# Patient Record
Sex: Female | Born: 1990 | Race: White | Hispanic: No | Marital: Married | Smoking: Never smoker
Health system: Southern US, Community
[De-identification: ages and names within clinical notes are randomized; demographics above are authoritative.]

## PROBLEM LIST (undated history)

## (undated) ENCOUNTER — Inpatient Hospital Stay: Payer: Self-pay

## (undated) DIAGNOSIS — F419 Anxiety disorder, unspecified: Secondary | ICD-10-CM

## (undated) DIAGNOSIS — F32A Depression, unspecified: Secondary | ICD-10-CM

## (undated) DIAGNOSIS — F329 Major depressive disorder, single episode, unspecified: Secondary | ICD-10-CM

## (undated) HISTORY — PX: TUBAL LIGATION: SHX77

## (undated) HISTORY — PX: CHOLECYSTECTOMY: SHX55

## (undated) HISTORY — PX: TONSILLECTOMY: SUR1361

## (undated) HISTORY — PX: WISDOM TOOTH EXTRACTION: SHX21

---
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
## 2010-04-27 ENCOUNTER — Emergency Department: Payer: Self-pay | Admitting: Unknown Physician Specialty

## 2010-09-06 ENCOUNTER — Emergency Department: Payer: Self-pay | Admitting: Emergency Medicine

## 2010-09-08 ENCOUNTER — Emergency Department: Payer: Self-pay | Admitting: Emergency Medicine

## 2010-09-19 ENCOUNTER — Emergency Department: Payer: Self-pay | Admitting: Emergency Medicine

## 2010-12-07 ENCOUNTER — Emergency Department: Payer: Self-pay | Admitting: Emergency Medicine

## 2011-01-08 ENCOUNTER — Emergency Department: Payer: Self-pay | Admitting: Unknown Physician Specialty

## 2011-01-29 NOTE — Op Note (Signed)
April Tran, April Tran             ACCOUNT NO.:  [id number]   MEDICAL RECORD NO.:  [id number]          PATIENT TYPE:  AMB   LOCATION:  SDS                          FACILITY:  [facility]   PHYSICIAN:  Jon Gills, M.D.  DATE OF BIRTH:  07/19/1991   DATE OF PROCEDURE:  09/20/2007  DATE OF DISCHARGE:  08/25/2007                               OPERATIVE REPORT   PREOPERATIVE DIAGNOSIS:  Abdominal pain.   POSTOPERATIVE DIAGNOSIS:  Abdominal pain.   OPERATION:  Upper GI endoscopy with biopsy.   SURGEON:  Jon Gills, M.D.   ASSISTANT:  None.   DESCRIPTION OF FINDINGS:  Following informed written consent, the  patient was taken to the operating room and placed under general  anesthesia with continuous cardiopulmonary monitoring.  She remained in  the supine position and the Pentax endoscope was inserted by mouth and  advanced without difficulty.  A competent lower esophageal sphincter was  identified 40 cm from the incisors.  There was no visual evidence for  esophagitis, gastritis, duodenitis or peptic ulcer disease.  A solitary  gastric biopsy was negative for Helicobacter by CLO testing.  Multiple  esophageal, gastric and duodenal biopsies were histologically normal.  The endoscope was gradually withdrawn and the patient was awakened and  taken to the recovery room in satisfactory condition.  She will be  released later today to the care of her family.   DESCRIPTION OF TECHNICAL PROCEDURES USED:  Pentax upper GI endoscope  with cold biopsy forceps.   DESCRIPTION OF SPECIMENS REMOVED:  Esophagus x3 in formalin, gastric x1  for CLO testing, gastric x3 in formalin and duodenum x3 in formalin.           ______________________________  Jon Gills, M.D.     [facility]  D:  09/20/2007  T:  09/20/2007  Job:  [id number]   cc:   Gillis Ends, MD

## 2011-03-01 ENCOUNTER — Emergency Department: Payer: Self-pay | Admitting: Emergency Medicine

## 2011-03-07 ENCOUNTER — Emergency Department: Payer: Self-pay | Admitting: Emergency Medicine

## 2011-05-09 ENCOUNTER — Emergency Department: Payer: Self-pay | Admitting: Emergency Medicine

## 2011-06-08 ENCOUNTER — Emergency Department: Payer: Self-pay | Admitting: Emergency Medicine

## 2011-06-21 LAB — CBC
HCT: 39.8
Hemoglobin: 13.5
MCHC: 33.9
MCV: 82.9
Platelets: 177
RBC: 4.8
RDW: 13.8
WBC: 6.4

## 2011-06-21 LAB — CLOTEST (H. PYLORI), BIOPSY: Helicobacter screen: NEGATIVE

## 2011-07-07 ENCOUNTER — Emergency Department: Payer: Self-pay | Admitting: Internal Medicine

## 2011-09-25 ENCOUNTER — Emergency Department: Payer: Self-pay | Admitting: Emergency Medicine

## 2011-10-02 ENCOUNTER — Emergency Department: Payer: Self-pay | Admitting: Emergency Medicine

## 2011-11-18 ENCOUNTER — Emergency Department: Payer: Self-pay | Admitting: Emergency Medicine

## 2011-11-18 LAB — CBC
HGB: 13.7 g/dL (ref 12.0–16.0)
MCV: 90 fL (ref 80–100)
Platelet: 128 10*3/uL — ABNORMAL LOW (ref 150–440)
RBC: 4.53 10*6/uL (ref 3.80–5.20)
WBC: 5.2 10*3/uL (ref 3.6–11.0)

## 2011-11-18 LAB — COMPREHENSIVE METABOLIC PANEL
Albumin: 3.7 g/dL (ref 3.4–5.0)
Alkaline Phosphatase: 48 U/L — ABNORMAL LOW (ref 50–136)
Anion Gap: 10 (ref 7–16)
BUN: 15 mg/dL (ref 7–18)
Glucose: 86 mg/dL (ref 65–99)
SGOT(AST): 29 U/L (ref 15–37)
SGPT (ALT): 38 U/L
Total Protein: 7.3 g/dL (ref 6.4–8.2)

## 2011-11-18 LAB — URINALYSIS, COMPLETE
Ketone: NEGATIVE
Nitrite: NEGATIVE
Ph: 5 (ref 4.5–8.0)
Protein: NEGATIVE
Specific Gravity: 1.027 (ref 1.003–1.030)

## 2012-02-25 ENCOUNTER — Encounter (HOSPITAL_COMMUNITY): Payer: Self-pay | Admitting: *Deleted

## 2012-02-25 ENCOUNTER — Emergency Department (HOSPITAL_COMMUNITY)
Admission: EM | Admit: 2012-02-25 | Discharge: 2012-02-26 | Disposition: A | Discharge status: Home / Self Care | Payer: Medicaid Other | Attending: Emergency Medicine | Admitting: Emergency Medicine

## 2012-02-25 DIAGNOSIS — Z3490 Encounter for supervision of normal pregnancy, unspecified, unspecified trimester: Secondary | ICD-10-CM

## 2012-02-25 DIAGNOSIS — Z3201 Encounter for pregnancy test, result positive: Secondary | ICD-10-CM | POA: Insufficient documentation

## 2012-02-25 DIAGNOSIS — R109 Unspecified abdominal pain: Secondary | ICD-10-CM | POA: Insufficient documentation

## 2012-02-25 DIAGNOSIS — R110 Nausea: Secondary | ICD-10-CM | POA: Insufficient documentation

## 2012-02-25 LAB — POCT PREGNANCY, URINE: Preg Test, Ur: POSITIVE — AB

## 2012-02-25 NOTE — ED Notes (Signed)
Pt worried about pregnancy, pt states she has been nauseated, and having abdominal pain for 3 weeks. Pt states last known period was 2 months ago. Pt states also tired.

## 2012-02-26 ENCOUNTER — Emergency Department (HOSPITAL_COMMUNITY): Payer: Medicaid Other

## 2012-02-26 LAB — CBC
MCH: 29.8 pg (ref 26.0–34.0)
MCHC: 34.9 g/dL (ref 30.0–36.0)
MCV: 85.5 fL (ref 78.0–100.0)
Platelets: 147 10*3/uL — ABNORMAL LOW (ref 150–400)
RDW: 12.3 % (ref 11.5–15.5)

## 2012-02-26 LAB — POCT I-STAT, CHEM 8
Calcium, Ion: 1.23 mmol/L (ref 1.12–1.32)
HCT: 41 % (ref 36.0–46.0)
TCO2: 24 mmol/L (ref 0–100)

## 2012-02-26 LAB — HCG, QUANTITATIVE, PREGNANCY: hCG, Beta Chain, Quant, S: 29783 m[IU]/mL — ABNORMAL HIGH (ref <5)

## 2012-02-26 MED ORDER — SODIUM CHLORIDE 0.9 % IV BOLUS (SEPSIS)
1000.0000 mL | Freq: Once | INTRAVENOUS | Status: AC
  Administered 2012-02-26: 1000 mL via INTRAVENOUS

## 2012-02-26 NOTE — Discharge Instructions (Signed)
Start prenatal vitamins as discussed. Call in the morning to establish followup with OB/GYN.  ABCs of Pregnancy  A  Antepartum care is very important. Be sure you see your doctor and get prenatal care as soon as you think you are pregnant. At this time, you will be tested for infection, genetic abnormalities and potential problems with you and the pregnancy. This is the time to discuss diet, exercise, work, medications, labor, pain medication during labor and the possibility of a cesarean delivery. Ask any questions that may concern you. It is important to see your doctor regularly throughout your pregnancy. Avoid exposure to toxic substances and chemicals - such as cleaning solvents, lead and mercury, some insecticides, and paint. Pregnant women should avoid exposure to paint fumes, and fumes that cause you to feel ill, dizzy or faint. When possible, it is a good idea to have a pre-pregnancy consultation with your caregiver to begin some important recommendations your caregiver suggests such as, taking folic acid, exercising, quitting smoking, avoiding alcoholic beverages, etc.  B  Breastfeeding is the healthiest choice for both you and your baby. It has many nutritional benefits for the baby and health benefits for the mother. It also creates a very tight and loving bond between the baby and mother. Talk to your doctor, your family and friends, and your employer about how you choose to feed your baby and how they can support you in your decision. Not all birth defects can be prevented, but a woman can take actions that may increase her chance of having a healthy baby. Many birth defects happen very early in pregnancy, sometimes before a woman even knows she is pregnant. Birth defects or abnormalities of any child in your or the father's family should be discussed with your caregiver. Get a good support bra as your breast size changes. Wear it especially when you exercise and when nursing.  C  Celebrate the  news of your pregnancy with the your spouse/father and family. Childbirth classes are helpful to take for you and the spouse/father because it helps to understand what happens during the pregnancy, labor and delivery. Cesarean delivery should be discussed with your doctor so you are prepared for that possibility. The pros and cons of circumcision if it is a boy, should be discussed with your pediatrician. Cigarette smoking during pregnancy can result in low birth weight babies. It has been associated with infertility, miscarriages, tubal pregnancies, infant death (mortality) and poor health (morbidity) in childhood. Additionally, cigarette smoking may cause long-term learning disabilities. If you smoke, you should try to quit before getting pregnant and not smoke during the pregnancy. Secondary smoke may also harm a mother and her developing baby. It is a good idea to ask people to stop smoking around you during your pregnancy and after the baby is born. Extra calcium is necessary when you are pregnant and is found in your prenatal vitamin, in dairy products, green leafy vegetables and in calcium supplements.  D  A healthy diet according to your current weight and height, along with vitamins and mineral supplements should be discussed with your caregiver. Domestic abuse or violence should be made known to your doctor right away to get the situation corrected. Drink more water when you exercise to keep hydrated. Discomfort of your back and legs usually develops and progresses from the middle of the second trimester through to delivery of the baby. This is because of the enlarging baby and uterus, which may also affect your balance. Do not  take illegal drugs. Illegal drugs can seriously harm the baby and you. Drink extra fluids (water is best) throughout pregnancy to help your body keep up with the increases in your blood volume. Drink at least 6 to 8 glasses of water, fruit juice, or milk each day. A good way to  know you are drinking enough fluid is when your urine looks almost like clear water or is very light yellow.  E  Eat healthy to get the nutrients you and your unborn baby need. Your meals should include the five basic food groups. Exercise (30 minutes of light to moderate exercise a day) is important and encouraged during pregnancy, if there are no medical problems or problems with the pregnancy. Exercise that causes discomfort or dizziness should be stopped and reported to your caregiver. Emotions during pregnancy can change from being ecstatic to depression and should be understood by you, your partner and your family.  F  Fetal screening with ultrasound, amniocentesis and monitoring during pregnancy and labor is common and sometimes necessary. Take 400 micrograms of folic acid daily both before, when possible, and during the first few months of pregnancy to reduce the risk of birth defects of the brain and spine. All women who could possibly become pregnant should take a vitamin with folic acid, every day. It is also important to eat a healthy diet with fortified foods (enriched grain products, including cereals, rice, breads, and pastas) and foods with natural sources of folate (orange juice, green leafy vegetables, beans, peanuts, broccoli, asparagus, peas, and lentils). The father should be involved with all aspects of the pregnancy including, the prenatal care, childbirth classes, labor, delivery, and postpartum time. Fathers may also have emotional concerns about being a father, financial needs, and raising a family.  G  Genetic testing should be done appropriately. It is important to know your family and the father's history. If there have been problems with pregnancies or birth defects in your family, report these to your doctor. Also, genetic counselors can talk with you about the information you might need in making decisions about having a family. You can call a major medical center in your area  for help in finding a board-certified genetic counselor. Genetic testing and counseling should be done before pregnancy when possible, especially if there is a history of problems in the mother's or father's family. Certain ethnic backgrounds are more at risk for genetic defects.  H  Get familiar with the hospital where you will be having your baby. Get to know how long it takes to get there, the labor and delivery area, and the hospital procedures. Be sure your medical insurance is accepted there. Get your home ready for the baby including, clothes, the baby's room (when possible), furniture and car seat. Hand washing is important throughout the day, especially after handling raw meat and poultry, changing the baby's diaper or using the bathroom. This can help prevent the spread of many bacteria and viruses that cause infection. Your hair may become dry and thinner, but will return to normal a few weeks after the baby is born. Heartburn is a common problem that can be treated by taking antacids recommended by your caregiver, eating smaller meals 5 or 6 times a day, not drinking liquids when eating, drinking between meals and raising the head of your bed 2 to 3 inches.  I  Insurance to cover you, the baby, doctor and hospital should be reviewed so that you will be prepared to pay any costs  not covered by your insurance plan. If you do not have medical insurance, there are usually clinics and services available for you in your community. Take 30 milligrams of iron during your pregnancy as prescribed by your doctor to reduce the risk of low red blood cells (anemia) later in pregnancy. All women of childbearing age should eat a diet rich in iron.  J  There should be a joint effort for the mother, father and any other children to adapt to the pregnancy financially, emotionally, and psychologically during the pregnancy. Join a support group for moms-to-be. Or, join a class on parenting or childbirth. Have the  family participate when possible.  K  Know your limits. Let your caregiver know if you experience any of the following:  Pain of any kind.  Strong cramps.  You develop a lot of weight in a short period of time (5 pounds in 3 to 5 days).  Vaginal bleeding, leaking of amniotic fluid.  Headache, vision problems.  Dizziness, fainting, shortness of breath.  Chest pain.  Fever of 102 F (38.9 C) or higher.  Gush of clear fluid from your vagina.  Painful urination.  Domestic violence.  Irregular heartbeat (palpitations).  Rapid beating of the heart (tachycardia).  Constant feeling sick to your stomach (nauseous) and vomiting.  Trouble walking, fluid retention (edema).  Muscle weakness.  If your baby has decreased activity.  Persistent diarrhea.  Abnormal vaginal discharge.  Uterine contractions at 20-minute intervals.  Back pain that travels down your leg.  L  Learn and practice that what you eat and drink should be in moderation and healthy for you and your baby. Legal drugs such as alcohol and caffeine are important issues for pregnant women. There is no safe amount of alcohol a woman can drink while pregnant. Fetal alcohol syndrome, a disorder characterized by growth retardation, facial abnormalities, and central nervous system dysfunction, is caused by a woman's use of alcohol during pregnancy. Caffeine, found in tea, coffee, soft drinks and chocolate, should also be limited. Be sure to read labels when trying to cut down on caffeine during pregnancy. More than 200 foods, beverages, and over-the-counter medications contain caffeine and have a high salt content! There are coffees and teas that do not contain caffeine.  M  Medical conditions such as diabetes, epilepsy, and high blood pressure should be treated and kept under control before pregnancy when possible, but especially during pregnancy. Ask your caregiver about any medications that may need to be changed or adjusted during pregnancy.  If you are currently taking any medications, ask your caregiver if it is safe to take them while you are pregnant or before getting pregnant when possible. Also, be sure to discuss any herbs or vitamins you are taking. They are medicines, too! Discuss with your doctor all medications, prescribed and over-the-counter, that you are taking. During your prenatal visit, discuss the medications your doctor may give you during labor and delivery.  N  Never be afraid to ask your doctor or caregiver questions about your health, the progress of the pregnancy, family problems, stressful situations, and recommendation for a pediatrician, if you do not have one. It is better to take all precautions and discuss any questions or concerns you may have during your office visits. It is a good idea to write down your questions before you visit the doctor.  O  Over-the-counter cough and cold remedies may contain alcohol or other ingredients that should be avoided during pregnancy. Ask your caregiver about prescription,  herbs or over-the-counter medications that you are taking or may consider taking while pregnant.  P  Physical activity during pregnancy can benefit both you and your baby by lessening discomfort and fatigue, providing a sense of well-being, and increasing the likelihood of early recovery after delivery. Light to moderate exercise during pregnancy strengthens the belly (abdominal) and back muscles. This helps improve posture. Practicing yoga, walking, swimming, and cycling on a stationary bicycle are usually safe exercises for pregnant women. Avoid scuba diving, exercise at high altitudes (over 3000 feet), skiing, horseback riding, contact sports, etc. Always check with your doctor before beginning any kind of exercise, especially during pregnancy and especially if you did not exercise before getting pregnant.  Q  Queasiness, stomach upset and morning sickness are common during pregnancy. Eating a couple of  crackers or dry toast before getting out of bed. Foods that you normally love may make you feel sick to your stomach. You may need to substitute other nutritious foods. Eating 5 or 6 small meals a day instead of 3 large ones may make you feel better. Do not drink with your meals, drink between meals. Questions that you have should be written down and asked during your prenatal visits.  R  Read about and make plans to baby-proof your home. There are important tips for making your home a safer environment for your baby. Review the tips and make your home safer for you and your baby. Read food labels regarding calories, salt and fat content in the food.  S  Saunas, hot tubs, and steam rooms should be avoided while you are pregnant. Excessive high heat may be harmful during your pregnancy. Your caregiver will screen and examine you for sexually transmitted diseases and genetic disorders during your prenatal visits. Learn the signs of labor. Sexual relations while pregnant is safe unless there is a medical or pregnancy problem and your caregiver advises against it.  T  Traveling long distances should be avoided especially in the third trimester of your pregnancy. If you do have to travel out of state, be sure to take a copy of your medical records and medical insurance plan with you. You should not travel long distances without seeing your doctor first. Most airlines will not allow you to travel after 36 weeks of pregnancy. Toxoplasmosis is an infection caused by a parasite that can seriously harm an unborn baby. Avoid eating undercooked meat and handling cat litter. Be sure to wear gloves when gardening. Tingling of the hands and fingers is not unusual and is due to fluid retention. This will go away after the baby is born.  U  Womb (uterus) size increases during the first trimester. Your kidneys will begin to function more efficiently. This may cause you to feel the need to urinate more often. You may also leak  urine when sneezing, coughing or laughing. This is due to the growing uterus pressing against your bladder, which lies directly in front of and slightly under the uterus during the first few months of pregnancy. If you experience burning along with frequency of urination or bloody urine, be sure to tell your doctor. The size of your uterus in the third trimester may cause a problem with your balance. It is advisable to maintain good posture and avoid wearing high heels during this time. An ultrasound of your baby may be necessary during your pregnancy and is safe for you and your baby.  V  Vaccinations are an important concern for pregnant women. Get  needed vaccines before pregnancy. Center for Disease Control ([employer]) has clear guidelines for the use of vaccines during pregnancy. Review the list, be sure to discuss it with your doctor. Prenatal vitamins are helpful and healthy for you and the baby. Do not take extra vitamins except what is recommended. Taking too much of certain vitamins can cause overdose problems. Continuous vomiting should be reported to your caregiver. Varicose veins may appear especially if there is a family history of varicose veins. They should subside after the delivery of the baby. Support hose helps if there is leg discomfort.  W  Being overweight or underweight during pregnancy may cause problems. Try to get within 15 pounds of your ideal weight before pregnancy. Remember, pregnancy is not a time to be dieting! Do not stop eating or start skipping meals as your weight increases. Both you and your baby need the calories and nutrition you receive from a healthy diet. Be sure to consult with your doctor about your diet. There is a formula and diet plan available depending on whether you are overweight or underweight. Your caregiver or nutritionist can help and advise you if necessary.  X  Avoid X-rays. If you must have dental work or diagnostic tests, tell your dentist or  physician that you are pregnant so that extra care can be taken. X-rays should only be taken when the risks of not taking them outweigh the risk of taking them. If needed, only the minimum amount of radiation should be used. When X-rays are necessary, protective lead shields should be used to cover areas of the body that are not being X-rayed.  Y  Your baby loves you. Breastfeeding your baby creates a loving and very close bond between the two of you. Give your baby a healthy environment to live in while you are pregnant. Infants and children require constant care and guidance. Their health and safety should be carefully watched at all times. After the baby is born, rest or take a nap when the baby is sleeping.  Z  Get your ZZZs. Be sure to get plenty of rest. Resting on your side as often as possible, especially on your left side is advised. It provides the best circulation to your baby and helps reduce swelling. Try taking a nap for 30 to 45 minutes in the afternoon when possible. After the baby is born rest or take a nap when the baby is sleeping. Try elevating your feet for that amount of time when possible. It helps the circulation in your legs and helps reduce swelling.

## 2012-02-26 NOTE — ED Provider Notes (Signed)
History     CSN: [id number]  Arrival date & time 02/25/12  2147   First MD Initiated Contact with Patient 02/26/12 0057      Chief Complaint  Patient presents with  . Abdominal Pain  . Possible Pregnancy    (Consider location/radiation/quality/duration/timing/severity/associated sxs/prior treatment) HPI History provided by patient. Has been having lower abdominal discomfort for about the last 3 weeks. Described as dull in quality located suprapubic region and does not lateralize to right or left side.  He should is sexually active and believes that she may be pregnant. LMP about a month ago but is uncertain what date. No vaginal bleeding. No vaginal discharge. No fevers or chills. No back pain. No vomiting or diarrhea but has had some nausea. Symptoms mild to moderate in severity. No history of same. G0P0. History reviewed. No pertinent past medical history.  History reviewed. No pertinent past surgical history.  History reviewed. No pertinent family history.  History  Substance Use Topics  . Smoking status: Never Smoker   . Smokeless tobacco: Not on file  . Alcohol Use: No    OB History    Grav Para Term Preterm Abortions TAB SAB Ect Mult Living                  Review of Systems  Constitutional: Negative for fever and chills.  HENT: Negative for neck pain and neck stiffness.   Eyes: Negative for pain.  Respiratory: Negative for shortness of breath.   Cardiovascular: Negative for chest pain.  Gastrointestinal: Positive for nausea. Negative for vomiting, constipation, blood in stool, abdominal distention and anal bleeding.  Genitourinary: Negative for dysuria.  Musculoskeletal: Negative for back pain.  Skin: Negative for rash.  Neurological: Negative for headaches.  All other systems reviewed and are negative.    Allergies  Review of patient's allergies indicates no known allergies.  Home Medications   Current Outpatient Rx  Name Route Sig Dispense Refill  .  PRESCRIPTION MEDICATION Oral Take 1 tablet by mouth daily. Birth control (norno?)      BP 129/81  Pulse 84  Temp 98.2 F (36.8 C) (Oral)  Resp 18  SpO2 100%  Physical Exam  Constitutional: She is oriented to person, place, and time. She appears well-developed and well-nourished.  HENT:  Head: Normocephalic and atraumatic.  Eyes: Conjunctivae and EOM are normal. Pupils are equal, round, and reactive to light.  Neck: Trachea normal. Neck supple. No thyromegaly present.  Cardiovascular: Normal rate, regular rhythm, S1 normal, S2 normal and normal pulses.     No systolic murmur is present   No diastolic murmur is present  Pulses:      Radial pulses are 2+ on the right side, and 2+ on the left side.  Pulmonary/Chest: Effort normal and breath sounds normal. She has no wheezes. She has no rhonchi. She has no rales. She exhibits no tenderness.  Abdominal: Soft. Normal appearance and bowel sounds are normal. There is no tenderness. There is no CVA tenderness and negative Murphy's sign.  Musculoskeletal:       BLE:s Calves nontender, no cords or erythema, negative Homans sign  Neurological: She is alert and oriented to person, place, and time. She has normal strength. No cranial nerve deficit or sensory deficit. GCS eye subscore is 4. GCS verbal subscore is 5. GCS motor subscore is 6.  Skin: Skin is warm and dry. No rash noted. She is not diaphoretic.  Psychiatric: Her speech is normal.  Cooperative and appropriate    ED Course  Procedures (including critical care time)  Labs Reviewed  POCT PREGNANCY, URINE - Abnormal; Notable for the following:    Preg Test, Ur POSITIVE (*)     All other components within normal limits  HCG, QUANTITATIVE, PREGNANCY - Abnormal; Notable for the following:    hCG, Beta Chain, [place] [address] (*)     All other components within normal limits  CBC - Abnormal; Notable for the following:    Platelets 147 (*)     All other components within normal  limits  POCT I-STAT, CHEM 8 - Abnormal; Notable for the following:    Potassium 3.4 (*)     All other components within normal limits  ABO/RH   US Ob Limited  02/26/2012  *RADIOLOGY REPORT*  Clinical Data: Abdominal pain.  Positive urine pregnancy test.  OBSTETRIC <14 WK [place] AND TRANSVAGINAL OB US  Technique:  Both transabdominal and transvaginal ultrasound examinations were performed for complete evaluation of the gestation as well as the maternal uterus, adnexal regions, and pelvic cul-de-sac.  Transvaginal technique was performed to assess early pregnancy.  Comparison:  None.  Intrauterine gestational sac:  Visualized/normal in shape. Yolk sac: Yes Embryo: Yes Cardiac Activity: Yes Heart Rate: 113 bpm CRL: 8.3   mm  6   w  6   d         [place] EDC: 10/15/2012  Maternal uterus/adnexae: The left ovary is normal.  The right ovary is not visible.  No definitive subchorionic hemorrhage.  IMPRESSION: Intrauterine pregnancy of approximally 6 weeks 6 days gestation.  Original Report Authenticated By: Gwynn Burly, M.D.   US Ob Transvaginal  02/26/2012  *RADIOLOGY REPORT*  Clinical Data: Abdominal pain.  Positive urine pregnancy test.  OBSTETRIC <14 WK [place] AND TRANSVAGINAL OB US  Technique:  Both transabdominal and transvaginal ultrasound examinations were performed for complete evaluation of the gestation as well as the maternal uterus, adnexal regions, and pelvic cul-de-sac.  Transvaginal technique was performed to assess early pregnancy.  Comparison:  None.  Intrauterine gestational sac:  Visualized/normal in shape. Yolk sac: Yes Embryo: Yes Cardiac Activity: Yes Heart Rate: 113 bpm CRL: 8.3   mm  6   w  6   d         [place] EDC: 10/15/2012  Maternal uterus/adnexae: The left ovary is normal.  The right ovary is not visible.  No definitive subchorionic hemorrhage.  IMPRESSION: Intrauterine pregnancy of approximally 6 weeks 6 days gestation.  Original Report Authenticated By: Gwynn Burly, M.D.    IV  established. Vital signs within normal range. Labs and U preg obtained and reviewed as above.  Ultrasound ordered and reviewed as above. MDM   Lower abdominal discomfort in a patient with positive pregnancy test. Pelvic ultrasound obtained and has IUP without ectopic. Patient lives in [place] and has primary care physician there. She agrees to followup with local OB/GYN and start prenatal vitamins. She is stable for discharge home. She has no right lower quadrant tenderness or indication for further workup at this time.        Sunnie Nielsen, MD 02/26/12 [phone]

## 2012-02-26 NOTE — ED Notes (Signed)
Pt discharged via teach back. 

## 2012-02-26 NOTE — ED Notes (Signed)
Pt c/o sharp epigastric pain x 3 weeks. States she has not sought treatment until today and she's worried she may be pregnant. Denies vaginal bleeding or lower abdominal cramping. Denies n/v. Pt resting with family at bedside

## 2012-03-31 ENCOUNTER — Emergency Department: Payer: Self-pay | Admitting: *Deleted

## 2012-03-31 LAB — URINALYSIS, COMPLETE
Bacteria: NONE SEEN
Blood: NEGATIVE
Nitrite: NEGATIVE
Protein: NEGATIVE
Specific Gravity: 1.014 (ref 1.003–1.030)

## 2012-03-31 LAB — CBC
MCH: 30 pg (ref 26.0–34.0)
MCHC: 34.2 g/dL (ref 32.0–36.0)
MCV: 88 fL (ref 80–100)
RDW: 13.6 % (ref 11.5–14.5)

## 2012-03-31 LAB — COMPREHENSIVE METABOLIC PANEL
Albumin: 3.8 g/dL (ref 3.4–5.0)
Anion Gap: 10 (ref 7–16)
BUN: 8 mg/dL (ref 7–18)
Calcium, Total: 9.6 mg/dL (ref 8.5–10.1)
EGFR (African American): >60
Glucose: 80 mg/dL (ref 65–99)
Potassium: 3.5 mmol/L (ref 3.5–5.1)
SGOT(AST): 14 U/L — ABNORMAL LOW (ref 15–37)
SGPT (ALT): 20 U/L
Total Protein: 7.9 g/dL (ref 6.4–8.2)

## 2012-03-31 LAB — PREGNANCY, URINE: Pregnancy Test, Urine: POSITIVE m[IU]/mL

## 2012-05-01 ENCOUNTER — Emergency Department: Payer: Self-pay | Admitting: Emergency Medicine

## 2012-05-01 LAB — URINALYSIS, COMPLETE
Blood: NEGATIVE
Nitrite: NEGATIVE
Ph: 7 (ref 4.5–8.0)
Protein: 30

## 2012-05-01 LAB — CBC
HCT: 38.9 % (ref 35.0–47.0)
HGB: 13.8 g/dL (ref 12.0–16.0)
MCV: 87 fL (ref 80–100)
Platelet: 129 10*3/uL — ABNORMAL LOW (ref 150–440)
RBC: 4.45 10*6/uL (ref 3.80–5.20)
RDW: 13.8 % (ref 11.5–14.5)
WBC: 11.8 10*3/uL — ABNORMAL HIGH (ref 3.6–11.0)

## 2012-05-01 LAB — BASIC METABOLIC PANEL
Anion Gap: 8 (ref 7–16)
BUN: 8 mg/dL (ref 7–18)
Creatinine: 0.79 mg/dL (ref 0.60–1.30)
EGFR (African American): >60
EGFR (Non-African Amer.): >60
Glucose: 92 mg/dL (ref 65–99)
Sodium: 138 mmol/L (ref 136–145)

## 2012-07-13 ENCOUNTER — Observation Stay: Payer: Self-pay | Admitting: Obstetrics and Gynecology

## 2012-07-13 LAB — URINALYSIS, COMPLETE
Bacteria: NONE SEEN
Bilirubin,UR: NEGATIVE
Blood: NEGATIVE
Glucose,UR: NEGATIVE mg/dL (ref 0–75)
Nitrite: NEGATIVE
Protein: NEGATIVE
RBC,UR: NONE SEEN /HPF (ref 0–5)
WBC UR: 1 /HPF (ref 0–5)

## 2012-08-19 ENCOUNTER — Observation Stay: Payer: Self-pay

## 2012-08-19 LAB — URINALYSIS, COMPLETE
Glucose,UR: >=500 mg/dL (ref 0–75)
Leukocyte Esterase: NEGATIVE
Nitrite: NEGATIVE
WBC UR: 4 /HPF (ref 0–5)

## 2012-08-30 ENCOUNTER — Observation Stay: Payer: Self-pay

## 2012-08-30 LAB — URINALYSIS, COMPLETE
Bacteria: NONE SEEN
Glucose,UR: 150 mg/dL (ref 0–75)
Ketone: NEGATIVE
Protein: NEGATIVE
RBC,UR: <1 /HPF (ref 0–5)
Specific Gravity: 1.01 (ref 1.003–1.030)
WBC UR: 3 /HPF (ref 0–5)

## 2012-10-14 ENCOUNTER — Inpatient Hospital Stay: Payer: Self-pay

## 2012-10-14 LAB — CBC WITH DIFFERENTIAL/PLATELET
Basophil #: 0 10*3/uL (ref 0.0–0.1)
Eosinophil #: 0.1 10*3/uL (ref 0.0–0.7)
HGB: 12.9 g/dL (ref 12.0–16.0)
Lymphocyte #: 1.8 10*3/uL (ref 1.0–3.6)
MCHC: 34.4 g/dL (ref 32.0–36.0)
Monocyte #: 0.8 x10 3/mm (ref 0.2–0.9)
Neutrophil #: 11.9 10*3/uL — ABNORMAL HIGH (ref 1.4–6.5)
Neutrophil %: 81.4 %
Platelet: 145 10*3/uL — ABNORMAL LOW (ref 150–440)
RBC: 4.19 10*6/uL (ref 3.80–5.20)
RDW: 12.6 % (ref 11.5–14.5)
WBC: 14.6 10*3/uL — ABNORMAL HIGH (ref 3.6–11.0)

## 2012-10-15 LAB — HEMATOCRIT: HCT: 28.2 % — ABNORMAL LOW (ref 35.0–47.0)

## 2012-12-29 ENCOUNTER — Emergency Department: Payer: Self-pay | Admitting: Emergency Medicine

## 2013-03-19 ENCOUNTER — Emergency Department: Payer: Self-pay | Admitting: Emergency Medicine

## 2013-05-01 ENCOUNTER — Emergency Department: Payer: Self-pay | Admitting: Emergency Medicine

## 2013-05-01 LAB — CBC
HCT: 39.2 % (ref 35.0–47.0)
MCV: 81 fL (ref 80–100)
RBC: 4.87 10*6/uL (ref 3.80–5.20)
RDW: 14.2 % (ref 11.5–14.5)
WBC: 7.8 10*3/uL (ref 3.6–11.0)

## 2013-05-01 LAB — BASIC METABOLIC PANEL
Anion Gap: 5 — ABNORMAL LOW (ref 7–16)
BUN: 14 mg/dL (ref 7–18)
Co2: 27 mmol/L (ref 21–32)
Creatinine: 0.9 mg/dL (ref 0.60–1.30)
Glucose: 82 mg/dL (ref 65–99)
Sodium: 139 mmol/L (ref 136–145)

## 2013-05-01 LAB — TROPONIN I: Troponin-I: <0.02 ng/mL

## 2013-08-01 ENCOUNTER — Emergency Department: Payer: Self-pay | Admitting: Internal Medicine

## 2013-08-01 LAB — COMPREHENSIVE METABOLIC PANEL
Alkaline Phosphatase: 101 U/L (ref 50–136)
Anion Gap: 5 — ABNORMAL LOW (ref 7–16)
BUN: 12 mg/dL (ref 7–18)
Creatinine: 0.95 mg/dL (ref 0.60–1.30)
EGFR (African American): >60
EGFR (Non-African Amer.): >60
Potassium: 3.8 mmol/L (ref 3.5–5.1)
SGOT(AST): 21 U/L (ref 15–37)
Sodium: 139 mmol/L (ref 136–145)

## 2013-08-01 LAB — CBC
HCT: 43.2 % (ref 35.0–47.0)
HGB: 14.7 g/dL (ref 12.0–16.0)
MCH: 28.4 pg (ref 26.0–34.0)
MCHC: 34.1 g/dL (ref 32.0–36.0)
RDW: 13.6 % (ref 11.5–14.5)
WBC: 7.5 10*3/uL (ref 3.6–11.0)

## 2013-08-01 LAB — PREGNANCY, URINE: Pregnancy Test, Urine: NEGATIVE m[IU]/mL

## 2013-08-01 LAB — URINALYSIS, COMPLETE
Bilirubin,UR: NEGATIVE
Ketone: NEGATIVE
Ph: 7 (ref 4.5–8.0)
Protein: 30

## 2013-08-01 LAB — WET PREP, GENITAL

## 2013-10-27 ENCOUNTER — Emergency Department: Payer: Self-pay | Admitting: Emergency Medicine

## 2013-12-19 ENCOUNTER — Emergency Department: Payer: Self-pay | Admitting: Emergency Medicine

## 2014-04-20 ENCOUNTER — Emergency Department: Payer: Self-pay | Admitting: Emergency Medicine

## 2014-04-20 LAB — GC/CHLAMYDIA PROBE AMP

## 2014-04-20 LAB — CBC WITH DIFFERENTIAL/PLATELET
BASOS ABS: 0 10*3/uL (ref 0.0–0.1)
Basophil %: 0.5 %
EOS ABS: 0.1 10*3/uL (ref 0.0–0.7)
EOS PCT: 1.3 %
HCT: 43.1 % (ref 35.0–47.0)
HGB: 14.4 g/dL (ref 12.0–16.0)
LYMPHS PCT: 33.3 %
Lymphocyte #: 2.5 10*3/uL (ref 1.0–3.6)
MCH: 28.9 pg (ref 26.0–34.0)
MCHC: 33.5 g/dL (ref 32.0–36.0)
MCV: 86 fL (ref 80–100)
MONO ABS: 0.5 x10 3/mm (ref 0.2–0.9)
MONOS PCT: 6.3 %
Neutrophil #: 4.4 10*3/uL (ref 1.4–6.5)
Neutrophil %: 58.6 %
Platelet: 154 10*3/uL (ref 150–440)
RBC: 5.01 10*6/uL (ref 3.80–5.20)
RDW: 12.8 % (ref 11.5–14.5)
WBC: 7.5 10*3/uL (ref 3.6–11.0)

## 2014-04-20 LAB — URINALYSIS, COMPLETE
BLOOD: NEGATIVE
Bacteria: NONE SEEN
Bilirubin,UR: NEGATIVE
Glucose,UR: NEGATIVE mg/dL (ref 0–75)
KETONE: NEGATIVE
LEUKOCYTE ESTERASE: NEGATIVE
NITRITE: NEGATIVE
Ph: 5 (ref 4.5–8.0)
Protein: NEGATIVE
Specific Gravity: 1.024 (ref 1.003–1.030)
WBC UR: 2 /HPF (ref 0–5)

## 2014-04-20 LAB — COMPREHENSIVE METABOLIC PANEL
ALBUMIN: 3.4 g/dL (ref 3.4–5.0)
ANION GAP: 6 — AB (ref 7–16)
AST: 13 U/L — AB (ref 15–37)
Alkaline Phosphatase: 85 U/L
BILIRUBIN TOTAL: 0.3 mg/dL (ref 0.2–1.0)
BUN: 10 mg/dL (ref 7–18)
CALCIUM: 9.3 mg/dL (ref 8.5–10.1)
CHLORIDE: 105 mmol/L (ref 98–107)
CO2: 26 mmol/L (ref 21–32)
CREATININE: 0.97 mg/dL (ref 0.60–1.30)
EGFR (African American): >60
Glucose: 91 mg/dL (ref 65–99)
Osmolality: 272 (ref 275–301)
Potassium: 3.7 mmol/L (ref 3.5–5.1)
SGPT (ALT): 16 U/L
SODIUM: 137 mmol/L (ref 136–145)
TOTAL PROTEIN: 7.7 g/dL (ref 6.4–8.2)

## 2014-04-20 LAB — WET PREP, GENITAL

## 2014-05-31 ENCOUNTER — Emergency Department: Payer: Self-pay | Admitting: Emergency Medicine

## 2014-05-31 LAB — COMPREHENSIVE METABOLIC PANEL
ALT: 15 U/L
ANION GAP: 9 (ref 7–16)
AST: 21 U/L (ref 15–37)
Albumin: 3.1 g/dL — ABNORMAL LOW (ref 3.4–5.0)
Alkaline Phosphatase: 75 U/L
BILIRUBIN TOTAL: 0.3 mg/dL (ref 0.2–1.0)
BUN: 10 mg/dL (ref 7–18)
CALCIUM: 8.8 mg/dL (ref 8.5–10.1)
CHLORIDE: 106 mmol/L (ref 98–107)
Co2: 24 mmol/L (ref 21–32)
Creatinine: 0.96 mg/dL (ref 0.60–1.30)
EGFR (African American): >60
EGFR (Non-African Amer.): >60
Glucose: 114 mg/dL — ABNORMAL HIGH (ref 65–99)
OSMOLALITY: 277 (ref 275–301)
Potassium: 3.7 mmol/L (ref 3.5–5.1)
SODIUM: 139 mmol/L (ref 136–145)
Total Protein: 7.3 g/dL (ref 6.4–8.2)

## 2014-05-31 LAB — CBC
HCT: 41.8 % (ref 35.0–47.0)
HGB: 13.8 g/dL (ref 12.0–16.0)
MCH: 28.5 pg (ref 26.0–34.0)
MCHC: 33 g/dL (ref 32.0–36.0)
MCV: 87 fL (ref 80–100)
PLATELETS: 162 10*3/uL (ref 150–440)
RBC: 4.83 10*6/uL (ref 3.80–5.20)
RDW: 13 % (ref 11.5–14.5)
WBC: 7.6 10*3/uL (ref 3.6–11.0)

## 2014-05-31 LAB — URINALYSIS, COMPLETE
BILIRUBIN, UR: NEGATIVE
Blood: NEGATIVE
Glucose,UR: NEGATIVE mg/dL (ref 0–75)
Ketone: NEGATIVE
LEUKOCYTE ESTERASE: NEGATIVE
NITRITE: NEGATIVE
PH: 5 (ref 4.5–8.0)
Protein: NEGATIVE
RBC,UR: 1 /HPF (ref 0–5)
Specific Gravity: 1.024 (ref 1.003–1.030)

## 2014-05-31 LAB — WET PREP, GENITAL

## 2014-05-31 LAB — GC/CHLAMYDIA PROBE AMP

## 2015-01-10 ENCOUNTER — Emergency Department: Admit: 2015-01-10 | Disposition: A | Discharge status: Home / Self Care | Payer: Self-pay | Admitting: Student

## 2015-01-10 LAB — URINALYSIS, COMPLETE
Bacteria: NONE SEEN
Bilirubin,UR: NEGATIVE
Blood: NEGATIVE
Glucose,UR: NEGATIVE mg/dL (ref 0–75)
KETONE: NEGATIVE
Leukocyte Esterase: NEGATIVE
Nitrite: NEGATIVE
PH: 6 (ref 4.5–8.0)
PROTEIN: NEGATIVE
Specific Gravity: 1.025 (ref 1.003–1.030)

## 2015-01-10 LAB — CBC WITH DIFFERENTIAL/PLATELET
Basophil #: 0 10*3/uL (ref 0.0–0.1)
Basophil %: 0.6 %
Eosinophil #: 0.2 10*3/uL (ref 0.0–0.7)
Eosinophil %: 3.3 %
HCT: 42.7 % (ref 35.0–47.0)
HGB: 14.3 g/dL (ref 12.0–16.0)
Lymphocyte #: 2.6 10*3/uL (ref 1.0–3.6)
Lymphocyte %: 35.1 %
MCH: 29.1 pg (ref 26.0–34.0)
MCHC: 33.5 g/dL (ref 32.0–36.0)
MCV: 87 fL (ref 80–100)
Monocyte #: 0.5 x10 3/mm (ref 0.2–0.9)
Monocyte %: 7.2 %
Neutrophil #: 3.9 10*3/uL (ref 1.4–6.5)
Neutrophil %: 53.8 %
Platelet: 172 10*3/uL (ref 150–440)
RBC: 4.92 10*6/uL (ref 3.80–5.20)
RDW: 13 % (ref 11.5–14.5)
WBC: 7.3 10*3/uL (ref 3.6–11.0)

## 2015-01-10 LAB — COMPREHENSIVE METABOLIC PANEL
ALBUMIN: 4.2 g/dL
ANION GAP: 6 — AB (ref 7–16)
AST: 21 U/L
Alkaline Phosphatase: 61 U/L
BUN: 11 mg/dL
Bilirubin,Total: 0.5 mg/dL
CALCIUM: 8.7 mg/dL — AB
CHLORIDE: 103 mmol/L
CO2: 25 mmol/L
Creatinine: 0.97 mg/dL
EGFR (African American): >60
EGFR (Non-African Amer.): >60
GLUCOSE: 100 mg/dL — AB
POTASSIUM: 3.9 mmol/L
SGPT (ALT): 16 U/L
Sodium: 134 mmol/L — ABNORMAL LOW
Total Protein: 8.1 g/dL

## 2015-01-10 LAB — LIPASE, BLOOD: LIPASE: 55 U/L — AB

## 2015-01-21 NOTE — H&P (Signed)
L&D Evaluation:  History:   HPI 24 yo G1 with EDD of 10/14/12 by a 10wk4 day ultrasound presents at 31 5/7 weeks with c/o constant lower abdominal cramping x 1 week. Pain aggravated by FM and her movement, and relieved with rest. She has a history of recurrent UTIs and is on Macrobid prophyllaxis. Has had +/- dysuria. Denies VB, LOF, vulvar itching. Has had nausea throu out pregnancy and heartburn/reflux. Had 2 loose stools yesterday-none today. Baby active. [facility], strep throat/scarlet fever in first trimester, an Enterococcus UTI,  a normal anatomy scan, and mild thrombocytopenia at 28 weeks (147K).    Presents with lower abdominal cramping    Patient's Medical History No Chronic Illness  migraines    Patient's Surgical History tonsillectomy    Medications Pre Natal Vitamins  Macrobid daily    Allergies NKDA    Social History none    Family History Non-Contributory   ROS:   ROS see HPI   Exam:   Vital Signs stable  135/70    Urine Protein large amt of glucose (ate Fruit Loops) but  FSBS=113. UA not  indicative of UTI.    General no apparent distress    Mental Status clear    Abdomen BS active, tenderness when palpating head in lower uterine segment, uterus soft.  Upper abdomen ND, generalized tenderness with deep palpation    Fetal Position cephalic    Pelvic no external lesions, cx: closed/25%/-3 and ballotable    Mebranes Intact    FHT normal rate with no decels, 135 with accels to 150s to 160s    FHT Description mod variability    Ucx absent    Skin dry   Impression:   Impression IUP at 31 5/7 weeks with discomforts of pregnancy.   Plan:   Plan Discharge home. Recommended maternity support garment.  RX for Zantac for GERD/heartburn 150 mgm BID.   Electronic Signatures: Trinna BalloonGutierrez, Annison Birchard L (CNM)  (Signed 07-Dec-13 14:49)  Authored: L&D Evaluation   Last Updated: 07-Dec-13 14:49 by Trinna BalloonGutierrez, Magdalene Tardiff L (CNM)

## 2015-01-21 NOTE — H&P (Signed)
L&D Evaluation:  History Expanded:   HPI 24 yo G1 with EDD of 10/14/12 by a 10wk4 day ultrasound presents at 33 4/7 weeks with multiple non-specific complaints including cramping, generalized pain, nausea, etc.+FM, no VB, no LOF. She has a history of recurrent UTIs and is on Macrobid prophyllaxis.  [facility], strep throat/scarlet fever in first trimester, an Enterococcus UTI,  a normal anatomy scan, and mild thrombocytopenia at 28 weeks (147K).    Presents with see above    Patient's Medical History No Chronic Illness  migraines    Patient's Surgical History tonsillectomy    Medications Pre Natal Vitamins  Macrobid daily    Allergies NKDA    Social History none    Family History Non-Contributory   ROS:   ROS see HPI   Exam:   Vital Signs stable  135/70    General no apparent distress    Mental Status clear    Pelvic no external lesions, cx: closed/25%/-3    Mebranes Intact    FHT normal rate with no decels, BL 130, mod variability, + accels    Ucx absent    Skin dry   Impression:   Impression IUP at 33 45/7 weeks with discomforts of pregnancy.   Plan:   Plan UA   Electronic Signatures: Nkosi Cortright, Marta Lamasamara K (CNM)  (Signed 18-Dec-13 22:40)  Authored: L&D Evaluation   Last Updated: 18-Dec-13 22:40 by Vella KohlerBrothers, Petina Muraski K (CNM)

## 2015-01-21 NOTE — H&P (Signed)
L&D Evaluation:  History Expanded:   HPI 24 yo G1 with EDD of 10/14/12, presents at 26 weeks with c/o low abdominal pain that shoots down her leg. +FM, no LOF or VB.    Patient's Medical History No Chronic Illness    Patient's Surgical History tonsillectomy    Medications Pre Natal Vitamins    Allergies NKDA    Social History none   ROS:   ROS see HPI   Exam:   Vital Signs stable    General no apparent distress    Mental Status clear    Abdomen gravid, non-tender    Pelvic no external lesions, cervix closed and thick    Mebranes Intact    FHT appropriate for gestational age    Ucx absent    Other UA negative for nitrites, leuk, ketones, RBC, WBC   Impression:   Impression discomforts of pregnancy, nerve pain   Plan:   Plan discharge    Comments Discussed comfort measures (heat, Tylenol, rest)    Follow Up Appointment already scheduled. 07/25/12   Electronic Signatures: Marta AntuBrothers, Meredith Mells K (CNM)  (Signed 31-Oct-13 08:34)  Authored: L&D Evaluation   Last Updated: 31-Oct-13 08:34 by Vella KohlerBrothers, Branden Shallenberger K (CNM)

## 2015-05-25 ENCOUNTER — Emergency Department: Payer: Medicaid Other

## 2015-05-25 ENCOUNTER — Other Ambulatory Visit: Payer: Self-pay

## 2015-05-25 ENCOUNTER — Emergency Department
Admission: EM | Admit: 2015-05-25 | Discharge: 2015-05-25 | Disposition: A | Discharge status: Home / Self Care | Payer: Self-pay | Attending: Emergency Medicine | Admitting: Emergency Medicine

## 2015-05-25 DIAGNOSIS — R1013 Epigastric pain: Secondary | ICD-10-CM | POA: Insufficient documentation

## 2015-05-25 DIAGNOSIS — Z79899 Other long term (current) drug therapy: Secondary | ICD-10-CM | POA: Insufficient documentation

## 2015-05-25 DIAGNOSIS — R110 Nausea: Secondary | ICD-10-CM | POA: Insufficient documentation

## 2015-05-25 DIAGNOSIS — Z87891 Personal history of nicotine dependence: Secondary | ICD-10-CM | POA: Insufficient documentation

## 2015-05-25 LAB — URINALYSIS COMPLETE WITH MICROSCOPIC (ARMC ONLY)
BACTERIA UA: NONE SEEN
Bilirubin Urine: NEGATIVE
GLUCOSE, UA: NEGATIVE mg/dL
Hgb urine dipstick: NEGATIVE
Ketones, ur: NEGATIVE mg/dL
Leukocytes, UA: NEGATIVE
NITRITE: NEGATIVE
PH: 5 (ref 5.0–8.0)
Protein, ur: NEGATIVE mg/dL
SPECIFIC GRAVITY, URINE: 1.019 (ref 1.005–1.030)

## 2015-05-25 LAB — CBC WITH DIFFERENTIAL/PLATELET
BASOS ABS: 0.1 10*3/uL (ref 0–0.1)
Basophils Relative: 1 %
EOS ABS: 0.2 10*3/uL (ref 0–0.7)
Eosinophils Relative: 2 %
HCT: 44.1 % (ref 35.0–47.0)
Hemoglobin: 14.8 g/dL (ref 12.0–16.0)
LYMPHS PCT: 42 %
Lymphs Abs: 4.1 10*3/uL — ABNORMAL HIGH (ref 1.0–3.6)
MCH: 29.1 pg (ref 26.0–34.0)
MCHC: 33.6 g/dL (ref 32.0–36.0)
MCV: 86.6 fL (ref 80.0–100.0)
Monocytes Absolute: 0.7 10*3/uL (ref 0.2–0.9)
Monocytes Relative: 7 %
Neutro Abs: 4.5 10*3/uL (ref 1.4–6.5)
Neutrophils Relative %: 48 %
Platelets: 178 10*3/uL (ref 150–440)
RBC: 5.09 MIL/uL (ref 3.80–5.20)
RDW: 12.5 % (ref 11.5–14.5)
WBC: 9.6 10*3/uL (ref 3.6–11.0)

## 2015-05-25 LAB — COMPREHENSIVE METABOLIC PANEL
ALBUMIN: 4.1 g/dL (ref 3.5–5.0)
ALT: 14 U/L (ref 14–54)
AST: 25 U/L (ref 15–41)
Alkaline Phosphatase: 64 U/L (ref 38–126)
Anion gap: 11 (ref 5–15)
BUN: 10 mg/dL (ref 6–20)
CHLORIDE: 106 mmol/L (ref 101–111)
CO2: 22 mmol/L (ref 22–32)
CREATININE: 0.95 mg/dL (ref 0.44–1.00)
Calcium: 9.3 mg/dL (ref 8.9–10.3)
GFR calc Af Amer: >60 mL/min (ref >60)
GLUCOSE: 110 mg/dL — AB (ref 65–99)
Potassium: 3.3 mmol/L — ABNORMAL LOW (ref 3.5–5.1)
SODIUM: 139 mmol/L (ref 135–145)
Total Bilirubin: 0.7 mg/dL (ref 0.3–1.2)
Total Protein: 7.8 g/dL (ref 6.5–8.1)

## 2015-05-25 LAB — FIBRIN DERIVATIVES D-DIMER (ARMC ONLY): Fibrin derivatives D-dimer (ARMC): 381.88 (ref 0–499)

## 2015-05-25 LAB — TROPONIN I: Troponin I: <0.03 ng/mL (ref <0.031)

## 2015-05-25 LAB — LIPASE, BLOOD: LIPASE: 24 U/L (ref 22–51)

## 2015-05-25 MED ORDER — GI COCKTAIL ~~LOC~~
30.0000 mL | Freq: Once | ORAL | Status: DC
  Filled 2015-05-25: qty 30

## 2015-05-25 MED ORDER — MORPHINE SULFATE (PF) 4 MG/ML IV SOLN
4.0000 mg | Freq: Once | INTRAVENOUS | Status: AC
  Administered 2015-05-25: 4 mg via INTRAVENOUS

## 2015-05-25 MED ORDER — ONDANSETRON HCL 4 MG/2ML IJ SOLN
4.0000 mg | Freq: Once | INTRAMUSCULAR | Status: AC
  Filled 2015-05-25: qty 2

## 2015-05-25 MED ORDER — FAMOTIDINE 20 MG PO TABS: 20.0000 mg | ORAL_TABLET | Freq: Two times a day (BID) | ORAL | Status: DC

## 2015-05-25 MED ORDER — ONDANSETRON HCL 4 MG/2ML IJ SOLN
4.0000 mg | Freq: Once | INTRAMUSCULAR | Status: AC
  Administered 2015-05-25: 4 mg via INTRAVENOUS

## 2015-05-25 MED ORDER — SODIUM CHLORIDE 0.9 % IV BOLUS (SEPSIS)
500.0000 mL | Freq: Once | INTRAVENOUS | Status: AC
  Administered 2015-05-25: 500 mL via INTRAVENOUS

## 2015-05-25 MED ORDER — MORPHINE SULFATE (PF) 4 MG/ML IV SOLN
4.0000 mg | Freq: Once | INTRAVENOUS | Status: AC
  Filled 2015-05-25: qty 1

## 2015-05-25 MED ORDER — GI COCKTAIL ~~LOC~~
30.0000 mL | Freq: Once | ORAL | Status: AC
  Administered 2015-05-25: 30 mL via ORAL
  Filled 2015-05-25: qty 30

## 2015-05-25 NOTE — ED Notes (Signed)
Patient allowed to leave as has now been 4 hrs since narcotic.  NAD. Alert and oriented.  All discharge done by night shift RN

## 2015-05-25 NOTE — ED Notes (Signed)
Patient alert and oriented.  Waiting until 8 am to leave r/t narcotics given.  No needs at this time

## 2015-05-25 NOTE — ED Notes (Signed)
Patient reports short of breath and epigastric pain.  Reports having the pain off/on for approximately one week.  Patient states feels like she has food stuck.

## 2015-05-25 NOTE — ED Provider Notes (Signed)
[facility] Emergency Department Provider Note  ____________________________________________  Time seen: Approximately 3:46 AM  I have reviewed the triage vital signs and the nursing notes.   HISTORY  Chief Complaint Shortness of Breath and Chest Pain    HPI April Tran is a 24 y.o. female resents to the ED from home with a chief complaint of epigastric pain and shortness of breath. Patient reports epigastric pain on/off for approximately 1 week, worse with food. States she feels like every time she eats the food gets stuck in throat throat. Denies fever, chills, chest pain, vomiting, diarrhea. Symptoms associated with nausea. Went to a family reunion last night and ate an assortment of food including potato chips, chicken thigh, grapes, bread, brownies. Awoke prior to arrival with sharp epigastric pain which makes her short of breath. Denies recent travel, trauma or surgery.   Past medical history None   There are no active problems to display for this patient.   Past surgical history None  Current Outpatient Rx  Name  Route  Sig  Dispense  Refill  . PRESCRIPTION MEDICATION   Oral   Take 1 tablet by mouth daily. Birth control (norno?)           Allergies Review of patient's allergies indicates no known allergies.  Family history Esophageal stenosis  Social History Social History  Substance Use Topics  . Smoking status: Never Smoker   . Smokeless tobacco: Not on file  . Alcohol Use: No    Review of Systems Constitutional: No fever/chills Eyes: No visual changes. ENT: No sore throat. Cardiovascular: Denies chest pain. Respiratory: Positive for shortness of breath. Gastrointestinal: Positive for abdominal pain.  Positive for nausea, no vomiting.  No diarrhea.  No constipation. Genitourinary: Negative for dysuria. Musculoskeletal: Negative for back pain. Skin: Negative for rash. Neurological: Negative for headaches, focal  weakness or numbness.  10-point ROS otherwise negative.  ____________________________________________   PHYSICAL EXAM:  VITAL SIGNS: ED Triage Vitals  Enc Vitals Group     BP 05/25/15 0248 146/84 mmHg     Pulse Rate 05/25/15 0248 87     Resp 05/25/15 0248 22     Temp 05/25/15 0248 97.7 F (36.5 C)     Temp Source 05/25/15 0248 Oral     SpO2 05/25/15 0248 100 %     Weight 05/25/15 0248 190 lb (86.183 kg)     Height 05/25/15 0248  (1.676 m)     Head Cir --      Peak Flow --      Pain Score 05/25/15 0300 8     Pain Loc --      Pain Edu? --      Excl. in GC? --     Constitutional: Alert and oriented. Well appearing and in moderate acute distress, tearful. Eyes: Conjunctivae are normal. PERRL. EOMI. Head: Atraumatic. Nose: No congestion/rhinnorhea. Mouth/Throat: Mucous membranes are moist.  Oropharynx non-erythematous. Neck: No stridor.   Cardiovascular: Normal rate, regular rhythm. Grossly normal heart sounds.  Good peripheral circulation. Respiratory: Normal respiratory effort.  No retractions. Lungs CTAB. Gastrointestinal: Soft and moderately tender to palpation epigastrium and right upper quadrant without rebound or guarding. No distention. No abdominal bruits. No CVA tenderness. Musculoskeletal: No lower extremity tenderness nor edema.  No joint effusions. Neurologic:  Normal speech and language. No gross focal neurologic deficits are appreciated. No gait instability. Skin:  Skin is warm, dry and intact. No rash noted. Psychiatric: Mood and affect are normal. Speech  and behavior are normal.  ____________________________________________   LABS (all labs ordered are listed, but only abnormal results are displayed)  Labs Reviewed  CBC WITH DIFFERENTIAL/PLATELET - Abnormal; Notable for the following:    Lymphs Abs 4.1 (*)    All other components within normal limits  COMPREHENSIVE METABOLIC PANEL - Abnormal; Notable for the following:    Potassium 3.3 (*)     Glucose, Bld 110 (*)    All other components within normal limits  URINALYSIS COMPLETEWITH MICROSCOPIC ([facility] ONLY) - Abnormal; Notable for the following:    Color, Urine YELLOW (*)    APPearance CLEAR (*)    Squamous Epithelial / LPF 6-30 (*)    All other components within normal limits  LIPASE, BLOOD  TROPONIN I  FIBRIN DERIVATIVES D-DIMER ([facility] ONLY)  POC URINE PREG, ED   ____________________________________________  EKG  ED ECG REPORT I, Mikey Maffett J, the attending physician, personally viewed and interpreted this ECG.   Date: 05/25/2015  EKG Time: 0258  Rate: 74  Rhythm: normal EKG, normal sinus rhythm  Axis: normal  Intervals:none  ST&T Change: nonspecific  ____________________________________________  RADIOLOGY  Chest 2 view (viewed by me, interpreted per Dr. Phill Myron): No active cardiopulmonary disease.  Ultrasound interpreted per Dr. Phill Myron Normal right upper quadrant ultrasound without sonographic evidence for cholelithiasis, acute cholecystitis, or biliary dilatation.  ____________________________________________   PROCEDURES  Procedure(s) performed: None  Critical Care performed: No  ____________________________________________   INITIAL IMPRESSION / ASSESSMENT AND PLAN / ED COURSE  Pertinent labs & imaging results that were available during my care of the patient were reviewed by me and considered in my medical decision making (see chart for details).  24 year old female who presents with a one-week history of epigastric pain which is worse this evening. Will obtain screening lab work including hepatic panel and lipase, administer IV analgesia and antiemetic, obtain ultrasound to evaluate for cholecystitis.  ----------------------------------------- 5:16 AM on 05/25/2015 -----------------------------------------  Patient's pain initially worsened upon administration of morphine, then improved. She is back from imaging studies and resting  comfortably in no acute distress. Pending results of ultrasound.  ----------------------------------------- 5:48 AM on 05/25/2015 -----------------------------------------  Updated patient of imaging results. Complains of burning sensation in throat and epigastrium. Patient is on OCPs; will add d-dimer given her complaints of shortness of breath. Low suspicion for pulmonary embolism given patient is not tachycardic, tachypneic, nor hypoxic.  ----------------------------------------- 6:23 AM on 05/25/2015 -----------------------------------------  Patient improved. Updated her of urinalysis and d-dimer results. Will start her on Pepcid twice a day and follow-up with GI. Strict return precautions given. Patient verbalizes understanding and agrees with plan of care.  ____________________________________________   FINAL CLINICAL IMPRESSION(S) / ED DIAGNOSES  Final diagnoses:  Epigastric pain      Irean Hong, MD 05/29/15 [phone]

## 2015-05-25 NOTE — ED Notes (Signed)
EKG done

## 2015-05-25 NOTE — Discharge Instructions (Signed)
1. Start Pepcid 20 mg twice daily (#60). 2. Avoid foods that are fatty, spicy, greasy and alcohol. 3. Return to the ER for worsening symptoms, persistent vomiting, difficulty breathing or other concerns.  Abdominal Pain Many things can cause abdominal pain. Usually, abdominal pain is not caused by a disease and will improve without treatment. It can often be observed and treated at home. Your health care provider will do a physical exam and possibly order blood tests and X-rays to help determine the seriousness of your pain. However, in many cases, more time must pass before a clear cause of the pain can be found. Before that point, your health care provider may not know if you need more testing or further treatment. HOME CARE INSTRUCTIONS  Monitor your abdominal pain for any changes. The following actions may help to alleviate any discomfort you are experiencing:  Only take over-the-counter or prescription medicines as directed by your health care provider.  Do not take laxatives unless directed to do so by your health care provider.  Try a clear liquid diet (broth, tea, or water) as directed by your health care provider. Slowly move to a bland diet as tolerated. SEEK MEDICAL CARE IF:  You have unexplained abdominal pain.  You have abdominal pain associated with nausea or diarrhea.  You have pain when you urinate or have a bowel movement.  You experience abdominal pain that wakes you in the night.  You have abdominal pain that is worsened or improved by eating food.  You have abdominal pain that is worsened with eating fatty foods.  You have a fever. SEEK IMMEDIATE MEDICAL CARE IF:   Your pain does not go away within 2 hours.  You keep throwing up (vomiting).  Your pain is felt only in portions of the abdomen, such as the right side or the left lower portion of the abdomen.  You pass bloody or black tarry stools. MAKE SURE YOU:  Understand these instructions.   Will watch  your condition.   Will get help right away if you are not doing well or get worse.  Document Released: 06/09/2005 Document Revised: 09/04/2013 Document Reviewed: 05/09/2013 [facility] Patient Information 2015 [place], [place]. This information is not intended to replace advice given to you by your health care provider. Make sure you discuss any questions you have with your health care provider.

## 2015-05-25 NOTE — ED Notes (Signed)
Pt on med hold. 

## 2015-05-27 LAB — POCT PREGNANCY, URINE: PREG TEST UR: NEGATIVE

## 2015-08-03 ENCOUNTER — Encounter: Payer: Self-pay | Admitting: Emergency Medicine

## 2015-08-03 ENCOUNTER — Emergency Department
Admission: EM | Admit: 2015-08-03 | Discharge: 2015-08-03 | Disposition: A | Discharge status: Home / Self Care | Payer: Medicaid Other | Attending: Emergency Medicine | Admitting: Emergency Medicine

## 2015-08-03 ENCOUNTER — Emergency Department: Payer: Medicaid Other

## 2015-08-03 DIAGNOSIS — R1010 Upper abdominal pain, unspecified: Secondary | ICD-10-CM | POA: Diagnosis present

## 2015-08-03 DIAGNOSIS — Z3202 Encounter for pregnancy test, result negative: Secondary | ICD-10-CM | POA: Insufficient documentation

## 2015-08-03 DIAGNOSIS — Z79899 Other long term (current) drug therapy: Secondary | ICD-10-CM | POA: Insufficient documentation

## 2015-08-03 DIAGNOSIS — K2970 Gastritis, unspecified, without bleeding: Secondary | ICD-10-CM | POA: Diagnosis not present

## 2015-08-03 DIAGNOSIS — N938 Other specified abnormal uterine and vaginal bleeding: Secondary | ICD-10-CM | POA: Diagnosis not present

## 2015-08-03 DIAGNOSIS — R112 Nausea with vomiting, unspecified: Secondary | ICD-10-CM

## 2015-08-03 LAB — URINALYSIS COMPLETE WITH MICROSCOPIC (ARMC ONLY)
BILIRUBIN URINE: NEGATIVE
Bacteria, UA: NONE SEEN
Glucose, UA: NEGATIVE mg/dL
Hgb urine dipstick: NEGATIVE
KETONES UR: NEGATIVE mg/dL
Leukocytes, UA: NEGATIVE
Nitrite: NEGATIVE
PROTEIN: NEGATIVE mg/dL
RBC / HPF: NONE SEEN RBC/hpf (ref 0–5)
Specific Gravity, Urine: 1.017 (ref 1.005–1.030)
pH: 6 (ref 5.0–8.0)

## 2015-08-03 LAB — CHLAMYDIA/NGC RT PCR (ARMC ONLY)
Chlamydia Tr: NOT DETECTED
N gonorrhoeae: NOT DETECTED

## 2015-08-03 LAB — COMPREHENSIVE METABOLIC PANEL
ALT: 19 U/L (ref 14–54)
AST: 25 U/L (ref 15–41)
Albumin: 4.3 g/dL (ref 3.5–5.0)
Alkaline Phosphatase: 71 U/L (ref 38–126)
Anion gap: 8 (ref 5–15)
BILIRUBIN TOTAL: 0.7 mg/dL (ref 0.3–1.2)
BUN: 10 mg/dL (ref 6–20)
CHLORIDE: 103 mmol/L (ref 101–111)
CO2: 30 mmol/L (ref 22–32)
CREATININE: 0.89 mg/dL (ref 0.44–1.00)
Calcium: 9.7 mg/dL (ref 8.9–10.3)
Glucose, Bld: 90 mg/dL (ref 65–99)
POTASSIUM: 3.6 mmol/L (ref 3.5–5.1)
Sodium: 141 mmol/L (ref 135–145)
TOTAL PROTEIN: 8.1 g/dL (ref 6.5–8.1)

## 2015-08-03 LAB — CBC WITH DIFFERENTIAL/PLATELET
Basophils Absolute: 0 10*3/uL (ref 0–0.1)
Basophils Relative: 0 %
EOS PCT: 3 %
Eosinophils Absolute: 0.2 10*3/uL (ref 0–0.7)
HCT: 46.3 % (ref 35.0–47.0)
Hemoglobin: 15.3 g/dL (ref 12.0–16.0)
LYMPHS ABS: 2.5 10*3/uL (ref 1.0–3.6)
LYMPHS PCT: 39 %
MCH: 28.8 pg (ref 26.0–34.0)
MCHC: 33 g/dL (ref 32.0–36.0)
MCV: 87.4 fL (ref 80.0–100.0)
MONO ABS: 0.5 10*3/uL (ref 0.2–0.9)
MONOS PCT: 8 %
Neutro Abs: 3.2 10*3/uL (ref 1.4–6.5)
Neutrophils Relative %: 50 %
PLATELETS: 182 10*3/uL (ref 150–440)
RBC: 5.3 MIL/uL — ABNORMAL HIGH (ref 3.80–5.20)
RDW: 12.6 % (ref 11.5–14.5)
WBC: 6.4 10*3/uL (ref 3.6–11.0)

## 2015-08-03 LAB — WET PREP, GENITAL
CLUE CELLS WET PREP: NONE SEEN
SPERM: NONE SEEN
TRICH WET PREP: NONE SEEN
YEAST WET PREP: NONE SEEN

## 2015-08-03 LAB — POCT PREGNANCY, URINE: Preg Test, Ur: NEGATIVE

## 2015-08-03 LAB — TROPONIN I

## 2015-08-03 LAB — FIBRIN DERIVATIVES D-DIMER (ARMC ONLY): Fibrin derivatives D-dimer (ARMC): 213 (ref 0–499)

## 2015-08-03 LAB — LIPASE, BLOOD: LIPASE: 36 U/L (ref 11–51)

## 2015-08-03 MED ORDER — GI COCKTAIL ~~LOC~~
30.0000 mL | Freq: Once | ORAL | Status: AC
  Administered 2015-08-03: 30 mL via ORAL
  Filled 2015-08-03: qty 30

## 2015-08-03 MED ORDER — ONDANSETRON HCL 4 MG/2ML IJ SOLN
4.0000 mg | Freq: Once | INTRAMUSCULAR | Status: AC
  Administered 2015-08-03: 4 mg via INTRAVENOUS
  Filled 2015-08-03: qty 2

## 2015-08-03 MED ORDER — RANITIDINE HCL 75 MG PO TABS: 75.0000 mg | ORAL_TABLET | Freq: Two times a day (BID) | ORAL | Status: DC

## 2015-08-03 MED ORDER — SODIUM CHLORIDE 0.9 % IV BOLUS (SEPSIS)
1000.0000 mL | Freq: Once | INTRAVENOUS | Status: AC
  Administered 2015-08-03: 1000 mL via INTRAVENOUS

## 2015-08-03 NOTE — ED Notes (Signed)
Pt complains of abdominal pain and nausea for 2 weeks. Pt states it is very possible that she is pregnant.

## 2015-08-03 NOTE — ED Provider Notes (Signed)
[facility] Emergency Department Provider Note  ____________________________________________  Time seen: Approximately 445 PM  I have reviewed the triage vital signs and the nursing notes.   HISTORY  Chief Complaint Abdominal Pain    HPI April Tran is a 24 y.o. female without any chronic medical issues who has been having nausea and cramping abdominal pain over the past 2 weeks. She thinks she may be pregnant because she is having a regular period which is abnormal for her. She says that she is now having a dark brown to clear discharge as well. She has no history of sexually transmitted diseases and is not concerned for STDs at this time. She says that she is also having aversions to different smells.Also urinating more often than normal. She says that the pain is to her upper abdomen and cramping. It is nonradiating. Not worsened with food. Says that she is able to keep down sweets but nothing else. Denies any chest pain or pain with deep breathing but does say she is having shortness of breath.   History reviewed. No pertinent past medical history.  There are no active problems to display for this patient.   History reviewed. No pertinent past surgical history.  Current Outpatient Rx  Name  Route  Sig  Dispense  Refill  . famotidine (PEPCID) 20 MG tablet   Oral   Take 1 tablet (20 mg total) by mouth 2 (two) times daily.   60 tablet   0   . PRESCRIPTION MEDICATION   Oral   Take 1 tablet by mouth daily. Birth control (norno?)           Allergies Review of patient's allergies indicates no known allergies.  History reviewed. No pertinent family history.  Social History Social History  Substance Use Topics  . Smoking status: Never Smoker   . Smokeless tobacco: None  . Alcohol Use: No    Review of Systems Constitutional: No fever/chills Eyes: No visual changes. ENT: No sore throat. Cardiovascular: Denies chest pain. Respiratory:  As above Gastrointestinal:  No diarrhea.  No constipation. Genitourinary: Frequency Musculoskeletal: Negative for back pain. Skin: Negative for rash. Neurological: Negative for headaches, focal weakness or numbness.  10-point ROS otherwise negative.  ____________________________________________   PHYSICAL EXAM:  VITAL SIGNS: ED Triage Vitals  Enc Vitals Group     BP 08/03/15 1612 135/82 mmHg     Pulse Rate 08/03/15 1612 116     Resp --      Temp 08/03/15 1612 97.7 F (36.5 C)     Temp Source 08/03/15 1612 Oral     SpO2 08/03/15 1612 94 %     Weight 08/03/15 1612 203 lb (92.08 kg)     Height 08/03/15 1612  (1.676 m)     Head Cir --      Peak Flow --      Pain Score 08/03/15 1619 5     Pain Loc --      Pain Edu? --      Excl. in GC? --     Constitutional: Alert and oriented. Well appearing and in no acute distress. Eyes: Conjunctivae are normal. PERRL. EOMI. Head: Atraumatic. Nose: No congestion/rhinnorhea. Mouth/Throat: Mucous membranes are moist.  Oropharynx non-erythematous. Neck: No stridor.   Cardiovascular: Normal rate, regular rhythm. Grossly normal heart sounds.  Good peripheral circulation. Heart rate was 78 while I was in the room with the patient. Respiratory: Normal respiratory effort.  No retractions. Lungs CTAB. Gastrointestinal: Soft  with epigastric as well as right upper quadrant and suprapubic tenderness palpation. There was no rebound or guarding. There was no tenderness to palpation over McBurney's point. The patient a negative Murphy sign.. No distention. No abdominal bruits. No CVA tenderness. Genitourinary:  Normal external exam. Speculum exam with mucus-like white discharge. No bleeding on speculum exam. Bimanual exam without CMT. No uterine or adnexal tenderness palpation or masses. Musculoskeletal: No lower extremity tenderness nor edema.  No joint effusions. Neurologic:  Normal speech and language. No gross focal neurologic deficits are  appreciated. No gait instability. Skin:  Skin is warm, dry and intact. No rash noted. Psychiatric: Mood and affect are normal. Speech and behavior are normal.  ____________________________________________   LABS (all labs ordered are listed, but only abnormal results are displayed)  Labs Reviewed  WET PREP, GENITAL - Abnormal; Notable for the following:    WBC, Wet Prep HPF POC RARE (*)    All other components within normal limits  URINALYSIS COMPLETEWITH MICROSCOPIC ([facility] ONLY) - Abnormal; Notable for the following:    Color, Urine YELLOW (*)    APPearance CLEAR (*)    Squamous Epithelial / LPF 0-5 (*)    All other components within normal limits  CBC WITH DIFFERENTIAL/PLATELET - Abnormal; Notable for the following:    RBC 5.30 (*)    All other components within normal limits  CHLAMYDIA/NGC RT PCR ([facility] ONLY)  COMPREHENSIVE METABOLIC PANEL  LIPASE, BLOOD  TROPONIN I  FIBRIN DERIVATIVES D-DIMER ([facility] ONLY)  POC URINE PREG, ED  POCT PREGNANCY, URINE   ____________________________________________  EKG  ED ECG REPORT I, Mellany Dinsmore,  Teena Iraniavid M, the attending physician, personally viewed and interpreted this ECG.   Date: 08/03/2015  EKG Time: 1659  Rate: 76  Rhythm: normal EKG, normal sinus rhythm  Axis: Normal axis  Intervals:none  ST&T Change: No ST segment elevation or depression. No abnormal T-wave inversion.  ____________________________________________  RADIOLOGY  Normal right upper quadrant ultrasound. No active cardiopulmonary disease on the chest x-ray. ____________________________________________   PROCEDURES   ____________________________________________   INITIAL IMPRESSION / ASSESSMENT AND PLAN / ED COURSE  Pertinent labs & imaging results that were available during my care of the patient were reviewed by me and considered in my medical decision making (see chart for details).  ----------------------------------------- 8:31 PM on  08/03/2015 -----------------------------------------  Patient with very reassuring workup. Did have some relief with GI cocktail but still was tender to the upper abdomen. This is why I continued with further workup including d-dimer, ultrasound as well as chest x-ray. All were reassuring. The patient is resting comfortably and has not any vomiting here in the emergency department. She was able to tolerate the GI cocktail. I will discharge the patient home with an antacid. I'm going to give her a diagnosis of gastritis. However, it is unclear why she is having the dysfunctional uterine bleeding. [facility] where she has had follow-up in the past for OB/GYN issues. Did not pursue ultrasonography of the pelvis secondary to a very reassuring pelvic exam without any tenderness or masses. Patient understands the plan and is willing to comply. ____________________________________________   FINAL CLINICAL IMPRESSION(S) / ED DIAGNOSES  Gastritis. Abdominal pain. Nausea and vomiting. Dysfunctional uterine bleeding.    Myrna Blazeravid Matthew Priyansh Pry, MD 08/03/15 2032

## 2015-10-28 ENCOUNTER — Emergency Department
Admission: EM | Admit: 2015-10-28 | Discharge: 2015-10-28 | Disposition: A | Discharge status: Home / Self Care | Payer: Medicaid Other | Attending: Emergency Medicine | Admitting: Emergency Medicine

## 2015-10-28 ENCOUNTER — Encounter: Payer: Self-pay | Admitting: Emergency Medicine

## 2015-10-28 DIAGNOSIS — Z79899 Other long term (current) drug therapy: Secondary | ICD-10-CM | POA: Insufficient documentation

## 2015-10-28 DIAGNOSIS — J101 Influenza due to other identified influenza virus with other respiratory manifestations: Secondary | ICD-10-CM | POA: Diagnosis not present

## 2015-10-28 DIAGNOSIS — Z3202 Encounter for pregnancy test, result negative: Secondary | ICD-10-CM | POA: Insufficient documentation

## 2015-10-28 DIAGNOSIS — R509 Fever, unspecified: Secondary | ICD-10-CM | POA: Diagnosis present

## 2015-10-28 LAB — CBC
HCT: 39.6 % (ref 35.0–47.0)
Hemoglobin: 13.5 g/dL (ref 12.0–16.0)
MCH: 28.9 pg (ref 26.0–34.0)
MCHC: 34.1 g/dL (ref 32.0–36.0)
MCV: 84.8 fL (ref 80.0–100.0)
PLATELETS: 127 10*3/uL — AB (ref 150–440)
RBC: 4.67 MIL/uL (ref 3.80–5.20)
RDW: 13 % (ref 11.5–14.5)
WBC: 6.7 10*3/uL (ref 3.6–11.0)

## 2015-10-28 LAB — URINALYSIS COMPLETE WITH MICROSCOPIC (ARMC ONLY)
BILIRUBIN URINE: NEGATIVE
Bacteria, UA: NONE SEEN
GLUCOSE, UA: NEGATIVE mg/dL
Leukocytes, UA: NEGATIVE
NITRITE: NEGATIVE
PROTEIN: 30 mg/dL — AB
Specific Gravity, Urine: 1.033 — ABNORMAL HIGH (ref 1.005–1.030)
pH: 5 (ref 5.0–8.0)

## 2015-10-28 LAB — COMPREHENSIVE METABOLIC PANEL
ALBUMIN: 3.8 g/dL (ref 3.5–5.0)
ALK PHOS: 56 U/L (ref 38–126)
ALT: 19 U/L (ref 14–54)
AST: 22 U/L (ref 15–41)
Anion gap: 9 (ref 5–15)
BILIRUBIN TOTAL: 0.5 mg/dL (ref 0.3–1.2)
BUN: 10 mg/dL (ref 6–20)
CALCIUM: 8.7 mg/dL — AB (ref 8.9–10.3)
CO2: 27 mmol/L (ref 22–32)
CREATININE: 0.96 mg/dL (ref 0.44–1.00)
Chloride: 104 mmol/L (ref 101–111)
GFR calc Af Amer: >60 mL/min (ref >60)
GFR calc non Af Amer: >60 mL/min (ref >60)
GLUCOSE: 93 mg/dL (ref 65–99)
Potassium: 3.2 mmol/L — ABNORMAL LOW (ref 3.5–5.1)
SODIUM: 140 mmol/L (ref 135–145)
TOTAL PROTEIN: 7.9 g/dL (ref 6.5–8.1)

## 2015-10-28 LAB — LIPASE, BLOOD: Lipase: 21 U/L (ref 11–51)

## 2015-10-28 LAB — RAPID INFLUENZA A&B ANTIGENS
Influenza A (ARMC): POSITIVE
Influenza B (ARMC): NEGATIVE

## 2015-10-28 LAB — POCT PREGNANCY, URINE: PREG TEST UR: NEGATIVE

## 2015-10-28 MED ORDER — ONDANSETRON 4 MG PO TBDP
4.0000 mg | ORAL_TABLET | Freq: Three times a day (TID) | ORAL | Status: DC | PRN
Start: 2015-10-28 — End: 2016-04-17

## 2015-10-28 MED ORDER — PROMETHAZINE HCL 12.5 MG PO TABS: 12.5000 mg | ORAL_TABLET | Freq: Four times a day (QID) | ORAL | Status: DC | PRN

## 2015-10-28 MED ORDER — PROMETHAZINE HCL 25 MG PO TABS: 25.0000 mg | ORAL_TABLET | Freq: Four times a day (QID) | ORAL | Status: DC | PRN

## 2015-10-28 MED ORDER — ONDANSETRON HCL 4 MG/2ML IJ SOLN
4.0000 mg | Freq: Once | INTRAMUSCULAR | Status: AC
  Administered 2015-10-28: 4 mg via INTRAVENOUS

## 2015-10-28 MED ORDER — BUTALBITAL-APAP-CAFFEINE 50-325-40 MG PO TABS: 1.0000 | ORAL_TABLET | Freq: Four times a day (QID) | ORAL | Status: DC | PRN

## 2015-10-28 MED ORDER — SODIUM CHLORIDE 0.9 % IV BOLUS (SEPSIS)
1000.0000 mL | Freq: Once | INTRAVENOUS | Status: AC
  Administered 2015-10-28: 1000 mL via INTRAVENOUS

## 2015-10-28 MED ORDER — BUTALBITAL-APAP-CAFFEINE 50-325-40 MG PO TABS
2.0000 | ORAL_TABLET | Freq: Once | ORAL | Status: AC
  Administered 2015-10-28: 2 via ORAL

## 2015-10-28 MED ORDER — BUTALBITAL-APAP-CAFFEINE 50-325-40 MG PO TABS
ORAL_TABLET | ORAL | Status: AC
  Administered 2015-10-28: 2 via ORAL
  Filled 2015-10-28: qty 2

## 2015-10-28 NOTE — ED Notes (Addendum)
Pt c/o generalized pain, decreased appetite with intermit fever since Friday. Pt also states she has been n/v. Pt states she has taken tylenol cold and flu tamaflu. Pt A&O

## 2015-10-28 NOTE — ED Provider Notes (Signed)
[facility] Emergency Department Provider Note  Time seen: 3:15 PM  I have reviewed the triage vital signs and the nursing notes.   HISTORY  Chief Complaint Fever; Cough; and Emesis    HPI April Tran is a 25 y.o. female with no past medical history who presents the emergency department with 4-5 days of body aches, generalized weakness, fever, cough, nausea and vomiting. Patient has been taking over-the-counter cold medications without relief. States her nausea and vomiting have progressed and she is becoming nauseated whenever she drinks or eats so she came to the emergency department for evaluation. States subjective fevers at home but has not measured temperature. Describes her body aches as moderate. Nausea is moderate.     History reviewed. No pertinent past medical history.  There are no active problems to display for this patient.   Past Surgical History  Procedure Laterality Date  . Tonsillectomy      Current Outpatient Rx  Name  Route  Sig  Dispense  Refill  . famotidine (PEPCID) 20 MG tablet   Oral   Take 1 tablet (20 mg total) by mouth 2 (two) times daily.   60 tablet   0   . PRESCRIPTION MEDICATION   Oral   Take 1 tablet by mouth daily. Birth control (norno?)         . ranitidine (ZANTAC) 75 MG tablet   Oral   Take 1 tablet (75 mg total) by mouth 2 (two) times daily.   30 tablet   0     Allergies Review of patient's allergies indicates no known allergies.  No family history on file.  Social History Social History  Substance Use Topics  . Smoking status: Never Smoker   . Smokeless tobacco: None  . Alcohol Use: No    Review of Systems Constitutional: As it for fever Cardiovascular: Negative for chest pain. Respiratory: Negative for shortness of breath. Positive for cough Gastrointestinal: Negative for abdominal pain Genitourinary: Negative for dysuria. Neurological: No headache. Denies focal weakness or  numbness. 10-point ROS otherwise negative.  ____________________________________________   PHYSICAL EXAM:  VITAL SIGNS: ED Triage Vitals  Enc Vitals Group     BP 10/28/15 1252 141/84 mmHg     Pulse Rate 10/28/15 1252 84     Resp 10/28/15 1252 20     Temp 10/28/15 1252 98.2 F (36.8 C)     Temp Source 10/28/15 1252 Oral     SpO2 10/28/15 1252 100 %     Weight 10/28/15 1252 210 lb (95.255 kg)     Height 10/28/15 1252  (1.676 m)     Head Cir --      Peak Flow --      Pain Score 10/28/15 1253 8     Pain Loc --      Pain Edu? --      Excl. in GC? --     Constitutional: Alert and oriented. No distress. Eyes: Normal exam ENT   Head: Normocephalic and atraumatic.   Mouth/Throat: Mucous membranes are moist. Cardiovascular: Normal rate, regular rhythm. No murmur Respiratory: Normal respiratory effort without tachypnea nor retractions. Breath sounds are clear. Gastrointestinal: Soft and nontender. No distention.   Musculoskeletal: Nontender with normal range of motion in all extremities Neurologic:  Normal speech and language. No gross focal neurologic deficits Skin:  Skin is warm, dry and intact.  Psychiatric: Mood and affect are normal. [facility]___________    INITIAL IMPRESSION /  ASSESSMENT AND PLAN / ED COURSE  Pertinent labs & imaging results that were available during my care of the patient were reviewed by me and considered in my medical decision making (see chart for details).  Patient presents the emergency department with 4-5 days of nausea, body aches, subjective fevers, cough and headache. Patient's labs are largely within normal limits besides positive for influenza A. Given the patient's prolonged illness, we will IV hydrate, treat with Zofran and Fioricet. I do not believe Tamiflu it be of benefit as patient is on day 4 or 5 of the illness. We will discharge on Zofran ODT and Fioricet as needed.  Patient agreeable to plan.  ____________________________________________   FINAL CLINICAL IMPRESSION(S) / ED DIAGNOSES  Influenza   Minna Antis, MD 10/28/15 2229

## 2015-10-28 NOTE — ED Notes (Signed)
Patient has mask on.

## 2015-10-28 NOTE — ED Notes (Signed)
Body aches, cough, vomiting, can't eat.  Onset of symptoms Friday night.  Also c/o intermittent fevers.  Has been taking tylenol for fevers, last taken 0300.

## 2015-10-28 NOTE — Discharge Instructions (Signed)
Please take your medications as prescribed. Please follow-up with a primary care physician in 2-3 days for recheck/reevaluation unless you have improved. Return to the emergency department for any worsening symptoms. Please drink plenty fluids and obtain plenty of rest.   Influenza, Adult Influenza ("the flu") is a viral infection of the respiratory tract. It occurs more often in winter months because people spend more time in close contact with one another. Influenza can make you feel very sick. Influenza easily spreads from person to person (contagious). CAUSES  Influenza is caused by a virus that infects the respiratory tract. You can catch the virus by breathing in droplets from an infected person's cough or sneeze. You can also catch the virus by touching something that was recently contaminated with the virus and then touching your mouth, nose, or eyes. RISKS AND COMPLICATIONS You may be at risk for a more severe case of influenza if you smoke cigarettes, have diabetes, have chronic heart disease (such as heart failure) or lung disease (such as asthma), or if you have a weakened immune system. Elderly people and pregnant women are also at risk for more serious infections. The most common problem of influenza is a lung infection (pneumonia). Sometimes, this problem can require emergency medical care and may be life threatening. SIGNS AND SYMPTOMS  Symptoms typically last 4 to 10 days and may include:  Fever.  Chills.  Headache, body aches, and muscle aches.  Sore throat.  Chest discomfort and cough.  Poor appetite.  Weakness or feeling tired.  Dizziness.  Nausea or vomiting. DIAGNOSIS  Diagnosis of influenza is often made based on your history and a physical exam. A nose or throat swab test can be done to confirm the diagnosis. TREATMENT  In mild cases, influenza goes away on its own. Treatment is directed at relieving symptoms. For more severe cases, your health care provider  may prescribe antiviral medicines to shorten the sickness. Antibiotic medicines are not effective because the infection is caused by a virus, not by bacteria. HOME CARE INSTRUCTIONS  Take medicines only as directed by your health care provider.  Use a cool mist humidifier to make breathing easier.  Get plenty of rest until your temperature returns to normal. This usually takes 3 to 4 days.  Drink enough fluid to keep your urine clear or pale yellow.  Cover yourmouth and nosewhen coughing or sneezing,and wash your handswellto prevent thevirusfrom spreading.  Stay homefromwork orschool untilthe fever is gonefor at least 53full day. PREVENTION  An annual influenza vaccination (flu shot) is the best way to avoid getting influenza. An annual flu shot is now routinely recommended for all adults in the [place] SEEK MEDICAL CARE IF:  You experiencechest pain, yourcough worsens,or you producemore mucus.  Youhave nausea,vomiting, ordiarrhea.  Your fever returns or gets worse. SEEK IMMEDIATE MEDICAL CARE IF:  You havetrouble breathing, you become short of breath,or your skin ornails becomebluish.  You have severe painor stiffnessin the neck.  You develop a sudden headache, or pain in the face or ear.  You have nausea or vomiting that you cannot control. MAKE SURE YOU:   Understand these instructions.  Will watch your condition.  Will get help right away if you are not doing well or get worse.   This information is not intended to replace advice given to you by your health care provider. Make sure you discuss any questions you have with your health care provider.   Document Released: 08/27/2000 Document Revised: 09/20/2014 Document Reviewed: 11/29/2011  Elsevier Interactive Patient Education ©2016 [employer] ° °

## 2015-12-06 ENCOUNTER — Emergency Department: Payer: Medicaid Other

## 2015-12-06 ENCOUNTER — Emergency Department
Admission: EM | Admit: 2015-12-06 | Discharge: 2015-12-06 | Disposition: A | Discharge status: Home / Self Care | Payer: Medicaid Other | Attending: Emergency Medicine | Admitting: Emergency Medicine

## 2015-12-06 ENCOUNTER — Encounter: Payer: Self-pay | Admitting: Emergency Medicine

## 2015-12-06 DIAGNOSIS — S299XXA Unspecified injury of thorax, initial encounter: Secondary | ICD-10-CM | POA: Diagnosis present

## 2015-12-06 DIAGNOSIS — S20212A Contusion of left front wall of thorax, initial encounter: Secondary | ICD-10-CM | POA: Diagnosis not present

## 2015-12-06 DIAGNOSIS — W1809XA Striking against other object with subsequent fall, initial encounter: Secondary | ICD-10-CM | POA: Insufficient documentation

## 2015-12-06 DIAGNOSIS — Y92812 Truck as the place of occurrence of the external cause: Secondary | ICD-10-CM | POA: Diagnosis not present

## 2015-12-06 DIAGNOSIS — Y939 Activity, unspecified: Secondary | ICD-10-CM | POA: Insufficient documentation

## 2015-12-06 DIAGNOSIS — Y999 Unspecified external cause status: Secondary | ICD-10-CM | POA: Diagnosis not present

## 2015-12-06 MED ORDER — KETOROLAC TROMETHAMINE 30 MG/ML IJ SOLN
30.0000 mg | Freq: Once | INTRAMUSCULAR | Status: AC
  Administered 2015-12-06: 30 mg via INTRAMUSCULAR
  Filled 2015-12-06: qty 1

## 2015-12-06 NOTE — ED Provider Notes (Signed)
[facility] Emergency Department Provider Note  ____________________________________________  Time seen: Approximately 10:00 AM  I have reviewed the triage vital signs and the nursing notes.   HISTORY  Chief Complaint Chest Injury    HPI April Tran is a 25 y.o. female , NAD, presents to the emergency department with left lower rib pain after falling in the back of a truck yesterday. Patient initiated difficulty sleeping last night due to the pain. Has not taken anything over-the-counter such as Tylenol or ibuprofen for her pain at this time. Has been applying ice. Denies any shortness of breath, chest pain, back pain. Has not had any numbness, weakness, tingling. Denies any visual changes or other medical conditions that caused the fall. Notes she has had a cough and bronchitis over the last month which is improving.   History reviewed. No pertinent past medical history.  There are no active problems to display for this patient.   Past Surgical History  Procedure Laterality Date  . Tonsillectomy      Current Outpatient Rx  Name  Route  Sig  Dispense  Refill  . butalbital-acetaminophen-caffeine (FIORICET) 50-325-40 MG tablet   Oral   Take 1-2 tablets by mouth every 6 (six) hours as needed for headache.   20 tablet   0   . famotidine (PEPCID) 20 MG tablet   Oral   Take 1 tablet (20 mg total) by mouth 2 (two) times daily.   60 tablet   0   . ondansetron (ZOFRAN ODT) 4 MG disintegrating tablet   Oral   Take 1 tablet (4 mg total) by mouth every 8 (eight) hours as needed for nausea or vomiting.   20 tablet   0   . PRESCRIPTION MEDICATION   Oral   Take 1 tablet by mouth daily. Birth control (norno?)         . promethazine (PHENERGAN) 12.5 MG tablet   Oral   Take 1 tablet (12.5 mg total) by mouth every 6 (six) hours as needed for nausea or vomiting.   20 tablet   0   . promethazine (PHENERGAN) 25 MG tablet   Oral   Take 1 tablet  (25 mg total) by mouth every 6 (six) hours as needed for nausea or vomiting.   20 tablet   0   . ranitidine (ZANTAC) 75 MG tablet   Oral   Take 1 tablet (75 mg total) by mouth 2 (two) times daily.   30 tablet   0     Allergies Review of patient's allergies indicates no known allergies.  No family history on file.  Social History Social History  Substance Use Topics  . Smoking status: Never Smoker   . Smokeless tobacco: None  . Alcohol Use: No     Review of Systems  Constitutional: No fever/chills, fatigue Eyes: No visual changes.  Cardiovascular: No chest pain, palpitations. Respiratory: Positive cough. No shortness of breath. No wheezing.  Gastrointestinal: No abdominal pain.  No nausea, vomiting.  Musculoskeletal: Positive left sided rib pain. Negative for back pain, neck pain.  Skin: Negative for rash, bruising, lacerations, swelling. Neurological: Negative for headaches, focal weakness or numbness. No dizziness, LOC.  10-point ROS otherwise negative.  ____________________________________________   PHYSICAL EXAM:  VITAL SIGNS: ED Triage Vitals  Enc Vitals Group     BP 12/06/15 0929 137/87 mmHg     Pulse Rate 12/06/15 0929 77     Resp 12/06/15 0929 20     Temp 12/06/15  0929 97.7 F (36.5 C)     Temp Source 12/06/15 0929 Oral     SpO2 12/06/15 0929 100 %     Weight 12/06/15 0929 207 lb (93.895 kg)     Height 12/06/15 0929 5\' 6"  (1.676 m)     Head Cir --      Peak Flow --      Pain Score 12/06/15 0932 8     Pain Loc --      Pain Edu? --      Excl. in GC? --     Constitutional: Alert and oriented. Well appearing and in no acute distress. Eyes: Conjunctivae are normal. PERRL. EOMI without pain.  Head: Atraumatic. Neck: Supple with full range of motion. Hematological/Lymphatic/Immunilogical: No cervical lymphadenopathy. Cardiovascular: Normal rate, regular rhythm. Normal S1 and S2.  Good peripheral circulation. Respiratory: Normal respiratory effort  without tachypnea or retractions. Lungs CTAB with breath sounds noted throughout. Gastrointestinal: Soft and nontender. No distention, guarding Musculoskeletal: Tenderness to palpation about the left lateral rib line. No deformities or crepitus to palpation.  Neurologic:  Normal speech and language. No gross focal neurologic deficits are appreciated.  Skin:  Skin is warm, dry and intact. No rash, redness, ecchymosis, lacerations noted. Psychiatric: Mood and affect are normal. Speech and behavior are normal. Patient exhibits appropriate insight and judgement.   ____________________________________________   LABS  None  ____________________________________________  EKG  None ____________________________________________  RADIOLOGY I have personally viewed and evaluated these images (plain radiographs) as part of my medical decision making, as well as reviewing the written report by the radiologist.  Dg Ribs Unilateral W/chest Left  12/06/2015  CLINICAL DATA:  25 year old female with a history of bronchitis EXAM: LEFT RIBS AND CHEST - 3+ VIEW COMPARISON:  08/03/2015 FINDINGS: No fracture or other bone lesions are seen involving the ribs. There is no evidence of pneumothorax or pleural effusion. Both lungs are clear. Heart size and mediastinal contours are within normal limits. IMPRESSION: Negative. Signed, Yvone NeuJaime S. Loreta AveWagner, DO [facility] Electronically Signed   By: Gilmer MorJaime  Wagner D.O.   On: 12/06/2015 10:11    ____________________________________________    PROCEDURES  Procedure(s) performed: None    Medications  ketorolac (TORADOL) 30 MG/ML injection 30 mg (30 mg Intramuscular Given 12/06/15 1023)     ____________________________________________   INITIAL IMPRESSION / ASSESSMENT AND PLAN / ED COURSE  Pertinent imaging results that were available during my care of the patient were reviewed by me and considered in my  medical decision making (see chart for details).  Patient's diagnosis is consistent with left-sided rib contusion. Patient will be discharged home with instructions for the patient to alternate Tylenol and ibuprofen as needed for pain. May continue to apply ice 20 minutes 3-4 times daily as needed. Patient is to follow up with her primary care provider or [place] [facility] if symptoms persist past this treatment course. Patient is given ED precautions to return to the ED for any worsening or new symptoms.    ____________________________________________  FINAL CLINICAL IMPRESSION(S) / ED DIAGNOSES  Final diagnoses:  Rib contusion, left, initial encounter      NEW MEDICATIONS STARTED DURING THIS VISIT:  New Prescriptions   No medications on file         Hope PigeonJami L Alessandro Griep, PA-C 12/06/15 1039  Jeanmarie PlantJames A McShane, MD 12/06/15 1228

## 2015-12-06 NOTE — ED Notes (Signed)
Fell yesterday and hit L chest on truck. Pain since. Mechanical fall.

## 2015-12-06 NOTE — Discharge Instructions (Signed)
Rib Contusion A rib contusion is a deep bruise on your rib area. Contusions are the result of a blunt trauma that causes bleeding and injury to the tissues under the skin. A rib contusion may involve bruising of the ribs and of the skin and muscles in the area. The skin overlying the contusion may turn blue, purple, or yellow. Minor injuries will give you a painless contusion, but more severe contusions may stay painful and swollen for a few weeks. CAUSES  A contusion is usually caused by a blow, trauma, or direct force to an area of the body. This often occurs while playing contact sports. SYMPTOMS  Swelling and redness of the injured area.  Discoloration of the injured area.  Tenderness and soreness of the injured area.  Pain with or without movement. DIAGNOSIS  The diagnosis can be made by taking a medical history and performing a physical exam. An X-ray, CT scan, or MRI may be needed to determine if there were any associated injuries, such as broken bones (fractures) or internal injuries. TREATMENT  Often, the best treatment for a rib contusion is rest. Icing or applying cold compresses to the injured area may help reduce swelling and inflammation. Deep breathing exercises may be recommended to reduce the risk of partial lung collapse and pneumonia. Over-the-counter or prescription medicines may also be recommended for pain control. HOME CARE INSTRUCTIONS   Apply ice to the injured area:  Put ice in a plastic bag.  Place a towel between your skin and the bag.  Leave the ice on for 20 minutes, 2-3 times per day.  Take medicines only as directed by your health care provider.  Rest the injured area. Avoid strenuous activity and any activities or movements that cause pain. Be careful during activities and avoid bumping the injured area.  Perform deep-breathing exercises as directed by your health care provider.  Do not lift anything that is heavier than 5 lb (2.3 kg) until your  health care provider approves.  Do not use any tobacco products, including cigarettes, chewing tobacco, or electronic cigarettes. If you need help quitting, ask your health care provider. SEEK MEDICAL CARE IF:   You have increased bruising or swelling.  You have pain that is not controlled with treatment.  You have a fever. SEEK IMMEDIATE MEDICAL CARE IF:   You have difficulty breathing or shortness of breath.  You develop a continual cough, or you cough up thick or bloody sputum.  You feel sick to your stomach (nauseous), you throw up (vomit), or you have abdominal pain.   This information is not intended to replace advice given to you by your health care provider. Make sure you discuss any questions you have with your health care provider.   Document Released: 05/25/2001 Document Revised: 09/20/2014 Document Reviewed: 06/11/2014 Elsevier Interactive Patient Education 2016 [employer]  Cryotherapy Cryotherapy is when you put ice on your injury. Ice helps lessen pain and puffiness (swelling) after an injury. Ice works the best when you start using it in the first 24 to 48 hours after an injury. HOME CARE  Put a dry or damp towel between the ice pack and your skin.  You may press gently on the ice pack.  Leave the ice on for no more than 10 to 20 minutes at a time.  Check your skin after 5 minutes to make sure your skin is okay.  Rest at least 20 minutes between ice pack uses.  Stop using ice when your skin  loses feeling (numbness).  Do not use ice on someone who cannot tell you when it hurts. This includes small children and people with memory problems (dementia). GET HELP RIGHT AWAY IF:  You have white spots on your skin.  Your skin turns blue or pale.  Your skin feels waxy or hard.  Your puffiness gets worse. MAKE SURE YOU:   Understand these instructions.  Will watch your condition.  Will get help right away if you are not doing well or get worse.   This  information is not intended to replace advice given to you by your health care provider. Make sure you discuss any questions you have with your health care provider.   Document Released: 02/16/2008 Document Revised: 11/22/2011 Document Reviewed: 04/22/2011 Elsevier Interactive Patient Education [employer] Inc.

## 2015-12-14 ENCOUNTER — Emergency Department: Payer: Medicaid Other

## 2015-12-14 ENCOUNTER — Encounter: Payer: Self-pay | Admitting: Emergency Medicine

## 2015-12-14 ENCOUNTER — Emergency Department
Admission: EM | Admit: 2015-12-14 | Discharge: 2015-12-14 | Disposition: A | Discharge status: Home / Self Care | Payer: Medicaid Other | Attending: Student | Admitting: Student

## 2015-12-14 DIAGNOSIS — R109 Unspecified abdominal pain: Secondary | ICD-10-CM

## 2015-12-14 DIAGNOSIS — B349 Viral infection, unspecified: Secondary | ICD-10-CM

## 2015-12-14 DIAGNOSIS — S301XXA Contusion of abdominal wall, initial encounter: Secondary | ICD-10-CM | POA: Insufficient documentation

## 2015-12-14 DIAGNOSIS — S20212D Contusion of left front wall of thorax, subsequent encounter: Secondary | ICD-10-CM | POA: Insufficient documentation

## 2015-12-14 DIAGNOSIS — Y999 Unspecified external cause status: Secondary | ICD-10-CM | POA: Diagnosis not present

## 2015-12-14 DIAGNOSIS — W1789XD Other fall from one level to another, subsequent encounter: Secondary | ICD-10-CM | POA: Insufficient documentation

## 2015-12-14 DIAGNOSIS — Y929 Unspecified place or not applicable: Secondary | ICD-10-CM | POA: Insufficient documentation

## 2015-12-14 DIAGNOSIS — Y939 Activity, unspecified: Secondary | ICD-10-CM | POA: Diagnosis not present

## 2015-12-14 DIAGNOSIS — S29001A Unspecified injury of muscle and tendon of front wall of thorax, initial encounter: Secondary | ICD-10-CM | POA: Diagnosis present

## 2015-12-14 DIAGNOSIS — N390 Urinary tract infection, site not specified: Secondary | ICD-10-CM | POA: Insufficient documentation

## 2015-12-14 LAB — BASIC METABOLIC PANEL
ANION GAP: 4 — AB (ref 5–15)
BUN: 14 mg/dL (ref 6–20)
CHLORIDE: 105 mmol/L (ref 101–111)
CO2: 25 mmol/L (ref 22–32)
CREATININE: 0.89 mg/dL (ref 0.44–1.00)
Calcium: 9.1 mg/dL (ref 8.9–10.3)
GFR calc non Af Amer: >60 mL/min (ref >60)
Glucose, Bld: 86 mg/dL (ref 65–99)
POTASSIUM: 3.6 mmol/L (ref 3.5–5.1)
SODIUM: 134 mmol/L — AB (ref 135–145)

## 2015-12-14 LAB — URINALYSIS COMPLETE WITH MICROSCOPIC (ARMC ONLY)
Bilirubin Urine: NEGATIVE
Glucose, UA: NEGATIVE mg/dL
Hgb urine dipstick: NEGATIVE
KETONES UR: NEGATIVE mg/dL
NITRITE: NEGATIVE
PROTEIN: 30 mg/dL — AB
SPECIFIC GRAVITY, URINE: 1.028 (ref 1.005–1.030)
pH: 6 (ref 5.0–8.0)

## 2015-12-14 LAB — CBC
HCT: 39.9 % (ref 35.0–47.0)
HEMOGLOBIN: 13.7 g/dL (ref 12.0–16.0)
MCH: 29.3 pg (ref 26.0–34.0)
MCHC: 34.4 g/dL (ref 32.0–36.0)
MCV: 85.3 fL (ref 80.0–100.0)
Platelets: 168 10*3/uL (ref 150–440)
RBC: 4.67 MIL/uL (ref 3.80–5.20)
RDW: 13.5 % (ref 11.5–14.5)
WBC: 5 10*3/uL (ref 3.6–11.0)

## 2015-12-14 LAB — POCT PREGNANCY, URINE: Preg Test, Ur: NEGATIVE

## 2015-12-14 MED ORDER — CIPROFLOXACIN HCL 500 MG PO TABS: 500.0000 mg | ORAL_TABLET | Freq: Two times a day (BID) | ORAL | Status: AC

## 2015-12-14 MED ORDER — HYDROCODONE-ACETAMINOPHEN 5-325 MG PO TABS: 1.0000 | ORAL_TABLET | Freq: Four times a day (QID) | ORAL | Status: DC | PRN

## 2015-12-14 MED ORDER — IBUPROFEN 800 MG PO TABS: 800.0000 mg | ORAL_TABLET | Freq: Three times a day (TID) | ORAL | Status: DC | PRN

## 2015-12-14 MED ORDER — IOPAMIDOL (ISOVUE-370) INJECTION 76%
100.0000 mL | Freq: Once | INTRAVENOUS | Status: AC | PRN
  Administered 2015-12-14: 100 mL via INTRAVENOUS
  Filled 2015-12-14: qty 100

## 2015-12-14 NOTE — Discharge Instructions (Signed)
Blunt Abdominal Trauma Blunt abdominal trauma is a type of injury that involves damage to the abdominal wall or to abdominal organs, such as the liver or spleen. The damage can involve bruising, tearing, or a rupture. This type of injury does not involve a puncture of the skin. Blunt abdominal trauma can range from mild to severe. In some cases it can lead to a severe abdominal inflammation (peritonitis), severe bleeding, and a dangerous drop in blood pressure. CAUSES This injury is caused by a hard, direct hit to the abdomen. It can happen after:  A motor vehicle accident.  Being kicked or punched in the abdomen.  Falling from a significant height. RISK FACTORS This injury is more likely to happen in people who:  Play contact sports.  Work in a job in which falls or injuries are more likely, such as in Architect. SYMPTOMS The main symptom of this condition is pain in the abdomen. Other symptoms depend on the type and location of the injury. They can include:  Abdominal pain that spreads to the the back or shoulder.  Bruising.  Swelling.  Pain when pressing on the abdomen.  Blood in the urine.  Weakness.  Confusion.  Loss of consciousness.  Pale, dusky, cool, or sweaty skin.  Vomiting blood.  Bloody stool or bleeding from the rectum.  Trouble breathing. Symptoms of this injury can develop suddenly or slowly.  DIAGNOSIS This injury is diagnosed based on your symptoms and a physical exam. You may also have tests, including:  Blood tests.  Urine tests.  Imaging tests, such as:  A CT scan and ultrasound of your abdomen.  X-rays of your chest and abdomen.  A test in which a tube is used to flush your abdomen with fluid and check for blood (diagnostic peritoneal lavage). TREATMENT Treatment for this injury depends on its type and severity. Treatment options include:  Observation. If the injury is mild, this may be the only treatment needed.  Support of your  blood pressure and breathing.  Getting blood, fluids, or medicine through an IV tube.  Antibiotic medicine.  Insertion of tubes into the stomach or bladder.  A blood transfusion.  A procedure to stop bleeding. This involves putting a long, thin tube (catheter) into one of your blood vessels (angiographic embolization).  Surgery to open up your abdomen and control bleeding or repair damage (laparotomy). This may be done if tests suggest that you have peritonitis or bleeding that cannot be controlled with angiographic embolization. HOME CARE INSTRUCTIONS  Take medicines only as directed by your health care provider.  If you were prescribed an antibiotic medicine, finish all of it even if you start to feel better.  Follow your health care provider's instructions about diet and activity restrictions.  Keep all follow-up visits as directed by your health care provider. This is important. SEEK MEDICAL CARE IF:  You continue to have abdominal pain.  Your symptoms return.  You develop new symptoms.  You have blood in your urine or your bowel movements. SEEK IMMEDIATE MEDICAL CARE IF:  You vomit blood.  You have heavy bleeding from your rectum.  You have very bad abdominal pain.  You have trouble breathing.  You have chest pain.  You have a fever.  You have dizziness.  You pass out.   This information is not intended to replace advice given to you by your health care provider. Make sure you discuss any questions you have with your health care provider.   Document Released:  10/07/2004 Document Revised: 01/14/2015 Document Reviewed: 08/21/2014 Elsevier Interactive Patient Education 2016 [employer]  Contusion A contusion is a deep bruise. Contusions are the result of a blunt injury to tissues and muscle fibers under the skin. The injury causes bleeding under the skin. The skin overlying the contusion may turn blue, purple, or yellow. Minor injuries will give you a  painless contusion, but more severe contusions may stay painful and swollen for a few weeks.  CAUSES  This condition is usually caused by a blow, trauma, or direct force to an area of the body. SYMPTOMS  Symptoms of this condition include:  Swelling of the injured area.  Pain and tenderness in the injured area.  Discoloration. The area may have redness and then turn blue, purple, or yellow. DIAGNOSIS  This condition is diagnosed based on a physical exam and medical history. An X-ray, CT scan, or MRI may be needed to determine if there are any associated injuries, such as broken bones (fractures). TREATMENT  Specific treatment for this condition depends on what area of the body was injured. In general, the best treatment for a contusion is resting, icing, applying pressure to (compression), and elevating the injured area. This is often called the RICE strategy. Over-the-counter anti-inflammatory medicines may also be recommended for pain control.  HOME CARE INSTRUCTIONS   Rest the injured area.  If directed, apply ice to the injured area:  Put ice in a plastic bag.  Place a towel between your skin and the bag.  Leave the ice on for 20 minutes, 2-3 times per day.  If directed, apply light compression to the injured area using an elastic bandage. Make sure the bandage is not wrapped too tightly. Remove and reapply the bandage as directed by your health care provider.  If possible, raise (elevate) the injured area above the level of your heart while you are sitting or lying down.  Take over-the-counter and prescription medicines only as told by your health care provider. SEEK MEDICAL CARE IF:  Your symptoms do not improve after several days of treatment.  Your symptoms get worse.  You have difficulty moving the injured area. SEEK IMMEDIATE MEDICAL CARE IF:   You have severe pain.  You have numbness in a hand or foot.  Your hand or foot turns pale or cold.   This information  is not intended to replace advice given to you by your health care provider. Make sure you discuss any questions you have with your health care provider.   Document Released: 06/09/2005 Document Revised: 05/21/2015 Document Reviewed: 01/15/2015 Elsevier Interactive Patient Education 2016 Else[employer] Inc.  Cryotherapy Cryotherapy means treatment with cold. Ice or gel packs can be used to reduce both pain and swelling. Ice is the most helpful within the first 24 to 48 hours after an injury or flare-up from overusing a muscle or joint. Sprains, strains, spasms, burning pain, shooting pain, and aches can all be eased with ice. Ice can also be used when recovering from surgery. Ice is effective, has very few side effects, and is safe for most people to use. PRECAUTIONS  Ice is not a safe treatment option for people with:  Raynaud phenomenon. This is a condition affecting small blood vessels in the extremities. Exposure to cold may cause your problems to return.  Cold hypersensitivity. There are many forms of cold hypersensitivity, including:  Cold urticaria. Red, itchy hives appear on the skin when the tissues begin to warm after being iced.  Cold erythema. This  is a red, itchy rash caused by exposure to cold.  Cold hemoglobinuria. Red blood cells break down when the tissues begin to warm after being iced. The hemoglobin that carry oxygen are passed into the urine because they cannot combine with blood proteins fast enough.  Numbness or altered sensitivity in the area being iced. If you have any of the following conditions, do not use ice until you have discussed cryotherapy with your caregiver:  Heart conditions, such as arrhythmia, angina, or chronic heart disease.  High blood pressure.  Healing wounds or open skin in the area being iced.  Current infections.  Rheumatoid arthritis.  Poor circulation.  Diabetes. Ice slows the blood flow in the region it is applied. This is beneficial  when trying to stop inflamed tissues from spreading irritating chemicals to surrounding tissues. However, if you expose your skin to cold temperatures for too long or without the proper protection, you can damage your skin or nerves. Watch for signs of skin damage due to cold. HOME CARE INSTRUCTIONS Follow these tips to use ice and cold packs safely.  Place a dry or damp towel between the ice and skin. A damp towel will cool the skin more quickly, so you may need to shorten the time that the ice is used.  For a more rapid response, add gentle compression to the ice.  Ice for no more than 10 to 20 minutes at a time. The bonier the area you are icing, the less time it will take to get the benefits of ice.  Check your skin after 5 minutes to make sure there are no signs of a poor response to cold or skin damage.  Rest 20 minutes or more between uses.  Once your skin is numb, you can end your treatment. You can test numbness by very lightly touching your skin. The touch should be so light that you do not see the skin dimple from the pressure of your fingertip. When using ice, most people will feel these normal sensations in this order: cold, burning, aching, and numbness.  Do not use ice on someone who cannot communicate their responses to pain, such as small children or people with dementia. HOW TO MAKE AN ICE PACK Ice packs are the most common way to use ice therapy. Other methods include ice massage, ice baths, and cryosprays. Muscle creams that cause a cold, tingly feeling do not offer the same benefits that ice offers and should not be used as a substitute unless recommended by your caregiver. To make an ice pack, do one of the following:  Place crushed ice or a bag of frozen vegetables in a sealable plastic bag. Squeeze out the excess air. Place this bag inside another plastic bag. Slide the bag into a pillowcase or place a damp towel between your skin and the bag.  Mix 3 parts water with 1  part rubbing alcohol. Freeze the mixture in a sealable plastic bag. When you remove the mixture from the freezer, it will be slushy. Squeeze out the excess air. Place this bag inside another plastic bag. Slide the bag into a pillowcase or place a damp towel between your skin and the bag. SEEK MEDICAL CARE IF:  You develop white spots on your skin. This may give the skin a blotchy (mottled) appearance.  Your skin turns blue or pale.  Your skin becomes waxy or hard.  Your swelling gets worse. MAKE SURE YOU:   Understand these instructions.  Will watch your condition.  Will get help right away if you are not doing well or get worse.   This information is not intended to replace advice given to you by your health care provider. Make sure you discuss any questions you have with your health care provider.   Document Released: 04/26/2011 Document Revised: 09/20/2014 Document Reviewed: 04/26/2011 Elsevier Interactive Patient Education 2016 [employer] Inc.  Viral Infections A viral infection can be caused by different types of viruses.Most viral infections are not serious and resolve on their own. However, some infections may cause severe symptoms and may lead to further complications. SYMPTOMS Viruses can frequently cause:  Minor sore throat.  Aches and pains.  Headaches.  Runny nose.  Different types of rashes.  Watery eyes.  Tiredness.  Cough.  Loss of appetite.  Gastrointestinal infections, resulting in nausea, vomiting, and diarrhea. These symptoms do not respond to antibiotics because the infection is not caused by bacteria. However, you might catch a bacterial infection following the viral infection. This is sometimes called a "superinfection." Symptoms of such a bacterial infection may include:  Worsening sore throat with pus and difficulty swallowing.  Swollen neck glands.  Chills and a high or persistent fever.  Severe headache.  Tenderness over the  sinuses.  Persistent overall ill feeling (malaise), muscle aches, and tiredness (fatigue).  Persistent cough.  Yellow, green, or brown mucus production with coughing. HOME CARE INSTRUCTIONS   Only take over-the-counter or prescription medicines for pain, discomfort, diarrhea, or fever as directed by your caregiver.  Drink enough water and fluids to keep your urine clear or pale yellow. Sports drinks can provide valuable electrolytes, sugars, and hydration.  Get plenty of rest and maintain proper nutrition. Soups and broths with crackers or rice are fine. SEEK IMMEDIATE MEDICAL CARE IF:   You have severe headaches, shortness of breath, chest pain, neck pain, or an unusual rash.  You have uncontrolled vomiting, diarrhea, or you are unable to keep down fluids.  You or your child has an oral temperature above 102 F (38.9 C), not controlled by medicine.  Your baby is older than 3 months with a rectal temperature of 102 F (38.9 C) or higher.  Your baby is 883 months old or younger with a rectal temperature of 100.4 F (38 C) or higher. MAKE SURE YOU:   Understand these instructions.  Will watch your condition.  Will get help right away if you are not doing well or get worse.   This information is not intended to replace advice given to you by your health care provider. Make sure you discuss any questions you have with your health care provider.   Document Released: 06/09/2005 Document Revised: 11/22/2011 Document Reviewed: 02/05/2015 Elsevier Interactive Patient Education 2016 [employer] Inc[employer]  Urinary Tract Infection Urinary tract infections (UTIs) can develop anywhere along your urinary tract. Your urinary tract is your body's drainage system for removing wastes and extra water. Your urinary tract includes two kidneys, two ureters, a bladder, and a urethra. Your kidneys are a pair of bean-shaped organs. Each kidney is about the size of your fist. They are located below your ribs,  one on each side of your spine. CAUSES Infections are caused by microbes, which are microscopic organisms, including fungi, viruses, and bacteria. These organisms are so small that they can only be seen through a microscope. Bacteria are the microbes that most commonly cause UTIs. SYMPTOMS  Symptoms of UTIs may vary by age and gender of the patient and by the location of the  infection. Symptoms in young women typically include a frequent and intense urge to urinate and a painful, burning feeling in the bladder or urethra during urination. Older women and men are more likely to be tired, shaky, and weak and have muscle aches and abdominal pain. A fever may mean the infection is in your kidneys. Other symptoms of a kidney infection include pain in your back or sides below the ribs, nausea, and vomiting. DIAGNOSIS To diagnose a UTI, your caregiver will ask you about your symptoms. Your caregiver will also ask you to provide a urine sample. The urine sample will be tested for bacteria and white blood cells. White blood cells are made by your body to help fight infection. TREATMENT  Typically, UTIs can be treated with medication. Because most UTIs are caused by a bacterial infection, they usually can be treated with the use of antibiotics. The choice of antibiotic and length of treatment depend on your symptoms and the type of bacteria causing your infection. HOME CARE INSTRUCTIONS  If you were prescribed antibiotics, take them exactly as your caregiver instructs you. Finish the medication even if you feel better after you have only taken some of the medication.  Drink enough water and fluids to keep your urine clear or pale yellow.  Avoid caffeine, tea, and carbonated beverages. They tend to irritate your bladder.  Empty your bladder often. Avoid holding urine for long periods of time.  Empty your bladder before and after sexual intercourse.  After a bowel movement, women should cleanse from front  to back. Use each tissue only once. SEEK MEDICAL CARE IF:   You have back pain.  You develop a fever.  Your symptoms do not begin to resolve within 3 days. SEEK IMMEDIATE MEDICAL CARE IF:   You have severe back pain or lower abdominal pain.  You develop chills.  You have nausea or vomiting.  You have continued burning or discomfort with urination. MAKE SURE YOU:   Understand these instructions.  Will watch your condition.  Will get help right away if you are not doing well or get worse.   This information is not intended to replace advice given to you by your health care provider. Make sure you discuss any questions you have with your health care provider.   Document Released: 06/09/2005 Document Revised: 05/21/2015 Document Reviewed: 10/08/2011 Elsevier Interactive Patient Education 2016 [employer].   Please return to the emergency department immediately for any increase in pain, shortness of breath, difficulty breathing, coughing, increased abdominal pain or back pain.

## 2015-12-14 NOTE — ED Notes (Signed)
Patient has been seen at this facility for "Bruised Ribs" Arrives today with left rib pain s/p coughing.

## 2015-12-14 NOTE — ED Provider Notes (Signed)
CSN: [id number]     Arrival date & time 12/14/15  1017 History   First MD Initiated Contact with Patient 12/14/15 1028     Chief Complaint  Patient presents with  . Rib Injury     (Consider location/radiation/quality/duration/timing/severity/associated sxs/prior Treatment) HPI  25 year old female presents to emergency department for evaluation of left upper quadrant abdominal pain and left lower rib pain. She fell 8 days ago out of the back of a truck, injured her left ribs, initial rib x-ray showed no fracture on 12/06/2015. Patient has had continued pain and discomfort along the left ribs and left upper quadrant of the abdomen. She is not taking any medications for pain. Pain is 10 out of 10, she states it was increased yesterday after developing cough cold and congestion symptoms. Patient states she is having significant drainage down her throat, which is causing her to cough which increases her left lower rib pain. Patient describes occasional shortness of breath with coughing spells. She denies any fevers. Positive sore throat. She points to her pain along the left anterior axillary line along the last rib. Pain is increased with coughing and movement. Pain is improved with sitting still. She describes the pain as a stabbing pain. She denies any urinary symptoms,  no back pain, nausea, vomiting. Pain she is having today is the same pain that she had day of the injury.   History reviewed. No pertinent past medical history. Past Surgical History  Procedure Laterality Date  . Tonsillectomy     History reviewed. No pertinent family history. Social History  Substance Use Topics  . Smoking status: Never Smoker   . Smokeless tobacco: None  . Alcohol Use: No   OB History    Gravida Para Term Preterm AB TAB SAB Ectopic Multiple Living   1         1     Review of Systems  Constitutional: Negative for fever, chills, activity change and fatigue.  HENT: Positive for congestion and sore  throat. Negative for sinus pressure.   Eyes: Negative for visual disturbance.  Respiratory: Positive for cough. Negative for chest tightness, shortness of breath and wheezing.   Cardiovascular: Negative for chest pain and leg swelling.  Gastrointestinal: Positive for abdominal pain (left upper quadrant). Negative for nausea, vomiting and diarrhea.  Genitourinary: Negative for dysuria and difficulty urinating.  Musculoskeletal: Negative for arthralgias and gait problem.  Skin: Negative for rash.  Neurological: Negative for weakness, numbness and headaches.  Hematological: Negative for adenopathy.  Psychiatric/Behavioral: Negative for behavioral problems, confusion and agitation.      Allergies  Review of patient's allergies indicates no known allergies.  Home Medications   Prior to Admission medications   Medication Sig Start Date End Date Taking? Authorizing Provider  butalbital-acetaminophen-caffeine (FIORICET) 50-325-40 MG tablet Take 1-2 tablets by mouth every 6 (six) hours as needed for headache. 10/28/15 10/27/16  Minna AntisKevin Paduchowski, MD  famotidine (PEPCID) 20 MG tablet Take 1 tablet (20 mg total) by mouth 2 (two) times daily. 05/25/15   Irean HongJade J Sung, MD  HYDROcodone-acetaminophen (NORCO) 5-325 MG tablet Take 1 tablet by mouth every 6 (six) hours as needed for moderate pain. 12/14/15   Evon Slackhomas C Trevyn Lumpkin, PA-C  ibuprofen (ADVIL,MOTRIN) 800 MG tablet Take 1 tablet (800 mg total) by mouth every 8 (eight) hours as needed. 12/14/15   Evon Slackhomas C Fabien Travelstead, PA-C  ondansetron (ZOFRAN ODT) 4 MG disintegrating tablet Take 1 tablet (4 mg total) by mouth every 8 (eight) hours as needed for  nausea or vomiting. 10/28/15   Minna Antis, MD  PRESCRIPTION MEDICATION Take 1 tablet by mouth daily. Birth control (norno?)    Historical Provider, MD  promethazine (PHENERGAN) 12.5 MG tablet Take 1 tablet (12.5 mg total) by mouth every 6 (six) hours as needed for nausea or vomiting. 10/28/15   Minna Antis, MD   promethazine (PHENERGAN) 25 MG tablet Take 1 tablet (25 mg total) by mouth every 6 (six) hours as needed for nausea or vomiting. 10/28/15   Minna Antis, MD  ranitidine (ZANTAC) 75 MG tablet Take 1 tablet (75 mg total) by mouth 2 (two) times daily. 08/03/15   Myrna Blazer, MD   BP 136/66 mmHg  Pulse 82  Temp(Src) 98.8 F (37.1 C) (Oral)  Resp 18  Ht  (1.676 m)  Wt 93.895 kg  BMI 33.43 kg/m2  SpO2 100%  LMP 11/11/2015 Physical Exam  Constitutional: She is oriented to person, place, and time. She appears well-developed and well-nourished. No distress.  HENT:  Head: Normocephalic and atraumatic.  Right Ear: External ear normal.  Left Ear: External ear normal.  Mouth/Throat: Oropharynx is clear and moist. No oropharyngeal exudate.  Eyes: EOM are normal. Pupils are equal, round, and reactive to light. Right eye exhibits no discharge. Left eye exhibits no discharge.  Neck: Normal range of motion. Neck supple.  Cardiovascular: Normal rate, regular rhythm and intact distal pulses.   Pulmonary/Chest: Effort normal and breath sounds normal. No respiratory distress. She has no wheezes. She has no rales. She exhibits no tenderness.  Abdominal: Soft. She exhibits no distension and no mass. There is tenderness (left upper quadrant, severe tenderness to palpation under the rib). There is guarding. There is no rebound.  Musculoskeletal:  Examination of the cervical thoracic and lumbar spine show patient has no spinous process tenderness. She has no tenderness on the left flank or posterior ribs. She has tenderness along the last rib on the left anterior axillary line that is moderately tender to palpation. She is significant tenderness to left upper quadrant of the abdomen with deep palpation. No lower extremity swelling or edema.  Lymphadenopathy:    She has cervical adenopathy (posterior cervical).  Neurological: She is alert and oriented to person, place, and time. She has  normal reflexes.  Skin: Skin is warm and dry.  Psychiatric: She has a normal mood and affect. Her behavior is normal. Thought content normal.    ED Course  Procedures (including critical care time) Labs Review Labs Reviewed  BASIC METABOLIC PANEL - Abnormal; Notable for the following:    Sodium 134 (*)    Anion gap 4 (*)    All other components within normal limits  URINALYSIS COMPLETEWITH MICROSCOPIC ([facility] ONLY) - Abnormal; Notable for the following:    Color, Urine YELLOW (*)    APPearance HAZY (*)    Protein, ur 30 (*)    Leukocytes, UA 1+ (*)    Bacteria, UA RARE (*)    Squamous Epithelial / LPF 6-30 (*)    All other components within normal limits  CBC  POC URINE PREG, ED  POCT PREGNANCY, URINE    Imaging Review Dg Chest 2 View  12/14/2015  CLINICAL DATA:  Shortness of breath.  Pain after coughing. EXAM: CHEST  2 VIEW COMPARISON:  12/06/2015 rib series FINDINGS: The cardiomediastinal silhouette is within normal limits. The lungs are well inflated and clear. There is no evidence of pleural effusion or pneumothorax. No acute osseous abnormality is identified. IMPRESSION: No active  cardiopulmonary disease. Electronically Signed   By: Sebastian Ache M.D.   On: 12/14/2015 11:09   Ct Abdomen Pelvis W Contrast  12/14/2015  CLINICAL DATA:  25 year old female with left lower rib pain after falling 1 week ago. EXAM: CT ABDOMEN AND PELVIS WITH CONTRAST TECHNIQUE: Multidetector CT imaging of the abdomen and pelvis was performed using the standard protocol following bolus administration of intravenous contrast. CONTRAST:  100 cc Isovue 370 COMPARISON:  None. FINDINGS: Lower chest: No acute findings. No fracture seen within the lower ribs. Hepatobiliary: No masses or other significant abnormality. Pancreas: No mass, inflammatory changes, or other significant abnormality. Spleen: Within normal limits in size and appearance. Adrenals/Urinary Tract: No masses identified. No evidence of  hydronephrosis. Stomach/Bowel: Bowel is normal in caliber. No bowel wall thickening or evidence of bowel wall inflammation. No evidence of bowel injury. Appendix is normal. Vascular/Lymphatic: No pathologically enlarged lymph nodes. No evidence of abdominal aortic aneurysm. Reproductive: No mass or other significant abnormality. Other: None. Musculoskeletal: No acute or suspicious osseous lesion. No osseous fracture or dislocation. Superficial soft tissues are unremarkable. IMPRESSION: Normal abdomen and pelvis CT. Electronically Signed   By: Bary Richard M.D.   On: 12/14/2015 11:58   I have personally reviewed and evaluated these images and lab results as part of my medical decision-making.   EKG Interpretation None      MDM   Final diagnoses:  Abdominal pain  Abdominal contusion, initial encounter  Rib contusion, left, subsequent encounter  Viral illness    25 year old female with trauma to the left ribs 8 days ago. She's had continued pain that has worsened over the last 24 hours due to new onset cough congestion. Patient has significant discomfort to the left upper quadrant of the abdomen and last rib on the left along the anterior axillary line. Chest x-rays negative for pneumothorax. Labs and vital signs were all within normal limits showing no tachycardia or increased respirations or decreased O2 saturations. There is concern for splenic injury, CT of the abdomen and pelvis with contrast was unremarkable. Urinalysis indicated mild urinary tract infection, will treat with antibiotics. She is given prescription for pain medication, Norco 5-3 25, one tab by mouth every 6 hours as needed for pain. She is office given a prescription for ibuprofen 800 mg 3 times a day when necessary pain. She will return to the emergency department for any increase in pain, shortness of breath, difficulty breathing, fevers.    Evon Slack, PA-C 12/14/15 1315  Evon Slack, PA-C 12/14/15  1316  Evon Slack, PA-C 12/14/15 1401  Evon Slack, PA-C 12/14/15 1402  Gayla Doss, MD 12/14/15 [phone]

## 2015-12-14 NOTE — ED Notes (Signed)
NAD noted at time of D/C. Pt denies questions or concerns. Pt taken to the lobby at this time via wheelchair.   

## 2016-03-12 ENCOUNTER — Emergency Department
Admission: EM | Admit: 2016-03-12 | Discharge: 2016-03-12 | Disposition: A | Discharge status: Home / Self Care | Payer: Medicaid Other | Attending: Emergency Medicine | Admitting: Emergency Medicine

## 2016-03-12 DIAGNOSIS — Z79899 Other long term (current) drug therapy: Secondary | ICD-10-CM | POA: Insufficient documentation

## 2016-03-12 DIAGNOSIS — M7541 Impingement syndrome of right shoulder: Secondary | ICD-10-CM | POA: Insufficient documentation

## 2016-03-12 MED ORDER — MELOXICAM 15 MG PO TABS: 15.0000 mg | ORAL_TABLET | Freq: Every day | ORAL | Status: DC

## 2016-03-12 NOTE — ED Provider Notes (Signed)
[facility] Emergency Department Provider Note  ____________________________________________  Time seen: Approximately 5:04 PM  I have reviewed the triage vital signs and the nursing notes.   HISTORY  Chief Complaint Shoulder Pain    HPI April Tran is a 25 y.o. female who presents to emergency department complaining of right shoulder pain. Patient states that she picked up a heavy item at work several days ago and felt a "pop" to her right shoulder. Patient states that she had some pain that was described as a aching sensation to the anterior portion of her shoulder. Patient was taking Tylenol and states that symptoms were improving until today she lifted another heavy item and felt another pop. Patient denies any trauma to the area. She denies any pain in her neck or radiating down her arm. All pain is located in the anterior portion over the [facility] joint.   No past medical history on file.  There are no active problems to display for this patient.   Past Surgical History  Procedure Laterality Date  . Tonsillectomy      Current Outpatient Rx  Name  Route  Sig  Dispense  Refill  . butalbital-acetaminophen-caffeine (FIORICET) 50-325-40 MG tablet   Oral   Take 1-2 tablets by mouth every 6 (six) hours as needed for headache.   20 tablet   0   . famotidine (PEPCID) 20 MG tablet   Oral   Take 1 tablet (20 mg total) by mouth 2 (two) times daily.   60 tablet   0   . HYDROcodone-acetaminophen (NORCO) 5-325 MG tablet   Oral   Take 1 tablet by mouth every 6 (six) hours as needed for moderate pain.   15 tablet   0   . ibuprofen (ADVIL,MOTRIN) 800 MG tablet   Oral   Take 1 tablet (800 mg total) by mouth every 8 (eight) hours as needed.   30 tablet   0   . meloxicam (MOBIC) 15 MG tablet   Oral   Take 1 tablet (15 mg total) by mouth daily.   30 tablet   0   . ondansetron (ZOFRAN ODT) 4 MG disintegrating tablet   Oral   Take 1 tablet (4 mg  total) by mouth every 8 (eight) hours as needed for nausea or vomiting.   20 tablet   0   . PRESCRIPTION MEDICATION   Oral   Take 1 tablet by mouth daily. Birth control (norno?)         . promethazine (PHENERGAN) 12.5 MG tablet   Oral   Take 1 tablet (12.5 mg total) by mouth every 6 (six) hours as needed for nausea or vomiting.   20 tablet   0   . promethazine (PHENERGAN) 25 MG tablet   Oral   Take 1 tablet (25 mg total) by mouth every 6 (six) hours as needed for nausea or vomiting.   20 tablet   0   . ranitidine (ZANTAC) 75 MG tablet   Oral   Take 1 tablet (75 mg total) by mouth 2 (two) times daily.   30 tablet   0     Allergies Review of patient's allergies indicates no known allergies.  No family history on file.  Social History Social History  Substance Use Topics  . Smoking status: Never Smoker   . Smokeless tobacco: Not on file  . Alcohol Use: No     Review of Systems  Constitutional: No fever/chills Cardiovascular: no chest pain. Respiratory:  no cough. No SOB. Gastrointestinal: No abdominal pain.  No nausea, no vomiting.  No diarrhea.  No constipation. Musculoskeletal: Positive for right shoulder pain. Skin: Negative for rash, abrasions, lacerations, ecchymosis. Neurological: Negative for headaches, focal weakness or numbness. 10-point ROS otherwise negative.  ____________________________________________   PHYSICAL EXAM:  VITAL SIGNS: ED Triage Vitals  Enc Vitals Group     BP 03/12/16 1627 140/86 mmHg     Pulse Rate 03/12/16 1627 87     Resp 03/12/16 1627 18     Temp 03/12/16 1627 98.1 F (36.7 C)     Temp Source 03/12/16 1627 Oral     SpO2 03/12/16 1627 98 %     Weight 03/12/16 1627 207 lb (93.895 kg)     Height 03/12/16 1627  (1.676 m)     Head Cir --      Peak Flow --      Pain Score 03/12/16 1628 5     Pain Loc --      Pain Edu? --      Excl. in GC? --      Constitutional: Alert and oriented. Well appearing and in no  acute distress. Eyes: Conjunctivae are normal. PERRL. EOMI. Head: Atraumatic. Neck: No stridor.  No cervical spine tenderness to palpation. Neck is supple with full range of motion.  Cardiovascular: Normal rate, regular rhythm. Normal S1 and S2.  Good peripheral circulation. Respiratory: Normal respiratory effort without tachypnea or retractions. Lungs CTAB. Good air entry to the bases with no decreased or absent breath sounds. Musculoskeletal: Full range of motion to all extremities. No gross deformities appreciated. No visible foreign cigarettes sharp inspection. Full range of motion in her shoulder. Patient is tender to palpation over the [facility] joint. No palpable abnormality. Positive Neer's test. Radial pulses intact distally. Sensation intact distally. Neurologic:  Normal speech and language. No gross focal neurologic deficits are appreciated.  Skin:  Skin is warm, dry and intact. No rash noted. Psychiatric: Mood and affect are normal. Speech and behavior are normal. Patient exhibits appropriate insight and judgement.   ____________________________________________   LABS (all labs ordered are listed, but only abnormal results are displayed)  Labs Reviewed - No data to display ____________________________________________  EKG   ____________________________________________  RADIOLOGY   No results found.  ____________________________________________    PROCEDURES  Procedure(s) performed:       Medications - No data to display   ____________________________________________   INITIAL IMPRESSION / ASSESSMENT AND PLAN / ED COURSE  Pertinent labs & imaging results that were available during my care of the patient were reviewed by me and considered in my medical decision making (see chart for details).  Patient's diagnosis is consistent with impingement syndrome on the right shoulder. No indications for imaging at this time. Exam is reassuring.. Patient will be  discharged home with prescriptions for anti-inflammatories for symptom control. Patient is to follow up with orthopedics as needed or otherwise directed. Patient is given ED precautions to return to the ED for any worsening or new symptoms.     ____________________________________________  FINAL CLINICAL IMPRESSION(S) / ED DIAGNOSES  Final diagnoses:  Impingement syndrome of right shoulder      NEW MEDICATIONS STARTED DURING THIS VISIT:  New Prescriptions   MELOXICAM (MOBIC) 15 MG TABLET    Take 1 tablet (15 mg total) by mouth daily.        This chart was dictated using voice recognition software/Dragon. Despite best efforts to proofread, errors can occur which can  change the meaning. Any change was purely unintentional.    Racheal PatchesJonathan D Noel Henandez, PA-C 03/12/16 1714  Phineas SemenGraydon Goodman, MD 03/12/16 2038

## 2016-03-12 NOTE — Discharge Instructions (Signed)
Impingement Syndrome, Rotator Cuff, Bursitis  Impingement syndrome is a condition that involves inflammation of the tendons of the rotator cuff and the subacromial bursa, that causes pain in the shoulder. The rotator cuff consists of four tendons and muscles that control much of the shoulder and upper arm function. The subacromial bursa is a fluid filled sac that helps reduce friction between the rotator cuff and one of the bones of the shoulder (acromion). Impingement syndrome is usually an overuse injury that causes swelling of the bursa (bursitis), swelling of the tendon (tendonitis), and/or a tear of the tendon (strain). Strains are classified into three categories. Grade 1 strains cause pain, but the tendon is not lengthened. Grade 2 strains include a lengthened ligament, due to the ligament being stretched or partially ruptured. With grade 2 strains there is still function, although the function may be decreased. Grade 3 strains include a complete tear of the tendon or muscle, and function is usually impaired.  SYMPTOMS   · Pain around the shoulder, often at the outer portion of the upper arm.  · Pain that gets worse with shoulder function, especially when reaching overhead or lifting.  · Sometimes, aching when not using the arm.  · Pain that wakes you up at night.  · Sometimes, tenderness, swelling, warmth, or redness over the affected area.  · Loss of strength.  · Limited motion of the shoulder, especially reaching behind the back (to the back pocket or to unhook bra) or across your body.  · Crackling sound (crepitation) when moving the arm.  · Biceps tendon pain and inflammation (in the front of the shoulder). Worse when bending the elbow or lifting.  CAUSES   Impingement syndrome is often an overuse injury, in which chronic (repetitive) motions cause the tendons or bursa to become inflamed. A strain occurs when a force is paced on the tendon or muscle that is greater than it can withstand. Common  mechanisms of injury include:  Stress from sudden increase in duration, frequency, or intensity of training.  · Direct hit (trauma) to the shoulder.  · Aging, erosion of the tendon with normal use.  · Bony bump on shoulder (acromial spur).  RISK INCREASES WITH:  · Contact sports (football, wrestling, boxing).  · Throwing sports (baseball, tennis, volleyball).  · Weightlifting and bodybuilding.  · Heavy labor.  · Previous injury to the rotator cuff, including impingement.  · Poor shoulder strength and flexibility.  · Failure to warm up properly before activity.  · Inadequate protective equipment.  · Old age.  · Bony bump on shoulder (acromial spur).  PREVENTION   · Warm up and stretch properly before activity.  · Allow for adequate recovery between workouts.  · Maintain physical fitness:    Strength, flexibility, and endurance.    Cardiovascular fitness.  · Learn and use proper exercise technique.  PROGNOSIS   If treated properly, impingement syndrome usually goes away within 6 weeks. Sometimes surgery is required.   RELATED COMPLICATIONS   · Longer healing time if not properly treated, or if not given enough time to heal.  · Recurring symptoms, that result in a chronic condition.  · Shoulder stiffness, frozen shoulder, or loss of motion.  · Rotator cuff tendon tear.  · Recurring symptoms, especially if activity is resumed too soon, with overuse, with a direct blow, or when using poor technique.  TREATMENT   Treatment first involves the use of ice and medicine, to reduce pain and inflammation. The use of strengthening   and stretching exercises may help reduce pain with activity. These exercises may be performed at home or with a therapist. If non-surgical treatment is unsuccessful after more than 6 months, surgery may be advised. After surgery and rehabilitation, activity is usually possible in 3 months.   MEDICATION  · If pain medicine is needed, nonsteroidal anti-inflammatory medicines (aspirin and ibuprofen), or  other minor pain relievers (acetaminophen), are often advised.  · Do not take pain medicine for 7 days before surgery.  · Prescription pain relievers may be given, if your caregiver thinks they are needed. Use only as directed and only as much as you need.  · Corticosteroid injections may be given by your caregiver. These injections should be reserved for the most serious cases, because they may only be given a certain number of times.  HEAT AND COLD  · Cold treatment (icing) should be applied for 10 to 15 minutes every 2 to 3 hours for inflammation and pain, and immediately after activity that aggravates your symptoms. Use ice packs or an ice massage.  · Heat treatment may be used before performing stretching and strengthening activities prescribed by your caregiver, physical therapist, or athletic trainer. Use a heat pack or a warm water soak.  SEEK MEDICAL CARE IF:   · Symptoms get worse or do not improve in 4 to 6 weeks, despite treatment.  New, unexplained symptoms develop. (Drugs used in treatment may produce side effects.)

## 2016-03-12 NOTE — ED Notes (Signed)
Pt states that she has injured her shoulder for the second time, states that initially she picked up a tea urn that caused her shoulder to pop, and pt states that she pulled a table at work today and it popped again, pt has rom to the arm but states is painful, pt has not decided if she wants to file worker's comp at this time

## 2016-04-17 ENCOUNTER — Emergency Department
Admission: EM | Admit: 2016-04-17 | Discharge: 2016-04-18 | Disposition: A | Discharge status: Home / Self Care | Payer: Medicaid Other | Attending: Emergency Medicine | Admitting: Emergency Medicine

## 2016-04-17 ENCOUNTER — Encounter: Payer: Self-pay | Admitting: *Deleted

## 2016-04-17 ENCOUNTER — Emergency Department: Payer: Medicaid Other

## 2016-04-17 DIAGNOSIS — O99611 Diseases of the digestive system complicating pregnancy, first trimester: Secondary | ICD-10-CM | POA: Insufficient documentation

## 2016-04-17 DIAGNOSIS — O200 Threatened abortion: Secondary | ICD-10-CM | POA: Insufficient documentation

## 2016-04-17 DIAGNOSIS — Z3A01 Less than 8 weeks gestation of pregnancy: Secondary | ICD-10-CM | POA: Insufficient documentation

## 2016-04-17 DIAGNOSIS — R197 Diarrhea, unspecified: Secondary | ICD-10-CM | POA: Insufficient documentation

## 2016-04-17 DIAGNOSIS — O418X10 Other specified disorders of amniotic fluid and membranes, first trimester, not applicable or unspecified: Secondary | ICD-10-CM

## 2016-04-17 DIAGNOSIS — O219 Vomiting of pregnancy, unspecified: Secondary | ICD-10-CM | POA: Diagnosis not present

## 2016-04-17 DIAGNOSIS — O468X1 Other antepartum hemorrhage, first trimester: Secondary | ICD-10-CM

## 2016-04-17 DIAGNOSIS — O208 Other hemorrhage in early pregnancy: Secondary | ICD-10-CM | POA: Diagnosis present

## 2016-04-17 LAB — WET PREP, GENITAL
CLUE CELLS WET PREP: NONE SEEN
SPERM: NONE SEEN
Trich, Wet Prep: NONE SEEN
YEAST WET PREP: NONE SEEN

## 2016-04-17 LAB — COMPREHENSIVE METABOLIC PANEL
ALBUMIN: 4.1 g/dL (ref 3.5–5.0)
ALT: 21 U/L (ref 14–54)
ANION GAP: 6 (ref 5–15)
AST: 24 U/L (ref 15–41)
Alkaline Phosphatase: 61 U/L (ref 38–126)
BUN: 13 mg/dL (ref 6–20)
CHLORIDE: 105 mmol/L (ref 101–111)
CO2: 26 mmol/L (ref 22–32)
Calcium: 9.4 mg/dL (ref 8.9–10.3)
Creatinine, Ser: 0.87 mg/dL (ref 0.44–1.00)
GFR calc Af Amer: >60 mL/min (ref >60)
GFR calc non Af Amer: >60 mL/min (ref >60)
GLUCOSE: 105 mg/dL — AB (ref 65–99)
POTASSIUM: 3.4 mmol/L — AB (ref 3.5–5.1)
Sodium: 137 mmol/L (ref 135–145)
TOTAL PROTEIN: 7.6 g/dL (ref 6.5–8.1)
Total Bilirubin: 0.6 mg/dL (ref 0.3–1.2)

## 2016-04-17 LAB — CBC WITH DIFFERENTIAL/PLATELET
BASOS ABS: 0 10*3/uL (ref 0–0.1)
BASOS PCT: 0 %
EOS ABS: 0.1 10*3/uL (ref 0–0.7)
EOS PCT: 2 %
HCT: 39.3 % (ref 35.0–47.0)
Hemoglobin: 13.4 g/dL (ref 12.0–16.0)
Lymphocytes Relative: 32 %
Lymphs Abs: 2.4 10*3/uL (ref 1.0–3.6)
MCH: 29 pg (ref 26.0–34.0)
MCHC: 34.2 g/dL (ref 32.0–36.0)
MCV: 84.9 fL (ref 80.0–100.0)
MONO ABS: 0.5 10*3/uL (ref 0.2–0.9)
MONOS PCT: 6 %
Neutro Abs: 4.5 10*3/uL (ref 1.4–6.5)
Neutrophils Relative %: 60 %
PLATELETS: 156 10*3/uL (ref 150–440)
RBC: 4.63 MIL/uL (ref 3.80–5.20)
RDW: 14 % (ref 11.5–14.5)
WBC: 7.6 10*3/uL (ref 3.6–11.0)

## 2016-04-17 LAB — CHLAMYDIA/NGC RT PCR (ARMC ONLY)
CHLAMYDIA TR: NOT DETECTED
N GONORRHOEAE: NOT DETECTED

## 2016-04-17 LAB — POC URINE PREG, ED: PREG TEST UR: POSITIVE — AB

## 2016-04-17 LAB — HCG, QUANTITATIVE, PREGNANCY: HCG, BETA CHAIN, QUANT, S: 5945 m[IU]/mL — AB (ref <5)

## 2016-04-17 MED ORDER — ACETAMINOPHEN 500 MG PO TABS
1000.0000 mg | ORAL_TABLET | Freq: Once | ORAL | Status: AC
  Administered 2016-04-17: 1000 mg via ORAL
  Filled 2016-04-17: qty 2

## 2016-04-17 NOTE — ED Triage Notes (Signed)
Pt arrived to ED reporting that she is pregnant but does not know how far along and has been having abd cramping since yesterday that worsens with activity. Pt denies vaginal bleeding but reports having clear discharge since yesterday. Pt reports having taken warm baths and has not been able to decrease cramping.  Pt denies vomiting but reports having nausea at this time.

## 2016-04-17 NOTE — ED Provider Notes (Signed)
[facility] Emergency Department Provider Note        Time seen: ----------------------------------------- 8:38 PM on 04/17/2016 -----------------------------------------    I have reviewed the triage vital signs and the nursing notes.   HISTORY  Chief Complaint Abdominal Cramping and Possible Pregnancy    HPI April Tran is a 25 y.o. female who presents the ER reporting she is pregnant and does not know how far along she is. She's been having abdominal cramping since yesterday worsens with activity. She denies bleeding reports clear discharge since chest today. She states she is taking warm baths and has not been able to stop the cramping. She denies vomiting but has had nausea. She is G2 P1 AB 0. She never had cramping like this with previous pregnancies.   History reviewed. No pertinent past medical history.  There are no active problems to display for this patient.   Past Surgical History:  Procedure Laterality Date  . TONSILLECTOMY      Allergies Review of patient's allergies indicates no known allergies.  Social History Social History  Substance Use Topics  . Smoking status: Never Smoker  . Smokeless tobacco: Never Used  . Alcohol use No    Review of Systems Constitutional: Negative for fever. Cardiovascular: Negative for chest pain. Respiratory: Negative for shortness of breath. Gastrointestinal: Positive for abdominal pain, vomiting and diarrhea Genitourinary: Negative for dysuria. Musculoskeletal: Negative for back pain. Skin: Negative for rash. Neurological: Negative for headaches, focal weakness or numbness.  10-point ROS otherwise negative.  ____________________________________________   PHYSICAL EXAM:  VITAL SIGNS: ED Triage Vitals  Enc Vitals Group     BP 04/17/16 1943 130/73     Pulse Rate 04/17/16 1943 (!) 104     Resp 04/17/16 1943 16     Temp 04/17/16 1943 97.4 F (36.3 C)     Temp Source 04/17/16  1943 Oral     SpO2 04/17/16 1943 95 %     Weight 04/17/16 1943 207 lb (93.9 kg)     Height 04/17/16 1943 5\' 6"  (1.676 m)     Head Circumference --      Peak Flow --      Pain Score 04/17/16 1941 6     Pain Loc --      Pain Edu? --      Excl. in GC? --     Constitutional: Alert and oriented. Well appearing and in no distress. Eyes: Conjunctivae are normal. PERRL. Normal extraocular movements. ENT   Head: Normocephalic and atraumatic.   Nose: No congestion/rhinnorhea.   Mouth/Throat: Mucous membranes are moist.   Neck: No stridor. Cardiovascular: Normal rate, regular rhythm. No murmurs, rubs, or gallops. Respiratory: Normal respiratory effort without tachypnea nor retractions. Breath sounds are clear and equal bilaterally. No wheezes/rales/rhonchi. Gastrointestinal: Soft and nontender. Normal bowel sounds Genitourinary: White vaginal discharge, cervical tenderness Musculoskeletal: Nontender with normal range of motion in all extremities. No lower extremity tenderness nor edema. Neurologic:  Normal speech and language. No gross focal neurologic deficits are appreciated.  Skin:  Skin is warm, dry and intact. No rash noted. Psychiatric: Mood and affect are normal. Speech and behavior are normal.  ____________________________________________  ED COURSE:  Pertinent labs & imaging results that were available during my care of the patient were reviewed by me and considered in my medical decision making (see chart for details). Clinical Course  Patient is in no acute distress, we will check basic labs, consider ultrasound.  Procedures ____________________________________________   LABS (pertinent  positives/negatives)  Labs Reviewed  WET PREP, GENITAL - Abnormal; Notable for the following:       Result Value   WBC, Wet Prep HPF POC FEW (*)    All other components within normal limits  HCG, QUANTITATIVE, PREGNANCY - Abnormal; Notable for the following:    hCG, Beta  Chain, Quant, S 5,945 (*)    All other components within normal limits  COMPREHENSIVE METABOLIC PANEL - Abnormal; Notable for the following:    Potassium 3.4 (*)    Glucose, Bld 105 (*)    All other components within normal limits  POC URINE PREG, ED - Abnormal; Notable for the following:    Preg Test, Ur Positive (*)    All other components within normal limits  CHLAMYDIA/NGC RT PCR ([facility] ONLY)  CBC WITH DIFFERENTIAL/PLATELET    RADIOLOGY  Pregnancy ultrasound Is pending ____________________________________________  FINAL ASSESSMENT AND PLAN  Threatened miscarriage  Plan: Patient with labs and imaging as dictated above. Ultrasound is pending, final disposition and plan is checked out to Dr. Cherylann Banas, Cecille Amsterdam, MD   Note: This dictation was prepared with Dragon dictation. Any transcriptional errors that result from this process are unintentional    Emily Filbert, MD 04/17/16 2344

## 2016-04-18 NOTE — ED Provider Notes (Signed)
-----------------------------------------   1:15 AM on 04/18/2016 -----------------------------------------   Blood pressure (!) 144/80, pulse 89, temperature 97.4 F (36.3 C), temperature source Oral, resp. rate 16, height 5\' 6"  (1.676 m), weight 207 lb (93.9 kg), last menstrual period 03/13/2016, SpO2 99 %.  Assuming care from Dr. Mayford KnifeWilliams.  In short, April Tran is a 25 y.o. female with a chief complaint of Abdominal Cramping and Possible Pregnancy .  Refer to the original H&P for additional details.  The current plan of care is to follow up the results of the ultrasound.   Pelvic US: Probable early intrauterine gestational and yolk sac, without fetal pole, or cardiac activity yet visualized. Recommend follow-up quantitative B-HCG levels and follow-up US in 14 days to confirm and assess viability.  Small to moderate subchorionic hemorrhage.  The patient will be discharged home [facility].   Rebecka ApleyAllison P Webster, MD 04/18/16 [phone]

## 2016-04-18 NOTE — ED Notes (Signed)
E signature pad not working. Pt verbalized consent.

## 2016-05-30 LAB — OB RESULTS CONSOLE VARICELLA ZOSTER ANTIBODY, IGG: Varicella: IMMUNE

## 2016-05-30 LAB — OB RESULTS CONSOLE RUBELLA ANTIBODY, IGM: RUBELLA: IMMUNE

## 2016-06-02 ENCOUNTER — Emergency Department: Payer: Medicaid Other

## 2016-06-02 ENCOUNTER — Encounter: Payer: Self-pay | Admitting: *Deleted

## 2016-06-02 ENCOUNTER — Emergency Department
Admission: EM | Admit: 2016-06-02 | Discharge: 2016-06-02 | Disposition: A | Discharge status: Home / Self Care | Payer: Medicaid Other | Attending: Emergency Medicine | Admitting: Emergency Medicine

## 2016-06-02 DIAGNOSIS — Z3A12 12 weeks gestation of pregnancy: Secondary | ICD-10-CM | POA: Diagnosis not present

## 2016-06-02 DIAGNOSIS — O26891 Other specified pregnancy related conditions, first trimester: Secondary | ICD-10-CM | POA: Insufficient documentation

## 2016-06-02 DIAGNOSIS — R1032 Left lower quadrant pain: Secondary | ICD-10-CM | POA: Diagnosis not present

## 2016-06-02 DIAGNOSIS — R109 Unspecified abdominal pain: Secondary | ICD-10-CM

## 2016-06-02 LAB — URINALYSIS COMPLETE WITH MICROSCOPIC (ARMC ONLY)
Bilirubin Urine: NEGATIVE
Glucose, UA: NEGATIVE mg/dL
Hgb urine dipstick: NEGATIVE
Ketones, ur: NEGATIVE mg/dL
Nitrite: NEGATIVE
PH: 6 (ref 5.0–8.0)
PROTEIN: NEGATIVE mg/dL
Specific Gravity, Urine: 1.008 (ref 1.005–1.030)

## 2016-06-02 LAB — COMPREHENSIVE METABOLIC PANEL
ALBUMIN: 3.6 g/dL (ref 3.5–5.0)
ALT: 11 U/L — AB (ref 14–54)
AST: 18 U/L (ref 15–41)
Alkaline Phosphatase: 59 U/L (ref 38–126)
Anion gap: 5 (ref 5–15)
BUN: 10 mg/dL (ref 6–20)
CHLORIDE: 104 mmol/L (ref 101–111)
CO2: 27 mmol/L (ref 22–32)
CREATININE: 0.69 mg/dL (ref 0.44–1.00)
Calcium: 9.3 mg/dL (ref 8.9–10.3)
GFR calc Af Amer: >60 mL/min (ref >60)
GLUCOSE: 67 mg/dL (ref 65–99)
POTASSIUM: 3.5 mmol/L (ref 3.5–5.1)
Sodium: 136 mmol/L (ref 135–145)
Total Bilirubin: 0.5 mg/dL (ref 0.3–1.2)
Total Protein: 7.6 g/dL (ref 6.5–8.1)

## 2016-06-02 LAB — WET PREP, GENITAL
CLUE CELLS WET PREP: NONE SEEN
Sperm: NONE SEEN
TRICH WET PREP: NONE SEEN
YEAST WET PREP: NONE SEEN

## 2016-06-02 LAB — CBC WITH DIFFERENTIAL/PLATELET
BASOS ABS: 0 10*3/uL (ref 0–0.1)
BASOS PCT: 0 %
EOS PCT: 2 %
Eosinophils Absolute: 0.1 10*3/uL (ref 0–0.7)
HCT: 37.8 % (ref 35.0–47.0)
Hemoglobin: 13.3 g/dL (ref 12.0–16.0)
Lymphocytes Relative: 23 %
Lymphs Abs: 1.9 10*3/uL (ref 1.0–3.6)
MCH: 29.4 pg (ref 26.0–34.0)
MCHC: 35.1 g/dL (ref 32.0–36.0)
MCV: 83.7 fL (ref 80.0–100.0)
Monocytes Absolute: 0.6 10*3/uL (ref 0.2–0.9)
Monocytes Relative: 7 %
NEUTROS ABS: 5.7 10*3/uL (ref 1.4–6.5)
NEUTROS PCT: 68 %
PLATELETS: 138 10*3/uL — AB (ref 150–440)
RBC: 4.51 MIL/uL (ref 3.80–5.20)
RDW: 14.4 % (ref 11.5–14.5)
WBC: 8.4 10*3/uL (ref 3.6–11.0)

## 2016-06-02 LAB — CHLAMYDIA/NGC RT PCR (ARMC ONLY)
CHLAMYDIA TR: NOT DETECTED
N GONORRHOEAE: NOT DETECTED

## 2016-06-02 LAB — HCG, QUANTITATIVE, PREGNANCY: hCG, Beta Chain, Quant, S: 44574 m[IU]/mL — ABNORMAL HIGH (ref <5)

## 2016-06-02 LAB — ABO/RH: ABO/RH(D): A POS

## 2016-06-02 LAB — LIPASE, BLOOD: LIPASE: 23 U/L (ref 11–51)

## 2016-06-02 MED ORDER — OXYCODONE-ACETAMINOPHEN 5-325 MG PO TABS: 1.0000 | ORAL_TABLET | Freq: Four times a day (QID) | ORAL | 0 refills | Status: DC | PRN

## 2016-06-02 MED ORDER — AZITHROMYCIN 500 MG PO TABS
1000.0000 mg | ORAL_TABLET | Freq: Once | ORAL | Status: AC
  Administered 2016-06-02: 1000 mg via ORAL
  Filled 2016-06-02: qty 2

## 2016-06-02 MED ORDER — DEXTROSE 5 % IV SOLN
1.0000 g | Freq: Once | INTRAVENOUS | Status: AC
  Administered 2016-06-02: 1 g via INTRAVENOUS
  Filled 2016-06-02: qty 10

## 2016-06-02 MED ORDER — MORPHINE SULFATE (PF) 4 MG/ML IV SOLN: 4.0000 mg | Freq: Once | INTRAVENOUS | Status: DC

## 2016-06-02 MED ORDER — SODIUM CHLORIDE 0.9 % IV BOLUS (SEPSIS)
1000.0000 mL | Freq: Once | INTRAVENOUS | Status: AC
  Administered 2016-06-02: 1000 mL via INTRAVENOUS

## 2016-06-02 MED ORDER — FENTANYL CITRATE (PF) 100 MCG/2ML IJ SOLN
50.0000 ug | Freq: Once | INTRAMUSCULAR | Status: AC
  Administered 2016-06-02: 50 ug via INTRAVENOUS
  Filled 2016-06-02: qty 2

## 2016-06-02 NOTE — ED Triage Notes (Signed)
Pt to ED reporting left sided flank pain that began 2 days ago that became worse last night. PT reports she has been unable to urinate since yesterday. Pt unable to get comfortable in triage, but in mo other acute distress.

## 2016-06-02 NOTE — ED Notes (Signed)
Patient is in ultrasound at this time.

## 2016-06-02 NOTE — ED Provider Notes (Addendum)
[facility] Emergency Department Provider Note  ____________________________________________   I have reviewed the triage vital signs and the nursing notes.   HISTORY  Chief Complaint Flank Pain    HPI Equilla L [place] is a 25 y.o. female G2 P1 who she is a partially 11 weeks by prior ultrasound cannot remember when her last menstrual period was. Patient states she has had left flank pain that radiates towards her groin and a little bit down her leg for the last 3-4 days. She has had no fever or vomiting. She states that she has had limited amounts of urine output without any frank dysuria that she can recall. Patient is a very vague historian. She denies any vaginal bleeding, she does have a vaginal discharge. She is with 1 sexual partner of her husband of the ears no history of STI. Sharp, gradual in onset.      History reviewed. No pertinent past medical history.  There are no active problems to display for this patient.   Past Surgical History:  Procedure Laterality Date  . TONSILLECTOMY      Prior to Admission medications   Not on File    Allergies Morphine and related  History reviewed. No pertinent family history.  Social History Social History  Substance Use Topics  . Smoking status: Never Smoker  . Smokeless tobacco: Never Used  . Alcohol use No    Review of Systems Constitutional: No fever/chills Eyes: No visual changes. ENT: No sore throat. No stiff neck no neck pain Cardiovascular: Denies chest pain. Respiratory: Denies shortness of breath. Gastrointestinal:   no vomiting.  No diarrhea.  No constipation. Genitourinary: See history of present illness Musculoskeletal: Negative lower extremity swelling Skin: Negative for rash. Neurological: Negative for severe headaches, focal weakness or numbness. 10-point ROS otherwise negative.  ____________________________________________   PHYSICAL EXAM:  VITAL SIGNS: ED Triage  Vitals  Enc Vitals Group     BP 06/02/16 1227 121/68     Pulse Rate 06/02/16 1227 97     Resp 06/02/16 1227 20     Temp 06/02/16 1227 98 F (36.7 C)     Temp Source 06/02/16 1227 Oral     SpO2 06/02/16 1227 100 %     Weight 06/02/16 1228 207 lb (93.9 kg)     Height 06/02/16 1228 5\' 6"  (1.676 m)     Head Circumference --      Peak Flow --      Pain Score 06/02/16 1235 5     Pain Loc --      Pain Edu? --      Excl. in GC? --     Constitutional: Alert and oriented. Well appearing and in no acute distress. Eyes: Conjunctivae are normal. PERRL. EOMI. Head: Atraumatic. Nose: No congestion/rhinnorhea. Mouth/Throat: Mucous membranes are moist.  Oropharynx non-erythematous. Neck: No stridor.   Nontender with no meningismus Cardiovascular: Normal rate, regular rhythm. Grossly normal heart sounds.  Good peripheral circulation. Respiratory: Normal respiratory effort.  No retractions. Lungs CTAB. Abdominal: With a obese, there is left lower quadrant tenderness noted No distention. No guarding no rebound Back:  There is no focal tenderness or step off.  there is no midline tenderness there are no lesions noted. there is Positive left CVA tenderness Pelvic exam: Female nurse chaperone present, no external lesions noted, positive white/yellowish vaginal discharge noted , some tenderness to palpation of the cervix,, his left adnexal tenderness no mass, there is no significant uterine tenderness or mass. No  vaginal bleeding Musculoskeletal: No lower extremity tenderness, no upper extremity tenderness. No joint effusions, no DVT signs strong distal pulses no edema Neurologic:  Normal speech and language. No gross focal neurologic deficits are appreciated.  Skin:  Skin is warm, dry and intact. No rash noted. Psychiatric: Mood and affect are normal. Speech and behavior are normal.  ____________________________________________   LABS (all labs ordered are listed, but only abnormal results are  displayed)  Labs Reviewed  URINALYSIS COMPLETEWITH MICROSCOPIC ([facility] ONLY) - Abnormal; Notable for the following:       Result Value   Color, Urine YELLOW (*)    APPearance CLOUDY (*)    Leukocytes, UA 1+ (*)    Bacteria, UA RARE (*)    Squamous Epithelial / LPF 6-30 (*)    All other components within normal limits  CBC WITH DIFFERENTIAL/PLATELET - Abnormal; Notable for the following:    Platelets 138 (*)    All other components within normal limits  COMPREHENSIVE METABOLIC PANEL - Abnormal; Notable for the following:    ALT 11 (*)    All other components within normal limits  URINE CULTURE  CHLAMYDIA/NGC RT PCR ([facility] ONLY)  WET PREP, GENITAL  LIPASE, BLOOD  HCG, QUANTITATIVE, PREGNANCY  POC URINE PREG, ED  ABO/RH   ____________________________________________  EKG  I personally interpreted any EKGs ordered by me or triage  ____________________________________________  RADIOLOGY  I reviewed any imaging ordered by me or triage that were performed during my shift and, if possible, patient and/or family made aware of any abnormal findings. ____________________________________________   PROCEDURES  Procedure(s) performed: None  Procedures  Critical Care performed: None  ____________________________________________   INITIAL IMPRESSION / ASSESSMENT AND PLAN / ED COURSE  Pertinent labs & imaging results that were available during my care of the patient were reviewed by me and considered in my medical decision making (see chart for details).  Patient with early pregnancy presents with left sided flank pain, or dairy hesitancy, and left pelvic pain. Concern for pyelonephritis exist. Ectopic is thought less likely given prior ultrasound however that was quite an early ultrasound. Patient vaginal discomfort and tenderness to palpation on bimanual exam as well. Differential includes ectopic urgency which again is not favored, as well as pyelonephritis and PID  although she is nearly to the point in her pregnancy or PID is less likely to be present. We will obtain ultrasound of her left kidney to rule out hydronephrosis from possible stone although she has no history of stone, we will obtain ultrasound of her uterus and adnexa as morbid obesity somewhat limits exam, and we will check basic blood work area patient states she cannot tolerate morphine because she had it once and her blood pressure was elevated. Unclear what this means does not sound like an anaphylactic reaction and she is quite uncomfortable she states , accordingly we will give her fentanyl which hopefully should be more tolerable for her.  ----------------------------------------- 4:03 PM on 06/02/2016 -----------------------------------------  Very reassuring workup and fact. Patient does have some vaginal discharge but there is no other acute evidence of PID ultrasound shows no tubo-ovarian abscess white count is normal there is no evidence of pyelonephritis nothing to suggest kidney stone this is on the left side there is nothing at this time to suggest the patient has appendicitis no fever no white count etc. She is not vomiting. This is a very unusual presentation, I did discuss with Dr. Valentino Saxon, who is kind enough to take the consult for  OB/GYN. They think that empiric treatment with Rocephin disimpaction see her in close follow-up in the office would be appropriate for this patient which we will do. Extensive return precautions given and follow-up understood. Patient will also be giving a prescription for pain medication. She understands if she gets a fever or increased pain etc. she is to return. We'll send a urine culture although there is no evidence of urinary tract infection at this time, as that would change management.  ----------------------------------------- 4:32 PM on 06/02/2016 -----------------------------------------  Patient eating a sandwich in no acute distress pain is  much relieved serial abdominal exams are benign at this time no evidence of appendicitis or other intra-abdominal pathology of any significance white count reassuring no vomiting no fever extensive return precautions given and understood no evidence of UTI but we will send culture is a precaution, patient understands she must back if she feels worse in any way. Low suspicion for PID given exam and history an ultrasound however, we will treat her empirically as discussed with OB/GYN. They do not wish me to send her home on antibiotics. Patient understands she must back if she feels worse. OB will see her in the next few days.  Clinical Course   ____________________________________________   FINAL CLINICAL IMPRESSION(S) / ED DIAGNOSES  Final diagnoses:  None      This chart was dictated using voice recognition software.  Despite best efforts to proofread,  errors can occur which can change meaning.      Jeanmarie PlantJames A Lattie Riege, MD 06/02/16 1445    Jeanmarie PlantJames A Rocko Fesperman, MD 06/02/16 1605    Jeanmarie PlantJames A Shardea Cwynar, MD 06/02/16 [phone]

## 2016-06-04 LAB — URINE CULTURE

## 2016-06-05 NOTE — Progress Notes (Signed)
ED Discharge Culture review:  25 yo F with ER visit 06/02/16 flank pain. Patient is Pregnant (~ 11 wks per MD note). Patient received Ceftriaxone 1 gram IMand Azithromycin 1000mg  x 1 in ER.  Allergies: morphine  06/02/16: Urine cx:  + Staph Species Coag .negative- pan Sensitive  Called Patient at [phone] for Pharmacy Preference. [facility] [address] [phone].  RX for Cefuroxime 250mg  po BID x 7 days called in to above Pharmacy per Dr. Lorie ApleyP Malinda  Bari MantisKristin Aalijah Lanphere PharmD Clinical Pharmacist 06/05/2016  Results for orders placed or performed during the hospital encounter of 06/02/16  Urine culture     Status: Abnormal   Collection Time: 06/02/16  1:40 PM  Result Value Ref Range Status   Specimen Description URINE, RANDOM  Final   Special Requests NONE  Final   Culture (A)  Final    >=100,000 COLONIES/mL STAPHYLOCOCCUS SPECIES (COAGULASE NEGATIVE)   Report Status 06/04/2016 FINAL  Final   Organism ID, Bacteria STAPHYLOCOCCUS SPECIES (COAGULASE NEGATIVE) (A)  Final      Susceptibility   Staphylococcus species (coagulase negative) - MIC*    CIPROFLOXACIN <=0.5 SENSITIVE Sensitive     GENTAMICIN <=0.5 SENSITIVE Sensitive     NITROFURANTOIN <=16 SENSITIVE Sensitive     OXACILLIN Value in next row Sensitive      <=0.25 SENSITIVEWARNING: For oxacillin-resistant S.aureus and coagulase-negative staphylococci (MRS), other beta-lactam agents, ie, penicillins, beta-lactam/beta-lactamase inhibitor combinations, cephems (with the exception of the cephalosporins with anti-MRSA activity), and carbapenems, may appear active in vitro, but are not effective clinically.  --CLSI, Vol.32 No.3, January 2012, pg 70.    TETRACYCLINE Value in next row Sensitive      <=0.25 SENSITIVEWARNING: For oxacillin-resistant S.aureus and coagulase-negative staphylococci (MRS), other beta-lactam agents, ie, penicillins, beta-lactam/beta-lactamase inhibitor combinations, cephems (with  the exception of the cephalosporins with anti-MRSA activity), and carbapenems, may appear active in vitro, but are not effective clinically.  --CLSI, Vol.32 No.3, January 2012, pg 70.    VANCOMYCIN Value in next row Sensitive      <=0.25 SENSITIVEWARNING: For oxacillin-resistant S.aureus and coagulase-negative staphylococci (MRS), other beta-lactam agents, ie, penicillins, beta-lactam/beta-lactamase inhibitor combinations, cephems (with the exception of the cephalosporins with anti-MRSA activity), and carbapenems, may appear active in vitro, but are not effective clinically.  --CLSI, Vol.32 No.3, January 2012, pg 70.    TRIMETH/SULFA Value in next row Sensitive      <=0.25 SENSITIVEWARNING: For oxacillin-resistant S.aureus and coagulase-negative staphylococci (MRS), other beta-lactam agents, ie, penicillins, beta-lactam/beta-lactamase inhibitor combinations, cephems (with the exception of the cephalosporins with anti-MRSA activity), and carbapenems, may appear active in vitro, but are not effective clinically.  --CLSI, Vol.32 No.3, January 2012, pg 70.    CLINDAMYCIN Value in next row Sensitive      <=0.25 SENSITIVEWARNING: For oxacillin-resistant S.aureus and coagulase-negative staphylococci (MRS), other beta-lactam agents, ie, penicillins, beta-lactam/beta-lactamase inhibitor combinations, cephems (with the exception of the cephalosporins with anti-MRSA activity), and carbapenems, may appear active in vitro, but are not effective clinically.  --CLSI, Vol.32 No.3, January 2012, pg 70.    RIFAMPIN Value in next row Sensitive      <=0.25 SENSITIVEWARNING: For oxacillin-resistant S.aureus and coagulase-negative staphylococci (MRS), other beta-lactam agents, ie, penicillins, beta-lactam/beta-lactamase inhibitor combinations, cephems (with the exception of the cephalosporins with anti-MRSA activity), and carbapenems, may appear active in vitro, but are not effective clinically.  --CLSI, Vol.32 No.3, January  2012, pg 70.    * >=100,000 COLONIES/mL STAPHYLOCOCCUS SPECIES (COAGULASE NEGATIVE)  Chlamydia/NGC rt  PCR     Status: None   Collection Time: 06/02/16  2:29 PM  Result Value Ref Range Status   Specimen source GC/Chlam ENDOCERVICAL  Final   Chlamydia Tr NOT DETECTED NOT DETECTED Final   N gonorrhoeae NOT DETECTED NOT DETECTED Final    Comment: (NOTE) 100  This methodology has not been evaluated in pregnant women or in 200  patients with a history of hysterectomy. 300 400  This methodology will not be performed on patients less than 3  years of age.   Wet prep, genital     Status: Abnormal   Collection Time: 06/02/16  2:29 PM  Result Value Ref Range Status   Yeast Wet Prep HPF POC NONE SEEN NONE SEEN Final   Trich, Wet Prep NONE SEEN NONE SEEN Final   Clue Cells Wet Prep HPF POC NONE SEEN NONE SEEN Final   WBC, Wet Prep HPF POC FEW (A) NONE SEEN Final   Sperm NONE SEEN  Final

## 2016-06-09 LAB — OB RESULTS CONSOLE HEPATITIS B SURFACE ANTIGEN: HEP B S AG: NEGATIVE

## 2016-06-09 LAB — OB RESULTS CONSOLE HIV ANTIBODY (ROUTINE TESTING): HIV: NONREACTIVE

## 2016-06-29 ENCOUNTER — Encounter: Payer: Self-pay | Admitting: Emergency Medicine

## 2016-06-29 ENCOUNTER — Emergency Department
Admission: EM | Admit: 2016-06-29 | Discharge: 2016-06-29 | Disposition: A | Discharge status: Home / Self Care | Payer: Medicaid Other | Attending: Emergency Medicine | Admitting: Emergency Medicine

## 2016-06-29 DIAGNOSIS — Z3A16 16 weeks gestation of pregnancy: Secondary | ICD-10-CM | POA: Diagnosis not present

## 2016-06-29 DIAGNOSIS — O2341 Unspecified infection of urinary tract in pregnancy, first trimester: Secondary | ICD-10-CM | POA: Diagnosis not present

## 2016-06-29 DIAGNOSIS — N390 Urinary tract infection, site not specified: Secondary | ICD-10-CM

## 2016-06-29 DIAGNOSIS — Z79899 Other long term (current) drug therapy: Secondary | ICD-10-CM | POA: Diagnosis not present

## 2016-06-29 DIAGNOSIS — R109 Unspecified abdominal pain: Secondary | ICD-10-CM

## 2016-06-29 DIAGNOSIS — O26899 Other specified pregnancy related conditions, unspecified trimester: Secondary | ICD-10-CM

## 2016-06-29 DIAGNOSIS — O26891 Other specified pregnancy related conditions, first trimester: Secondary | ICD-10-CM | POA: Diagnosis present

## 2016-06-29 LAB — WET PREP, GENITAL
Clue Cells Wet Prep HPF POC: NONE SEEN
Sperm: NONE SEEN
Trich, Wet Prep: NONE SEEN
YEAST WET PREP: NONE SEEN

## 2016-06-29 LAB — URINALYSIS COMPLETE WITH MICROSCOPIC (ARMC ONLY)
Bilirubin Urine: NEGATIVE
Glucose, UA: NEGATIVE mg/dL
HGB URINE DIPSTICK: NEGATIVE
NITRITE: NEGATIVE
PH: 5 (ref 5.0–8.0)
PROTEIN: NEGATIVE mg/dL
SPECIFIC GRAVITY, URINE: 1.016 (ref 1.005–1.030)

## 2016-06-29 LAB — COMPREHENSIVE METABOLIC PANEL
ALBUMIN: 3.4 g/dL — AB (ref 3.5–5.0)
ALT: 12 U/L — ABNORMAL LOW (ref 14–54)
ANION GAP: 7 (ref 5–15)
AST: 17 U/L (ref 15–41)
Alkaline Phosphatase: 55 U/L (ref 38–126)
BILIRUBIN TOTAL: 0.8 mg/dL (ref 0.3–1.2)
BUN: 8 mg/dL (ref 6–20)
CO2: 25 mmol/L (ref 22–32)
Calcium: 9.2 mg/dL (ref 8.9–10.3)
Chloride: 104 mmol/L (ref 101–111)
Creatinine, Ser: 0.74 mg/dL (ref 0.44–1.00)
GFR calc Af Amer: >60 mL/min (ref >60)
GFR calc non Af Amer: >60 mL/min (ref >60)
GLUCOSE: 87 mg/dL (ref 65–99)
POTASSIUM: 3.5 mmol/L (ref 3.5–5.1)
SODIUM: 136 mmol/L (ref 135–145)
TOTAL PROTEIN: 7.3 g/dL (ref 6.5–8.1)

## 2016-06-29 LAB — LIPASE, BLOOD: Lipase: 25 U/L (ref 11–51)

## 2016-06-29 LAB — CBC
HEMATOCRIT: 40.2 % (ref 35.0–47.0)
HEMOGLOBIN: 13.7 g/dL (ref 12.0–16.0)
MCH: 29.4 pg (ref 26.0–34.0)
MCHC: 34.1 g/dL (ref 32.0–36.0)
MCV: 86.4 fL (ref 80.0–100.0)
Platelets: 136 10*3/uL — ABNORMAL LOW (ref 150–440)
RBC: 4.65 MIL/uL (ref 3.80–5.20)
RDW: 14.3 % (ref 11.5–14.5)
WBC: 9 10*3/uL (ref 3.6–11.0)

## 2016-06-29 LAB — CHLAMYDIA/NGC RT PCR (ARMC ONLY)
Chlamydia Tr: NOT DETECTED
N GONORRHOEAE: NOT DETECTED

## 2016-06-29 LAB — RAPID HIV SCREEN (HIV 1/2 AB+AG)
HIV 1/2 ANTIBODIES: NONREACTIVE
HIV-1 P24 ANTIGEN - HIV24: NONREACTIVE

## 2016-06-29 MED ORDER — NITROFURANTOIN MONOHYD MACRO 100 MG PO CAPS: 100.0000 mg | ORAL_CAPSULE | Freq: Two times a day (BID) | ORAL | 0 refills | Status: AC

## 2016-06-29 NOTE — ED Provider Notes (Signed)
[facility] Emergency Department Provider Note    ____________________________________________   I have reviewed the triage vital signs and the nursing notes.   HISTORY  Chief Complaint Abdominal Pain   History limited by: Not Limited   HPI April Tran is a 25 y.o. female at roughly [redacted] weeks pregnant who presents to the emergency department today because of concern for abdominal pain. Patient states that it has been going on for roughly one week. It is located in the suprapubic and left lower quadrant. It will come and go. The patient has not identified any factors that make the pain better or worse. She has not noticed any change in urination or defecation. States she has had a small amount of vaginal discharge, however states it is without odor and normal for her. She says she talked to her ob/gyn doctor today who advised her to present to the emergency department.   History reviewed. No pertinent past medical history.  There are no active problems to display for this patient.   Past Surgical History:  Procedure Laterality Date  . TONSILLECTOMY      Prior to Admission medications   Medication Sig Start Date End Date Taking? Authorizing Provider  oxyCODONE-acetaminophen (ROXICET) 5-325 MG tablet Take 1 tablet by mouth every 6 (six) hours as needed. 06/02/16   Jeanmarie Plant, MD    Allergies Morphine and related  History reviewed. No pertinent family history.  Social History Social History  Substance Use Topics  . Smoking status: Never Smoker  . Smokeless tobacco: Never Used  . Alcohol use No    Review of Systems  Constitutional: Negative for fever. Cardiovascular: Negative for chest pain. Respiratory: Negative for shortness of breath. Gastrointestinal: Positive for suprapubic and left lower quadrant pain. Genitourinary: Negative for dysuria. Musculoskeletal: Negative for back pain. Skin: Negative for rash. Neurological:  Negative for headaches, focal weakness or numbness.  10-point ROS otherwise negative.  ____________________________________________   PHYSICAL EXAM:  VITAL SIGNS: ED Triage Vitals [06/29/16 1431]  Enc Vitals Group     BP 120/67     Pulse Rate 95     Resp 18     Temp 97.6 F (36.4 C)     Temp Source Oral     SpO2 100 %     Weight 222 lb (100.7 kg)     Height 5\' 6"  (1.676 m)     Head Circumference      Peak Flow      Pain Score 8   Constitutional: Alert and oriented. Well appearing and in no distress. Eyes: Conjunctivae are normal. Normal extraocular movements. ENT   Head: Normocephalic and atraumatic.   Nose: No congestion/rhinnorhea.   Mouth/Throat: Mucous membranes are moist.   Neck: No stridor. Hematological/Lymphatic/Immunilogical: No cervical lymphadenopathy. Cardiovascular: Normal rate, regular rhythm.  No murmurs, rubs, or gallops.  Respiratory: Normal respiratory effort without tachypnea nor retractions. Breath sounds are clear and equal bilaterally. No wheezes/rales/rhonchi. Gastrointestinal: Soft and minimally tender in the suprapubic region. Genitourinary: Small amount of vaginal discharge. No CMT. No adnexal fullness or tenderness.  Musculoskeletal: Normal range of motion in all extremities. No lower extremity edema. Neurologic:  Normal speech and language. No gross focal neurologic deficits are appreciated.  Skin:  Skin is warm, dry and intact. No rash noted. Psychiatric: Mood and affect are normal. Speech and behavior are normal. Patient exhibits appropriate insight and judgment.  ____________________________________________    LABS (pertinent positives/negatives)  Labs Reviewed  WET PREP, GENITAL -  Abnormal; Notable for the following:       Result Value   WBC, Wet Prep HPF POC FEW (*)    All other components within normal limits  COMPREHENSIVE METABOLIC PANEL - Abnormal; Notable for the following:    Albumin 3.4 (*)    ALT 12 (*)    All  other components within normal limits  CBC - Abnormal; Notable for the following:    Platelets 136 (*)    All other components within normal limits  URINALYSIS COMPLETEWITH MICROSCOPIC ([facility] ONLY) - Abnormal; Notable for the following:    Color, Urine YELLOW (*)    APPearance HAZY (*)    Ketones, ur 1+ (*)    Leukocytes, UA TRACE (*)    Bacteria, UA RARE (*)    Squamous Epithelial / LPF 6-30 (*)    All other components within normal limits  CHLAMYDIA/NGC RT PCR ([facility] ONLY)  LIPASE, BLOOD  RAPID HIV SCREEN (HIV 1/2 AB+AG)     ____________________________________________   EKG  None  ____________________________________________    RADIOLOGY  None  ____________________________________________   PROCEDURES  Procedures  ____________________________________________   INITIAL IMPRESSION / ASSESSMENT AND PLAN / ED COURSE  Pertinent labs & imaging results that were available during my care of the patient were reviewed by me and considered in my medical decision making (see chart for details).  Patient with abdominal pain in the setting of pregnancy. Work up here with possible UTI. Think this could explain some of the patient's symptoms. Additionally would also expect some discomfort to come from the pregnancy itself. Patient previously with US demonstrating single IUP. Will plan on discharging home with antibiotics for UTI and to follow up with Ob.Gyn. ____________________________________________   FINAL CLINICAL IMPRESSION(S) / ED DIAGNOSES  Final diagnoses:  Urinary tract infection without hematuria, site unspecified  Abdominal pain in pregnancy, unspecified trimester     Note: This dictation was prepared with Dragon dictation. Any transcriptional errors that result from this process are unintentional    Phineas SemenGraydon Floride Hutmacher, MD 06/29/16 2114

## 2016-06-29 NOTE — ED Notes (Signed)
Abdominal cramping and white vaginal discharge. Pt [redacted] weeks pregnant. No bleeding. Pt alert and oriented X4, active, cooperative, pt in NAD. RR even and unlabored, color WNL.

## 2016-06-29 NOTE — Discharge Instructions (Signed)
Please seek medical attention for any high fevers, chest pain, shortness of breath, change in behavior, persistent vomiting, bloody stool or any other new or concerning symptoms.  

## 2016-06-29 NOTE — ED Triage Notes (Signed)
Pt to ed with c/o abd pain and cramping with white vaginal d/c.  Pt is approx [redacted] weeks pregnant.

## 2016-07-26 ENCOUNTER — Observation Stay
Admission: EM | Admit: 2016-07-26 | Discharge: 2016-07-26 | Disposition: A | Discharge status: Home / Self Care | Payer: Medicaid Other | Attending: Obstetrics and Gynecology | Admitting: Obstetrics and Gynecology

## 2016-07-26 DIAGNOSIS — Y92002 Bathroom of unspecified non-institutional (private) residence single-family (private) house as the place of occurrence of the external cause: Secondary | ICD-10-CM | POA: Diagnosis not present

## 2016-07-26 DIAGNOSIS — Z0379 Encounter for other suspected maternal and fetal conditions ruled out: Secondary | ICD-10-CM | POA: Diagnosis present

## 2016-07-26 DIAGNOSIS — Y93E9 Activity, other interior property and clothing maintenance: Secondary | ICD-10-CM | POA: Diagnosis not present

## 2016-07-26 DIAGNOSIS — Y92009 Unspecified place in unspecified non-institutional (private) residence as the place of occurrence of the external cause: Secondary | ICD-10-CM

## 2016-07-26 DIAGNOSIS — W19XXXA Unspecified fall, initial encounter: Secondary | ICD-10-CM | POA: Diagnosis not present

## 2016-07-26 MED ORDER — ACETAMINOPHEN 325 MG PO TABS: 650.0000 mg | ORAL_TABLET | ORAL | Status: DC | PRN

## 2016-07-26 NOTE — OB Triage Provider Note (Signed)
TRIAGE NOTE to rule out Preterm Labor   History of Present Illness: Rita OharaChristian L [place] is a 25 y.o. G2P1001 at 6613w1d presenting to triage for fall on abdomen 24hrs ago  Patient reports the fetal movement as active. Patient reports uterine contraction  activity as none. Patient reports  vaginal bleeding as none. Patient describes fluid per vagina as None.  History reviewed. No pertinent past medical history.  Past Surgical History:  Procedure Laterality Date  . TONSILLECTOMY      OB History  Gravida Para Term Preterm AB Living  2 1       1   SAB TAB Ectopic Multiple Live Births               # Outcome Date GA Lbr Len/2nd Weight Sex Delivery Anes PTL Lv  2 Current           1 Para               Social History   Social History  . Marital status: Married    Spouse name: N/A  . Number of children: N/A  . Years of education: N/A   Social History Main Topics  . Smoking status: Never Smoker  . Smokeless tobacco: Never Used  . Alcohol use No  . Drug use: No  . Sexual activity: Not Asked   Other Topics Concern  . None   Social History Narrative  . None    No family history on file.  Allergies  Allergen Reactions  . Morphine And Related Hypertension    Prescriptions Prior to Admission  Medication Sig Dispense Refill Last Dose  . oxyCODONE-acetaminophen (ROXICET) 5-325 MG tablet Take 1 tablet by mouth every 6 (six) hours as needed. 8 tablet 0     Review of Systems - See HPI for OB specific ROS.   Vitals:  LMP 03/13/2016 (Approximate)  Physical Examination: CONSTITUTIONAL: Well-developed, well-nourished female in no acute distress.  HENT:  Normocephalic, atraumatic EYES: Conjunctivae and EOM are normal. No scleral icterus.  NECK: Normal range of motion, supple, SKIN: Skin is warm and dry. No rash noted. Not diaphoretic. No erythema. No pallor. NEUROLGIC: Alert and oriented to person, place, and time. No gross cranial nerve deficit noted. PSYCHIATRIC: Normal  mood and affect. Normal behavior. Normal judgment and thought content. ABDOMEN: Soft, nontender, nondistended, gravid.  Cervix: deferred Membranes:intact Fetal Monitoring:Baseline: 150 bpm Tocometer: Flat  Assessment and Plan: Shaneece L [place] is a 25 y.o. G2P1001 at 6613w1d presenting to triage for fall on abdomen 24hrs ago  1. No evidence of fetal compromise, PPROM, vaginal bleeding.   2. Rh positive 3. Thrombocytopenia- plts 136. 4. D/c home with precautions and f/u as planned Cline CoolsBethany E Ranjit Ashurst, MD, MPH

## 2016-07-26 NOTE — Plan of Care (Signed)
Pt seen by dr Dalbert Garnetbeasley. Reassurance given.  Pt discharged home with d/c instructions

## 2016-07-26 NOTE — OB Triage Note (Signed)
Pt states she fell last night while she was cleaning the bathtub. Fell onto her stomach. Went to work today. Came to l/d after work.

## 2016-07-26 NOTE — Plan of Care (Signed)
Pt states she fell approx 6-7pm last night.  No bleeding or gush of fluid noted. Pt also states she is having right and left groin pain that started after she fell.

## 2016-09-13 NOTE — L&D Delivery Note (Addendum)
Operative Delivery Note At 9:15 PM a viable and healthy female "Zackary" was delivered via Vaginal, Spontaneous Delivery.  Presentation: vertex; Position: Occiput,, Anterior; Station: +3.  Delivery of the head: 12/09/2016  9:15 PM First maneuver: 12/09/2016  9:15 PM, Suprapubic Pressure, McRoberts  Verbal consent: obtained from patient.  APGAR: 6,9 ; weight  4050 g.   Placenta status: intact, manually evacuated, .   Cord: 3vc with the following complications: none.  Cord pH: 7.26  Anesthesia:  None Episiotomy: None Lacerations: 3rd degree - <50% of external anal spincter Suture Repair: 2.0 3.0 vicryl Est. Blood Loss (mL):  600mL  Mom to postpartum.  Baby to Special care Nursery.   26yo [id number] at 39+4wks admitted for iol for gHTN today. Her PreE labs were stable. She was 3cm on admission and progressed on pitocin to 4cm and anterior. AROM for clear fluid. Rapid progression to fully dilated. Pushed over an intact perineum without anesthesia and with min to moderate control. On delivery of the fetal head, shoulder dystocia was dx by turtle sign. Pt consented verbally and timing began. Fetal shoulder was reduced after several McRobert's repositioning, suprapubic pressure and verbal directions to mom. Total time of dystocia approx 60 sec. Cord quickly doubly clamped and cut on perineum and baby passed to awaiting NICU staff. Cord gasses collected.  Placenta delivered with manual extraction. Exam revealed intact cervix, firm tone and 3rd deg lac - 3a. Repaired end to end with 2-0 vicryl. Total length of laceration 5cm.  Total EBL 600ml, and resolved with uterine massage.  Total time of ROM: 1853 to 2115 No maternal fever. GBS negative Terminal meconium Total time of second stage: 2054 to 2115 Total time of third stage: 2115 to 2137   Christeen DouglasBEASLEY, Fynlee Rowlands 12/09/2016, 10:16 PM

## 2016-10-15 ENCOUNTER — Observation Stay
Admission: EM | Admit: 2016-10-15 | Discharge: 2016-10-15 | Disposition: A | Discharge status: Home / Self Care | Payer: Medicaid Other | Attending: Obstetrics and Gynecology | Admitting: Obstetrics and Gynecology

## 2016-10-15 ENCOUNTER — Other Ambulatory Visit: Payer: Self-pay

## 2016-10-15 DIAGNOSIS — Z3A31 31 weeks gestation of pregnancy: Secondary | ICD-10-CM | POA: Diagnosis not present

## 2016-10-15 DIAGNOSIS — R1030 Lower abdominal pain, unspecified: Secondary | ICD-10-CM | POA: Diagnosis present

## 2016-10-15 DIAGNOSIS — E860 Dehydration: Secondary | ICD-10-CM | POA: Insufficient documentation

## 2016-10-15 DIAGNOSIS — O99613 Diseases of the digestive system complicating pregnancy, third trimester: Principal | ICD-10-CM | POA: Insufficient documentation

## 2016-10-15 DIAGNOSIS — A084 Viral intestinal infection, unspecified: Secondary | ICD-10-CM | POA: Diagnosis not present

## 2016-10-15 DIAGNOSIS — O479 False labor, unspecified: Secondary | ICD-10-CM | POA: Diagnosis present

## 2016-10-15 DIAGNOSIS — O26893 Other specified pregnancy related conditions, third trimester: Secondary | ICD-10-CM | POA: Insufficient documentation

## 2016-10-15 MED ORDER — TERBUTALINE SULFATE 1 MG/ML IJ SOLN
0.2500 mg | Freq: Once | INTRAMUSCULAR | Status: AC
  Administered 2016-10-15: 0.25 mg via SUBCUTANEOUS

## 2016-10-15 MED ORDER — DINOPROSTONE 10 MG VA INST: 10.0000 mg | VAGINAL_INSERT | Freq: Once | VAGINAL | 0 refills | Status: AC

## 2016-10-15 MED ORDER — TERBUTALINE SULFATE 1 MG/ML IJ SOLN
INTRAMUSCULAR | Status: AC
  Administered 2016-10-15: 0.25 mg via SUBCUTANEOUS
  Filled 2016-10-15: qty 1

## 2016-10-15 MED ORDER — DEXTROSE 5 % IN LACTATED RINGERS IV BOLUS
1000.0000 mL | Freq: Once | INTRAVENOUS | Status: AC
  Administered 2016-10-15: 1000 mL via INTRAVENOUS

## 2016-10-15 MED ORDER — SODIUM CHLORIDE FLUSH 0.9 % IV SOLN
INTRAVENOUS | Status: AC
  Filled 2016-10-15: qty 10

## 2016-10-15 NOTE — Discharge Instructions (Signed)
Preterm labor precautions reviewed with patient, patient verbalized understanding, no questions voiced.  [facility]

## 2016-10-15 NOTE — Progress Notes (Addendum)
Patient ID: April Tran, female   DOB: 12/02/1990, 26 y.o.   MRN: [id number] April Tran 06/28/1991 G2 P1 7179w5d presents for ctx and lower abd cramping . Poor po intake emesis x2 and diarrhea x2 . No fever. Symptoms for 1 days  noLOF , no vaginal bleeding , O;Temp 97.9 F (36.6 C)   Resp 18   Ht 5\' 6"  (1.676 m)   Wt 236 lb (107 kg)   LMP 03/13/2016 (Approximate)   BMI 38.09 kg/m  ABDsoft nt CX TFT internal os closed by RN  NST reactive  NST  Labs: none A:  Viral GE dehydration , no evidence of PTL  P:liter bolus of D5 LR  1 dose of sq terbutaline  Precautions  To pt  D/c home

## 2016-10-15 NOTE — OB Triage Note (Signed)
Patient presents to L&D with c/o contractions increasing in intensity last night accompanied with vaginal spotting.  States baby is moving well, has had some vomiting and diarrhea with last event occurring around 0200 this am.  Pt states she has a HA, denies any visual disturbances.

## 2016-10-15 NOTE — Progress Notes (Signed)
Notified Dr. Feliberto GottronSchermerhorn of pt arrival to the unit and patient c/o of contractions, vaginal spotting, and vomiting/diarrhea x2 in the last 24 hours.  Orders received to perform vaginal exam

## 2016-10-15 NOTE — Discharge Summary (Addendum)
  April Bouchardhomas [name], MD  Obstetrics    [] Hide copied text [] Hover for attribution information Patient ID: April Tran, female   DOB: 12/21/1990, 26 y.o.   MRN: [id number] April Tran 12/15/1990 G2 P1 275w5d presents for ctx and lower abd cramping . Poor po intake emesis x2 and diarrhea x2 . No fever. Symptoms for 1 days  noLOF , no vaginal bleeding , O;Temp 97.9 F (36.6 C)   Resp 18   Ht 5\' 6"  (1.676 m)   Wt 236 lb (107 kg)   LMP 03/13/2016 (Approximate)   BMI 38.09 kg/m  ABDsoft nt CX TFT internal os closed by RN  NST reactive  NST  Labs: none A:  Viral GE dehydration , no evidence of PTL  P:liter bolus of D5 LR  1 dose of sq terbutaline  Precautions  To pt  D/c home

## 2016-11-07 ENCOUNTER — Observation Stay
Admission: EM | Admit: 2016-11-07 | Discharge: 2016-11-08 | Disposition: A | Discharge status: Home / Self Care | Payer: Medicaid Other | Attending: Obstetrics & Gynecology | Admitting: Obstetrics & Gynecology

## 2016-11-07 DIAGNOSIS — Z79899 Other long term (current) drug therapy: Secondary | ICD-10-CM | POA: Insufficient documentation

## 2016-11-07 DIAGNOSIS — F329 Major depressive disorder, single episode, unspecified: Secondary | ICD-10-CM | POA: Insufficient documentation

## 2016-11-07 DIAGNOSIS — R1030 Lower abdominal pain, unspecified: Secondary | ICD-10-CM | POA: Diagnosis present

## 2016-11-07 DIAGNOSIS — O26899 Other specified pregnancy related conditions, unspecified trimester: Secondary | ICD-10-CM

## 2016-11-07 DIAGNOSIS — O99343 Other mental disorders complicating pregnancy, third trimester: Secondary | ICD-10-CM | POA: Diagnosis not present

## 2016-11-07 DIAGNOSIS — Z3A35 35 weeks gestation of pregnancy: Secondary | ICD-10-CM | POA: Diagnosis not present

## 2016-11-07 DIAGNOSIS — O26893 Other specified pregnancy related conditions, third trimester: Principal | ICD-10-CM | POA: Insufficient documentation

## 2016-11-07 DIAGNOSIS — R102 Pelvic and perineal pain: Secondary | ICD-10-CM

## 2016-11-07 HISTORY — DX: Major depressive disorder, single episode, unspecified: F32.9

## 2016-11-07 HISTORY — DX: Depression, unspecified: F32.A

## 2016-11-07 LAB — URINALYSIS, ROUTINE W REFLEX MICROSCOPIC
BILIRUBIN URINE: NEGATIVE
Bacteria, UA: NONE SEEN
Hgb urine dipstick: NEGATIVE
KETONES UR: NEGATIVE mg/dL
LEUKOCYTES UA: NEGATIVE
Nitrite: NEGATIVE
PH: 6 (ref 5.0–8.0)
Protein, ur: NEGATIVE mg/dL
SPECIFIC GRAVITY, URINE: 1.006 (ref 1.005–1.030)

## 2016-11-07 MED ORDER — LACTATED RINGERS IV SOLN
INTRAVENOUS | Status: DC
  Administered 2016-11-08: 02:00:00 via INTRAVENOUS

## 2016-11-07 NOTE — OB Triage Note (Signed)
Patient presented to L&D complaining of severe lower abdominal pain that 2000 after she had dinner. Denies any leaking of fluid, vaginal bleeding or decreased fetal movement.

## 2016-11-08 ENCOUNTER — Observation Stay: Payer: Medicaid Other

## 2016-11-08 DIAGNOSIS — O26893 Other specified pregnancy related conditions, third trimester: Secondary | ICD-10-CM | POA: Diagnosis not present

## 2016-11-08 LAB — URINE DRUG SCREEN, QUALITATIVE (ARMC ONLY)
Amphetamines, Ur Screen: NOT DETECTED
BARBITURATES, UR SCREEN: NOT DETECTED
Benzodiazepine, Ur Scrn: NOT DETECTED
COCAINE METABOLITE, UR ~~LOC~~: NOT DETECTED
Cannabinoid 50 Ng, Ur ~~LOC~~: NOT DETECTED
MDMA (Ecstasy)Ur Screen: NOT DETECTED
METHADONE SCREEN, URINE: NOT DETECTED
OPIATE, UR SCREEN: NOT DETECTED
Phencyclidine (PCP) Ur S: NOT DETECTED
Tricyclic, Ur Screen: NOT DETECTED

## 2016-11-08 LAB — COMPREHENSIVE METABOLIC PANEL
ALBUMIN: 2.9 g/dL — AB (ref 3.5–5.0)
ALT: 12 U/L — ABNORMAL LOW (ref 14–54)
AST: 22 U/L (ref 15–41)
Alkaline Phosphatase: 129 U/L — ABNORMAL HIGH (ref 38–126)
Anion gap: 7 (ref 5–15)
BUN: 6 mg/dL (ref 6–20)
CHLORIDE: 108 mmol/L (ref 101–111)
CO2: 23 mmol/L (ref 22–32)
Calcium: 8.8 mg/dL — ABNORMAL LOW (ref 8.9–10.3)
Creatinine, Ser: 0.84 mg/dL (ref 0.44–1.00)
GFR calc Af Amer: >60 mL/min (ref >60)
GFR calc non Af Amer: >60 mL/min (ref >60)
GLUCOSE: 87 mg/dL (ref 65–99)
POTASSIUM: 3.3 mmol/L — AB (ref 3.5–5.1)
Sodium: 138 mmol/L (ref 135–145)
Total Bilirubin: 0.5 mg/dL (ref 0.3–1.2)
Total Protein: 6.8 g/dL (ref 6.5–8.1)

## 2016-11-08 LAB — CBC
HCT: 32.7 % — ABNORMAL LOW (ref 35.0–47.0)
HEMOGLOBIN: 11.3 g/dL — AB (ref 12.0–16.0)
MCH: 28.6 pg (ref 26.0–34.0)
MCHC: 34.4 g/dL (ref 32.0–36.0)
MCV: 83.2 fL (ref 80.0–100.0)
Platelets: 150 10*3/uL (ref 150–440)
RBC: 3.94 MIL/uL (ref 3.80–5.20)
RDW: 13.7 % (ref 11.5–14.5)
WBC: 9.4 10*3/uL (ref 3.6–11.0)

## 2016-11-08 LAB — WET PREP, GENITAL
CLUE CELLS WET PREP: NONE SEEN
Trich, Wet Prep: NONE SEEN
Yeast Wet Prep HPF POC: NONE SEEN

## 2016-11-08 LAB — CHLAMYDIA/NGC RT PCR (ARMC ONLY)
Chlamydia Tr: NOT DETECTED
N gonorrhoeae: NOT DETECTED

## 2016-11-08 LAB — AMYLASE: Amylase: 59 U/L (ref 28–100)

## 2016-11-08 LAB — LIPASE, BLOOD: Lipase: 24 U/L (ref 11–51)

## 2016-11-08 NOTE — Discharge Instructions (Signed)
Abdominal Pain During Pregnancy °Belly (abdominal) pain is common during pregnancy. Most of the time, it is not a serious problem. Other times, it can be a sign that something is wrong with the pregnancy. Always tell your doctor if you have belly pain. °Follow these instructions at home: °Monitor your belly pain for any changes. The following actions may help you feel better: °· Do not have sex (intercourse) or put anything in your vagina until you feel better. °· Rest until your pain stops. °· Drink clear fluids if you feel sick to your stomach (nauseous). Do not eat solid food until you feel better. °· Only take medicine as told by your doctor. °· Keep all doctor visits as told. °Get help right away if: °· You are bleeding, leaking fluid, or pieces of tissue come out of your vagina. °· You have more pain or cramping. °· You keep throwing up (vomiting). °· You have pain when you pee (urinate) or have blood in your pee. °· You have a fever. °· You do not feel your baby moving as much. °· You feel very weak or feel like passing out. °· You have trouble breathing, with or without belly pain. °· You have a very bad headache and belly pain. °· You have fluid leaking from your vagina and belly pain. °· You keep having watery poop (diarrhea). °· Your belly pain does not go away after resting, or the pain gets worse. °This information is not intended to replace advice given to you by your health care provider. Make sure you discuss any questions you have with your health care provider. °Document Released: 08/18/2009 Document Revised: 04/07/2016 Document Reviewed: 03/29/2013 °Elsevier Interactive Patient Education © 2017 [employer]vier Inc. ° °

## 2016-11-09 LAB — URINE CULTURE

## 2016-11-12 NOTE — Discharge Summary (Signed)
April Tran is a 26 y.o. female. She is at 19w5dgestation. Patient's last menstrual period was 03/13/2016 (approximate). Estimated Date of Delivery: 12/12/16  Chief Complaint: abdominal pain  S:   Location: low abdomen Context: patient had sudden onset severe low abdominal pain doubling her over and causing her not to be able to stand up straight, after a normal dinner.  She did have intercourse in the last 24hours  Onset/timing/duration: sudden, 20:00 tonight Quality: sharp  Severity: severe Aggravating factors: nothing Alleviating factors: nothing Associated signs/symptoms:  She denies any recent diarrhea, illness, nausea/vomiting, fever/chills.  no CTX, no VB.no LOF,  Active fetal movement.      Maternal Medical History:   Past Medical History:  Diagnosis Date  . Depression     Past Surgical History:  Procedure Laterality Date  . TONSILLECTOMY      Allergies  Allergen Reactions  . Morphine And Related Hypertension    Prior to Admission medications   Medication Sig Start Date End Date Taking? Authorizing Provider  oxyCODONE-acetaminophen (ROXICET) 5-325 MG tablet Take 1 tablet by mouth every 6 (six) hours as needed. Patient not taking: Reported on 10/15/2016 06/02/16   JSchuyler Amor MD  sertraline (ZOLOFT) 25 MG tablet Take 25 mg by mouth daily.    Historical Provider, MD     [place]  reports that she has never smoked. She has never used smokeless tobacco. She reports that she does not drink alcohol or use drugs.  Family History: no history of gyn cancers  Review of Systems: A full review of systems was performed and negative except as noted in the HPI.     O:  BP 117/72   Pulse 86   Temp 97.9 F (36.6 C) (Oral)   Resp 18   Ht _0  (1.676 m)   Wt 106.1 kg (234 lb)   LMP 03/13/2016 (Approximate)   BMI 37.77 kg/m  No results found for this or any previous visit (from the past 48 hour(s)).    Constitutional: NAD, AAOx3  HE/ENT: extraocular movements grossly intact, moist mucous membranes CV: RRR PULM: nl respiratory effort, CTABL     Abd: gravid, non-tender, mildly distended, soft      Ext: Non-tender, Nonedmeatous   Psych: mood appropriate, speech normal Pelvic: closed high  FHT: 125 mod + accels no decels TOCO: irritable/occasional  Recent Results (from the past 2160 hour(s))  Urinalysis, Routine w reflex microscopic     Status: Abnormal   Collection Time: 11/07/16 11:02 PM  Result Value Ref Range   Color, Urine STRAW (A) YELLOW   APPearance CLEAR (A) CLEAR   Specific Gravity, Urine 1.006 1.005 - 1.030   pH 6.0 5.0 - 8.0   Glucose, UA >=500 (A) NEGATIVE mg/dL   Hgb urine dipstick NEGATIVE NEGATIVE   Bilirubin Urine NEGATIVE NEGATIVE   Ketones, ur NEGATIVE NEGATIVE mg/dL   Protein, ur NEGATIVE NEGATIVE mg/dL   Nitrite NEGATIVE NEGATIVE   Leukocytes, UA NEGATIVE NEGATIVE   RBC / HPF 0-5 0 - 5 RBC/hpf   WBC, UA 0-5 0 - 5 WBC/hpf   Bacteria, UA NONE SEEN NONE SEEN   Squamous Epithelial / LPF 0-5 (A) NONE SEEN   Mucous PRESENT   Urine Drug Screen, Qualitative ([facility] only)     Status: None   Collection Time: 11/07/16 11:02 PM  Result Value Ref Range   Tricyclic, Ur Screen NONE DETECTED NONE DETECTED   Amphetamines, Ur  Screen NONE DETECTED NONE DETECTED   MDMA (Ecstasy)Ur Screen NONE DETECTED NONE DETECTED   Cocaine Metabolite,Ur [place] NONE DETECTED NONE DETECTED   Opiate, Ur Screen NONE DETECTED NONE DETECTED   Phencyclidine (PCP) Ur S NONE DETECTED NONE DETECTED   Cannabinoid 50 Ng, Ur [place] NONE DETECTED NONE DETECTED   Barbiturates, Ur Screen NONE DETECTED NONE DETECTED   Benzodiazepine, Ur Scrn NONE DETECTED NONE DETECTED   Methadone Scn, Ur NONE DETECTED NONE DETECTED    Comment: (NOTE) 283  Tricyclics, urine               Cutoff 1000 ng/mL 200  Amphetamines, urine             Cutoff 1000 ng/mL 300  MDMA (Ecstasy), urine           Cutoff 500 ng/mL 400   Cocaine Metabolite, urine       Cutoff 300 ng/mL 500  Opiate, urine                   Cutoff 300 ng/mL 600  Phencyclidine (PCP), urine      Cutoff 25 ng/mL 700  Cannabinoid, urine              Cutoff 50 ng/mL 800  Barbiturates, urine             Cutoff 200 ng/mL 900  Benzodiazepine, urine           Cutoff 200 ng/mL 1000 Methadone, urine                Cutoff 300 ng/mL 1100 1200 The urine drug screen provides only a preliminary, unconfirmed 1300 analytical test result and should not be used for non-medical 1400 purposes. Clinical consideration and professional judgment should 1500 be applied to any positive drug screen result due to possible 1600 interfering substances. A more specific alternate chemical method 1700 must be used in order to obtain a confirmed analytical result.  1800 Gas chromato graphy / mass spectrometry (GC/MS) is the preferred 1900 confirmatory method.   Wet prep, genital     Status: Abnormal   Collection Time: 11/07/16 11:02 PM  Result Value Ref Range   Yeast Wet Prep HPF POC NONE SEEN NONE SEEN   Trich, Wet Prep NONE SEEN NONE SEEN   Clue Cells Wet Prep HPF POC NONE SEEN NONE SEEN   WBC, Wet Prep HPF POC FEW (A) NONE SEEN   Sperm PRESENT   Urine culture     Status: Abnormal   Collection Time: 11/07/16 11:02 PM  Result Value Ref Range   Specimen Description URINE, CLEAN CATCH    Special Requests NONE    Culture MULTIPLE SPECIES PRESENT, SUGGEST RECOLLECTION (A)    Report Status 11/09/2016 FINAL   Comprehensive metabolic panel     Status: Abnormal   Collection Time: 11/07/16 11:58 PM  Result Value Ref Range   Sodium 138 135 - 145 mmol/L   Potassium 3.3 (L) 3.5 - 5.1 mmol/L   Chloride 108 101 - 111 mmol/L   CO2 23 22 - 32 mmol/L   Glucose, Bld 87 65 - 99 mg/dL   BUN 6 6 - 20 mg/dL   Creatinine, Ser 0.84 0.44 - 1.00 mg/dL   Calcium 8.8 (L) 8.9 - 10.3 mg/dL   Total Protein 6.8 6.5 - 8.1 g/dL   Albumin 2.9 (L) 3.5 - 5.0 g/dL   AST 22 15 - 41 U/L   ALT 12  (L) 14 - 54 U/L  Alkaline Phosphatase 129 (H) 38 - 126 U/L   Total Bilirubin 0.5 0.3 - 1.2 mg/dL   GFR calc non Af Amer >60 >60 mL/min   GFR calc Af Amer >60 >60 mL/min    Comment: (NOTE) The eGFR has been calculated using the CKD EPI equation. This calculation has not been validated in all clinical situations. eGFR's persistently <60 mL/min signify possible Chronic Kidney Disease.    Anion gap 7 5 - 15  Lipase, blood     Status: None   Collection Time: 11/07/16 11:58 PM  Result Value Ref Range   Lipase 24 11 - 51 U/L  CBC     Status: Abnormal   Collection Time: 11/07/16 11:58 PM  Result Value Ref Range   WBC 9.4 3.6 - 11.0 K/uL   RBC 3.94 3.80 - 5.20 MIL/uL   Hemoglobin 11.3 (L) 12.0 - 16.0 g/dL   HCT 32.7 (L) 35.0 - 47.0 %   MCV 83.2 80.0 - 100.0 fL   MCH 28.6 26.0 - 34.0 pg   MCHC 34.4 32.0 - 36.0 g/dL   RDW 13.7 11.5 - 14.5 %   Platelets 150 150 - 440 K/uL  Amylase     Status: None   Collection Time: 11/07/16 11:58 PM  Result Value Ref Range   Amylase 59 28 - 100 U/L  Chlamydia/NGC rt PCR ([facility] only)     Status: None   Collection Time: 11/08/16  2:23 AM  Result Value Ref Range   Specimen source GC/Chlam URINE, RANDOM    Chlamydia Tr NOT DETECTED NOT DETECTED   N gonorrhoeae NOT DETECTED NOT DETECTED    Comment: (NOTE) 100  This methodology has not been evaluated in pregnant women or in 200  patients with a history of hysterectomy. 300 400  This methodology will not be performed on patients less than 46  years of age.      US Ob Limited  Result Date: 11/08/2016 CLINICAL DATA:  Pelvic pain. EXAM: LIMITED OBSTETRIC ULTRASOUND FINDINGS: Number of Fetuses: 1 Heart Rate:  127 Bpm Movement: Present Presentation: Cephalic Placental Location: Anterior Previa: None Amniotic Fluid (Subjective):  Within normal limits. BPD:  9.0cm 36w  2d MATERNAL FINDINGS: Cervix:  Not visualized due to advanced gestational age. Uterus/Adnexae:  No abnormality visualized.  Ovaries not  visualized. IMPRESSION: Single viable intrauterine pregnancy at 36 weeks 2 days. Fetal heart rate is 127 beats per minute. Clinical correlation is suggested. These results will be called to the ordering clinician or representative by the Radiologist Assistant, and communication documented in the PACS or zVision Dashboard. This exam is performed on an emergent basis and does not comprehensively evaluate fetal size, dating, or anatomy; follow-up complete OB US should be considered if further fetal assessment is warranted. Electronically Signed   By: Marcello Moores  Register   On: 11/08/2016 08:49    A/P:  [id number] [id number] @ 35.5 with abdominal pain.   Labor: not present.   Fetal Wellbeing: Reassuring Cat 1 tracing.  IV fluids given in a bolus.  During bolus patient fell asleep and once awakened had no more pain.  US shows no abnormality, no placental separation.  Likely GI source.  D/c home stable, precautions reviewed, follow-up as scheduled.   ----- Larey Days, MD Attending Obstetrician and Gynecologist [facility], Department of [facility]

## 2016-11-15 ENCOUNTER — Inpatient Hospital Stay
Admission: EM | Admit: 2016-11-15 | Discharge: 2016-11-15 | Disposition: A | Discharge status: Home / Self Care | Payer: Medicaid Other | Attending: Obstetrics and Gynecology | Admitting: Obstetrics and Gynecology

## 2016-11-15 DIAGNOSIS — D649 Anemia, unspecified: Secondary | ICD-10-CM | POA: Diagnosis not present

## 2016-11-15 DIAGNOSIS — O99013 Anemia complicating pregnancy, third trimester: Secondary | ICD-10-CM | POA: Diagnosis not present

## 2016-11-15 DIAGNOSIS — Z3A36 36 weeks gestation of pregnancy: Secondary | ICD-10-CM | POA: Diagnosis not present

## 2016-11-15 MED ORDER — TERBUTALINE SULFATE 1 MG/ML IJ SOLN
0.2500 mg | Freq: Once | INTRAMUSCULAR | Status: AC
  Administered 2016-11-15: 0.25 mg via SUBCUTANEOUS

## 2016-11-15 MED ORDER — TERBUTALINE SULFATE 1 MG/ML IJ SOLN
INTRAMUSCULAR | Status: AC
  Administered 2016-11-15: 0.25 mg via SUBCUTANEOUS
  Filled 2016-11-15: qty 1

## 2016-11-15 NOTE — OB Triage Note (Signed)
Pt arrived to obs rm 3 via ED with c/o contractions starting at 0400 this morning coming 3-4 minutes with 5/10 pain score. Pt states no LOF, Bleeding, but this have sex last night and lots of walking the previous date. Pt states trying to stay hydrated. Last cervical exam was last week and pt stated she was closed. Good FM. Pt placed on monitor and oriented to room. Will cont. To monitor.

## 2016-11-15 NOTE — Progress Notes (Addendum)
Rita OharaChristian L Swaz[place] is a 26 y.o. G2P1001 at 3870w1d by unsure LMP in June 2017 and EDD by US in early preg of 12/12/16  admitted for Th PTL. UC's are q 5 mins after sex last pm. PNC significant for anemia (Fe def) this pregnancy.   Subjective: "My contractions are hurting 5-10 on 1-10 scale  Objective: BP 127/78   Pulse 94   Temp 97.8 F (36.6 C) (Oral)   LMP 03/13/2016 (Approximate)  No intake/output data recorded. No intake/output data recorded.  FHT:  135, Cat 1 UC:   q1 1/2 -6 mins  SVE:    Closed/long  Labs: Lab Results  Component Value Date   WBC 9.4 11/07/2016   HGB 11.3 (L) 11/07/2016   HCT 32.7 (L) 11/07/2016   MCV 83.2 11/07/2016   PLT 150 11/07/2016    Assessment / Plan: 1. IUP at 36 1/7 weeks 2. Anemia 3. UC's secondary to sex last pm 4. Continue to monitor, consider Terb x 1 dose.  Labor: continue to monitor UC's Preeclampsia:No S/S Fetal Wellbeing: Cat 1, no decels, 135 Pain Control:  None needed at present I/D: none Anticipated MOD: Not at present time Dr Elesa MassedWard aware of pt status and agrees with plan of care  Sharee Pimplearon W Jones 11/15/2016, 10:35 AM

## 2016-11-15 NOTE — Discharge Instructions (Signed)
Discharge instructions given, patient stated understanding. Labor precautions given.  °

## 2016-11-15 NOTE — Discharge Summary (Signed)
Obstetric Discharge Summary Reason for Admission: onset of labor Prenatal Procedures: Fetal and uterine monitoring Intrapartum Procedures: none Postpartum Procedures: None Complications-Operative and Postpartum:None Hemoglobin  Date Value Ref Range Status  11/07/2016 11.3 (L) 12.0 - 16.0 g/dL Final   HGB  Date Value Ref Range Status  01/10/2015 14.3 12.0 - 16.0 g/dL Final   HCT  Date Value Ref Range Status  11/07/2016 32.7 (L) 35.0 - 47.0 % Final  01/10/2015 42.7 35.0 - 47.0 % Final    Physical Exam:  General: A,A&O x3 Resp reg and non-labored VSS, FHR Cat I strip, no decel, NST reactive with 2 accels 15 x 15 BPM Discharge Diagnoses: Th PTL at 36 1/7 weeks  Discharge Information: Date: 11/15/2016 Activity: unrestricted Diet: regular Medications: PNV and Iron Condition: stable Instructions: FU as scheduled, Home to rest, drink po fluids today Discharge to: home   Newborn Data: This patient has no babies on file.  April Tran 11/15/2016, 12:30 PM

## 2016-11-20 ENCOUNTER — Inpatient Hospital Stay
Admission: EM | Admit: 2016-11-20 | Discharge: 2016-11-20 | Disposition: A | Discharge status: Home / Self Care | Payer: Medicaid Other | Attending: Obstetrics and Gynecology | Admitting: Obstetrics and Gynecology

## 2016-11-20 ENCOUNTER — Encounter: Payer: Self-pay | Admitting: *Deleted

## 2016-11-20 DIAGNOSIS — R52 Pain, unspecified: Secondary | ICD-10-CM | POA: Diagnosis present

## 2016-11-20 DIAGNOSIS — M545 Low back pain: Secondary | ICD-10-CM | POA: Diagnosis not present

## 2016-11-20 DIAGNOSIS — R1084 Generalized abdominal pain: Secondary | ICD-10-CM | POA: Diagnosis not present

## 2016-11-20 DIAGNOSIS — Z3A36 36 weeks gestation of pregnancy: Secondary | ICD-10-CM | POA: Insufficient documentation

## 2016-11-20 DIAGNOSIS — R109 Unspecified abdominal pain: Secondary | ICD-10-CM

## 2016-11-20 DIAGNOSIS — O26893 Other specified pregnancy related conditions, third trimester: Secondary | ICD-10-CM | POA: Diagnosis not present

## 2016-11-20 LAB — COMPREHENSIVE METABOLIC PANEL
ALK PHOS: 133 U/L — AB (ref 38–126)
ALT: 14 U/L (ref 14–54)
AST: 28 U/L (ref 15–41)
Albumin: 2.7 g/dL — ABNORMAL LOW (ref 3.5–5.0)
Anion gap: 10 (ref 5–15)
BUN: 5 mg/dL — AB (ref 6–20)
CALCIUM: 8.4 mg/dL — AB (ref 8.9–10.3)
CHLORIDE: 105 mmol/L (ref 101–111)
CO2: 22 mmol/L (ref 22–32)
CREATININE: 0.64 mg/dL (ref 0.44–1.00)
Glucose, Bld: 157 mg/dL — ABNORMAL HIGH (ref 65–99)
Potassium: 3.2 mmol/L — ABNORMAL LOW (ref 3.5–5.1)
SODIUM: 137 mmol/L (ref 135–145)
Total Bilirubin: 0.6 mg/dL (ref 0.3–1.2)
Total Protein: 6.7 g/dL (ref 6.5–8.1)

## 2016-11-20 LAB — URINALYSIS, COMPLETE (UACMP) WITH MICROSCOPIC
BILIRUBIN URINE: NEGATIVE
Glucose, UA: >=500 mg/dL — AB
Hgb urine dipstick: NEGATIVE
KETONES UR: NEGATIVE mg/dL
Nitrite: NEGATIVE
PROTEIN: NEGATIVE mg/dL
Specific Gravity, Urine: 1.01 (ref 1.005–1.030)
pH: 6 (ref 5.0–8.0)

## 2016-11-20 LAB — URINE DRUG SCREEN, QUALITATIVE (ARMC ONLY)
AMPHETAMINES, UR SCREEN: NOT DETECTED
BARBITURATES, UR SCREEN: NOT DETECTED
BENZODIAZEPINE, UR SCRN: NOT DETECTED
Cannabinoid 50 Ng, Ur ~~LOC~~: NOT DETECTED
Cocaine Metabolite,Ur ~~LOC~~: NOT DETECTED
MDMA (Ecstasy)Ur Screen: NOT DETECTED
METHADONE SCREEN, URINE: NOT DETECTED
Opiate, Ur Screen: NOT DETECTED
Phencyclidine (PCP) Ur S: NOT DETECTED
TRICYCLIC, UR SCREEN: NOT DETECTED

## 2016-11-20 LAB — CBC WITH DIFFERENTIAL/PLATELET
BASOS ABS: 0 10*3/uL (ref 0–0.1)
Basophils Relative: 0 %
EOS PCT: 1 %
Eosinophils Absolute: 0.1 10*3/uL (ref 0–0.7)
HCT: 32.2 % — ABNORMAL LOW (ref 35.0–47.0)
Hemoglobin: 11.3 g/dL — ABNORMAL LOW (ref 12.0–16.0)
LYMPHS ABS: 1.5 10*3/uL (ref 1.0–3.6)
LYMPHS PCT: 18 %
MCH: 28.9 pg (ref 26.0–34.0)
MCHC: 35 g/dL (ref 32.0–36.0)
MCV: 82.6 fL (ref 80.0–100.0)
Monocytes Absolute: 0.7 10*3/uL (ref 0.2–0.9)
Monocytes Relative: 8 %
NEUTROS PCT: 73 %
Neutro Abs: 6.4 10*3/uL (ref 1.4–6.5)
PLATELETS: 144 10*3/uL — AB (ref 150–440)
RBC: 3.9 MIL/uL (ref 3.80–5.20)
RDW: 14.3 % (ref 11.5–14.5)
WBC: 8.7 10*3/uL (ref 3.6–11.0)

## 2016-11-20 LAB — LIPASE, BLOOD: Lipase: 21 U/L (ref 11–51)

## 2016-11-20 LAB — AMYLASE: Amylase: 64 U/L (ref 28–100)

## 2016-11-20 MED ORDER — FENTANYL CITRATE (PF) 100 MCG/2ML IJ SOLN
50.0000 ug | Freq: Once | INTRAMUSCULAR | Status: AC
  Administered 2016-11-20: 50 ug via INTRAVENOUS
  Filled 2016-11-20: qty 2

## 2016-11-20 MED ORDER — LACTATED RINGERS IV BOLUS (SEPSIS)
1000.0000 mL | Freq: Once | INTRAVENOUS | Status: AC
  Administered 2016-11-20: 1000 mL via INTRAVENOUS

## 2016-11-20 MED ORDER — FENTANYL CITRATE (PF) 100 MCG/2ML IJ SOLN: 100.0000 ug | Freq: Once | INTRAMUSCULAR | Status: DC

## 2016-11-20 NOTE — OB Triage Provider Note (Signed)
History     CSN: [id number]  Arrival date and time: 11/20/16 1740   None     Chief Complaint  Patient presents with  . Pain   HPI  April Tran is a 26 yo G2P1 at 36+6 weeks presenting with sudden onset severe abdominal pain and low back pain that shoots down to her legs.  She states the pain started after eating a big meal and she was trying to get into her truck.  She states the pain is "all over" and starts at the top of her abdomen and shoots down her belly.  She states her low back pain also shoots down her legs and makes her legs numb.  She endorses good fetal movement.  She denies vaginal bleeding, LOF, abnormal discharge, dysuria, n/v/c/d, fever, chills.  She rates the pain a 6/10 and is requesting pain medication.     Past Medical History:  Diagnosis Date  . Depression     Past Surgical History:  Procedure Laterality Date  . TONSILLECTOMY    . WISDOM TOOTH EXTRACTION      History reviewed. No pertinent family history.  Social History  Substance Use Topics  . Smoking status: Never Smoker  . Smokeless tobacco: Never Used  . Alcohol use No    Allergies:  Allergies  Allergen Reactions  . Morphine And Related Hypertension    Prescriptions Prior to Admission  Medication Sig Dispense Refill Last Dose  . prenatal vitamin w/FE, FA (PRENATAL 1 + 1) 27-1 MG TABS tablet Take 1 tablet by mouth daily at 12 noon.   11/19/2016 at Unknown time  . sertraline (ZOLOFT) 25 MG tablet Take 25 mg by mouth daily.   11/19/2016 at Unknown time    Review of Systems  Constitutional: Negative.   HENT: Negative.   Respiratory: Negative.   Cardiovascular: Negative.   Gastrointestinal: Positive for abdominal pain. Negative for diarrhea, nausea and vomiting.  Genitourinary: Positive for pelvic pain. Negative for dysuria, vaginal bleeding and vaginal discharge.  Musculoskeletal: Negative.   Skin: Negative.   Allergic/Immunologic: Negative.   Neurological: Negative.    Psychiatric/Behavioral: Negative.    Physical Exam   Blood pressure (!) 144/76, pulse (!) 104, temperature 97.5 F (36.4 C), temperature source Oral, resp. rate (!) 42, height 5\' 6"  (1.676 m), weight 106.6 kg (235 lb), last menstrual period 03/13/2016, SpO2 99 %.  Physical Exam  Constitutional: She is oriented to person, place, and time. She appears well-developed and well-nourished.  HENT:  Head: Normocephalic.  Cardiovascular: Normal rate and regular rhythm.   Respiratory: Effort normal and breath sounds normal.  GI: Soft. There is tenderness.  Genitourinary:  Genitourinary Comments: Uterus: mild tenderness, gravid SVE: Dilation: 2 Effacement (%): 70 Cervical Position: Posterior, Middle Station: -3 Presentation: Vertex Exam by:: Kattleya Kuhnert   Musculoskeletal: Normal range of motion.  Neurological: She is alert and oriented to person, place, and time.  Skin: Skin is warm and dry.   Baseline: 135 bpm/ moderate variability/ +accels/ no decels Toco: irregular ctxs    Procedures  Results for orders placed or performed during the hospital encounter of 11/20/16 (from the past 24 hour(s))  CBC with Differential/Platelet     Status: Abnormal   Collection Time: 11/20/16  6:43 PM  Result Value Ref Range   WBC 8.7 3.6 - 11.0 K/uL   RBC 3.90 3.80 - 5.20 MIL/uL   Hemoglobin 11.3 (L) 12.0 - 16.0 g/dL   HCT 82.9 (L) 56.2 - 13.0 %   MCV 82.6 80.0 -  100.0 fL   MCH 28.9 26.0 - 34.0 pg   MCHC 35.0 32.0 - 36.0 g/dL   RDW 16.1 09.6 - 04.5 %   Platelets 144 (L) 150 - 440 K/uL   Neutrophils Relative % 73 %   Neutro Abs 6.4 1.4 - 6.5 K/uL   Lymphocytes Relative 18 %   Lymphs Abs 1.5 1.0 - 3.6 K/uL   Monocytes Relative 8 %   Monocytes Absolute 0.7 0.2 - 0.9 K/uL   Eosinophils Relative 1 %   Eosinophils Absolute 0.1 0 - 0.7 K/uL   Basophils Relative 0 %   Basophils Absolute 0.0 0 - 0.1 K/uL  Comprehensive metabolic panel     Status: Abnormal   Collection Time: 11/20/16  6:43 PM  Result  Value Ref Range   Sodium 137 135 - 145 mmol/L   Potassium 3.2 (L) 3.5 - 5.1 mmol/L   Chloride 105 101 - 111 mmol/L   CO2 22 22 - 32 mmol/L   Glucose, Bld 157 (H) 65 - 99 mg/dL   BUN 5 (L) 6 - 20 mg/dL   Creatinine, Ser 4.09 0.44 - 1.00 mg/dL   Calcium 8.4 (L) 8.9 - 10.3 mg/dL   Total Protein 6.7 6.5 - 8.1 g/dL   Albumin 2.7 (L) 3.5 - 5.0 g/dL   AST 28 15 - 41 U/L   ALT 14 14 - 54 U/L   Alkaline Phosphatase 133 (H) 38 - 126 U/L   Total Bilirubin 0.6 0.3 - 1.2 mg/dL   GFR calc non Af Amer >60 >60 mL/min   GFR calc Af Amer >60 >60 mL/min   Anion gap 10 5 - 15  Amylase     Status: None   Collection Time: 11/20/16  6:43 PM  Result Value Ref Range   Amylase 64 28 - 100 U/L  Lipase, blood     Status: None   Collection Time: 11/20/16  6:43 PM  Result Value Ref Range   Lipase 21 11 - 51 U/L  Urinalysis, Complete w Microscopic     Status: Abnormal   Collection Time: 11/20/16  6:43 PM  Result Value Ref Range   Color, Urine YELLOW (A) YELLOW   APPearance CLOUDY (A) CLEAR   Specific Gravity, Urine 1.010 1.005 - 1.030   pH 6.0 5.0 - 8.0   Glucose, UA >=500 (A) NEGATIVE mg/dL   Hgb urine dipstick NEGATIVE NEGATIVE   Bilirubin Urine NEGATIVE NEGATIVE   Ketones, ur NEGATIVE NEGATIVE mg/dL   Protein, ur NEGATIVE NEGATIVE mg/dL   Nitrite NEGATIVE NEGATIVE   Leukocytes, UA SMALL (A) NEGATIVE   RBC / HPF 0-5 0 - 5 RBC/hpf   WBC, UA 6-30 0 - 5 WBC/hpf   Bacteria, UA RARE (A) NONE SEEN   Squamous Epithelial / LPF TOO NUMEROUS TO COUNT (A) NONE SEEN   Mucous PRESENT   Urine Drug Screen, Qualitative ([facility] only)     Status: None   Collection Time: 11/20/16  6:43 PM  Result Value Ref Range   Tricyclic, Ur Screen NONE DETECTED NONE DETECTED   Amphetamines, Ur Screen NONE DETECTED NONE DETECTED   MDMA (Ecstasy)Ur Screen NONE DETECTED NONE DETECTED   Cocaine Metabolite,Ur [place] NONE DETECTED NONE DETECTED   Opiate, Ur Screen NONE DETECTED NONE DETECTED   Phencyclidine (PCP) Ur S NONE DETECTED  NONE DETECTED   Cannabinoid 50 Ng, Ur [place] NONE DETECTED NONE DETECTED   Barbiturates, Ur Screen NONE DETECTED NONE DETECTED   Benzodiazepine, Ur Scrn NONE DETECTED NONE DETECTED  Methadone Scn, Ur NONE DETECTED NONE DETECTED    Assessment and Plan  1. IUP at 36+6 weeks with acute severe generalized abdominal pain, contractions, and low back pain    - CBC, CMP, UDS, UA, Amylase, Lipase: WNL   - Continuous monitoring    -  Insert IV   - LR Bolus x 1 liter   - LR at 13925mL/hr   - Fentanyl 50mcg IVP x 1 Kendyll Huettner C 11/20/2016, 8:21 PM   11/20/16: 2255 Pain has resolved and patient is ready to go home Labs WNL  Category 1 fetal tracing  Strict [facility]'s daily Labor precautions and warning s/s reviewed She is interested in discussing an elective induction because her husband has to go to [employer] training April 4-27th.   RTC Monday for scheduled appt  Dr. Dalbert GarnetBeasley aware, updated and agrees with plan of care  Carlean JewsMeredith Roen Macgowan, CNM

## 2016-11-20 NOTE — OB Triage Note (Signed)
Meredith at bedside to discuss POC with patient. Patient denies having any pain and states that she feels much better. Patient requests discussing scheduled C/Section with Sharyl NimrodMeredith due to her husband leaving for guard duty around time of expected due date. POC discussed with patient.   Patient understands POC. Discharge instructions reviewed with patient. Patient verbally understands and uses teachback method. Patient leaves floor ambulatory with husband at side.

## 2016-11-20 NOTE — OB Triage Note (Signed)
Sudden pain all over after dinner. Started after stepping in truck. Reports hurting from waist down in abdomen and back. April Tran, April Tran

## 2016-11-23 LAB — OB RESULTS CONSOLE RPR: RPR: NONREACTIVE

## 2016-11-23 LAB — OB RESULTS CONSOLE GBS: GBS: NEGATIVE

## 2016-11-26 ENCOUNTER — Observation Stay
Admission: EM | Admit: 2016-11-26 | Discharge: 2016-11-26 | Disposition: A | Discharge status: Home / Self Care | Payer: Medicaid Other | Attending: Obstetrics and Gynecology | Admitting: Obstetrics and Gynecology

## 2016-11-26 DIAGNOSIS — Z3A37 37 weeks gestation of pregnancy: Secondary | ICD-10-CM | POA: Insufficient documentation

## 2016-11-26 DIAGNOSIS — F329 Major depressive disorder, single episode, unspecified: Secondary | ICD-10-CM | POA: Insufficient documentation

## 2016-11-26 DIAGNOSIS — O99343 Other mental disorders complicating pregnancy, third trimester: Secondary | ICD-10-CM | POA: Diagnosis not present

## 2016-11-26 DIAGNOSIS — O471 False labor at or after 37 completed weeks of gestation: Secondary | ICD-10-CM | POA: Diagnosis not present

## 2016-11-26 LAB — PROTEIN / CREATININE RATIO, URINE
CREATININE, URINE: 63 mg/dL
Total Protein, Urine: <6 mg/dL

## 2016-11-26 MED ORDER — ZOLPIDEM TARTRATE 5 MG PO TABS
5.0000 mg | ORAL_TABLET | Freq: Once | ORAL | Status: AC
  Administered 2016-11-26: 5 mg via ORAL
  Filled 2016-11-26: qty 1

## 2016-11-26 NOTE — Discharge Summary (Signed)
Pt d/c'd to home with husband in stable condition. Given D/c instructions, including labor precautions. Verbalized understanding. Follow up appointment made. Given Ambien for rest at d/c.

## 2016-11-26 NOTE — Discharge Summary (Signed)
Obstetric Discharge Summary Reason for Admission: Labor S/S Prenatal Procedures: ultrasound Intrapartum Procedures: NST, fluids Postpartum Procedures: none Complications-Operative and Postpartum: none Hemoglobin  Date Value Ref Range Status  11/20/2016 11.3 (L) 12.0 - 16.0 g/dL Final   HGB  Date Value Ref Range Status  01/10/2015 14.3 12.0 - 16.0 g/dL Final   HCT  Date Value Ref Range Status  11/20/2016 32.2 (L) 35.0 - 47.0 % Final  01/10/2015 42.7 35.0 - 47.0 % Final    Physical Exam:  General: alert, cooperative and appears stated age Abd: Gravid,  NST reactive with no decels, 2 15 x 15 BPM accels noted Discharge Diagnoses:   Discharge Information: Date: 11/26/2016 Activity: unrestricted Diet: routine Medications: PNV Condition: stable Instructions: FU on Monday or [facility] with Labor S/S. Discharge to: home   Newborn Data: This patient has no babies on file.   April Tran 11/26/2016, 11:17 AM

## 2016-11-26 NOTE — Discharge Instructions (Signed)

## 2016-11-26 NOTE — Progress Notes (Addendum)
April Tran is a 26 y.o. G2P1001 at 3678w5d by EDD established by US.   Subjective: I am tired and want to go home and sleep  Objective: BP 136/84   Pulse 92   Temp 97.9 F (36.6 C) (Oral)   Resp 19   LMP 03/13/2016 (Approximate)  No intake/output data recorded. No intake/output data recorded.  FHT: 145, +accels, no decels, Cat 1, NST reactive with 2 accels 15 x 15 BPM UC:  q 6 mins SVE:   Dilation: 2 Effacement (%): 70, 80 Station: -2 Exam by:: J. Grindheim  Labs: Lab Results  Component Value Date   WBC 8.7 11/20/2016   HGB 11.3 (L) 11/20/2016   HCT 32.2 (L) 11/20/2016   MCV 82.6 11/20/2016   PLT 144 (L) 11/20/2016    Assessment / Plan: A: IUP at 26 5/7 weeks 2. B-H's pattern with no cervical change x 4 hours P: Home to rest [facility] 3. Monitor [facility]'s. 4. Will give Ambien 10 so pt can sleep today. Disc side effects and agrees with plan.    April Tran W April Tran 11/26/2016, 11:09 AM

## 2016-11-26 NOTE — H&P (Signed)
April Tran is a 26 y.o. female presenting [facility] with LMP of  Unsure dating    And EDD of  12/12/16 based on US in early pregnancy  At 37 5/7 weeks. Pt has partner who is in the reserves and leaving April 1 for another area. She is anxious to have her baby. She has been ambulating for one hour to see if UC's are increasing.  OB History    Gravida Para Term Preterm AB Living   2 1 1  0 0 1   SAB TAB Ectopic Multiple Live Births   0 0 0 0 1     Past Medical History:  Diagnosis Date  . Depression    Past Surgical History:  Procedure Laterality Date  . TONSILLECTOMY    . WISDOM TOOTH EXTRACTION     Family History: family history is not on file. Social History:  reports that she has never smoked. She has never used smokeless tobacco. She reports that she does not drink alcohol or use drugs.     Maternal Diabetes: 3 h GCT normal  Genetic Screening: Maternal Ultrasounds/Referrals: Normal anatomy  Fetal Ultrasounds or other Referrals:   Maternal Substance Abuse:  None Significant Maternal Medications:PNV Significant Maternal Lab Results:  See above Other Comments:   Review of Systems  Constitutional: Negative.   HENT: Negative.   Eyes: Negative.   Respiratory: Negative.   Cardiovascular: Negative.   Gastrointestinal: Negative.   Genitourinary: Negative.   Musculoskeletal: Negative.   Skin: Negative.   Neurological: Negative.   Endo/Heme/Allergies: Negative.   Psychiatric/Behavioral: Negative.    History Dilation: 2 Effacement (%): 90 Station: -2 Exam by:: J. Grindheim Blood pressure 136/84, pulse 92, temperature 97.9 F (36.6 C), temperature source Oral, resp. rate 19, last menstrual period 03/13/2016. Exam Physical Exam  Prenatal labs: ABO, Rh: --/--/A POS (09/20 1401) Antibody:  Neg Rubella:  Immune RPR:   NR HBsAg:   Neg HIV:   Neg GBS:   Neg  Assessment/Plan: A: IUP at 37 5/7 weeks 2. GBS neg P: 1. Observe for  labor progress 2. DC home if not in labor.   Sharee PimpleCaron W April Tran 11/26/2016, 8:49 AM

## 2016-12-03 ENCOUNTER — Encounter: Payer: Self-pay | Admitting: Obstetrics and Gynecology

## 2016-12-03 ENCOUNTER — Observation Stay
Admission: EM | Admit: 2016-12-03 | Discharge: 2016-12-03 | Disposition: A | Discharge status: Home / Self Care | Payer: Medicaid Other | Attending: Obstetrics and Gynecology | Admitting: Obstetrics and Gynecology

## 2016-12-03 DIAGNOSIS — O133 Gestational [pregnancy-induced] hypertension without significant proteinuria, third trimester: Secondary | ICD-10-CM | POA: Diagnosis not present

## 2016-12-03 DIAGNOSIS — O471 False labor at or after 37 completed weeks of gestation: Principal | ICD-10-CM | POA: Insufficient documentation

## 2016-12-03 DIAGNOSIS — O480 Post-term pregnancy: Secondary | ICD-10-CM | POA: Diagnosis not present

## 2016-12-03 DIAGNOSIS — Z3A00 Weeks of gestation of pregnancy not specified: Secondary | ICD-10-CM | POA: Diagnosis not present

## 2016-12-03 DIAGNOSIS — Z3490 Encounter for supervision of normal pregnancy, unspecified, unspecified trimester: Secondary | ICD-10-CM

## 2016-12-03 LAB — CBC
HEMATOCRIT: 32.5 % — AB (ref 35.0–47.0)
Hemoglobin: 11 g/dL — ABNORMAL LOW (ref 12.0–16.0)
MCH: 27.5 pg (ref 26.0–34.0)
MCHC: 33.8 g/dL (ref 32.0–36.0)
MCV: 81.4 fL (ref 80.0–100.0)
PLATELETS: 133 10*3/uL — AB (ref 150–440)
RBC: 3.99 MIL/uL (ref 3.80–5.20)
RDW: 14.8 % — AB (ref 11.5–14.5)
WBC: 9.3 10*3/uL (ref 3.6–11.0)

## 2016-12-03 LAB — COMPREHENSIVE METABOLIC PANEL
ALBUMIN: 2.7 g/dL — AB (ref 3.5–5.0)
ALT: 12 U/L — ABNORMAL LOW (ref 14–54)
ANION GAP: 9 (ref 5–15)
AST: 24 U/L (ref 15–41)
Alkaline Phosphatase: 157 U/L — ABNORMAL HIGH (ref 38–126)
BUN: 9 mg/dL (ref 6–20)
CHLORIDE: 104 mmol/L (ref 101–111)
CO2: 22 mmol/L (ref 22–32)
Calcium: 8.8 mg/dL — ABNORMAL LOW (ref 8.9–10.3)
Creatinine, Ser: 0.64 mg/dL (ref 0.44–1.00)
GFR calc Af Amer: >60 mL/min (ref >60)
GFR calc non Af Amer: >60 mL/min (ref >60)
GLUCOSE: 96 mg/dL (ref 65–99)
POTASSIUM: 3.2 mmol/L — AB (ref 3.5–5.1)
SODIUM: 135 mmol/L (ref 135–145)
Total Bilirubin: 0.5 mg/dL (ref 0.3–1.2)
Total Protein: 6.6 g/dL (ref 6.5–8.1)

## 2016-12-03 LAB — URINALYSIS, ROUTINE W REFLEX MICROSCOPIC
Bilirubin Urine: NEGATIVE
GLUCOSE, UA: NEGATIVE mg/dL
HGB URINE DIPSTICK: NEGATIVE
Ketones, ur: NEGATIVE mg/dL
LEUKOCYTES UA: NEGATIVE
Nitrite: NEGATIVE
Protein, ur: NEGATIVE mg/dL
Specific Gravity, Urine: 1.01 (ref 1.005–1.030)
pH: 6 (ref 5.0–8.0)

## 2016-12-03 LAB — PROTEIN / CREATININE RATIO, URINE
CREATININE, URINE: 59 mg/dL
Protein Creatinine Ratio: 0.1 mg/mg{Cre} (ref 0.00–0.15)
Total Protein, Urine: 6 mg/dL

## 2016-12-03 MED ORDER — ACETAMINOPHEN 325 MG PO TABS
650.0000 mg | ORAL_TABLET | Freq: Once | ORAL | Status: AC
  Administered 2016-12-03: 650 mg via ORAL

## 2016-12-03 MED ORDER — ACETAMINOPHEN 325 MG PO TABS
ORAL_TABLET | ORAL | Status: AC
  Administered 2016-12-03: 650 mg via ORAL
  Filled 2016-12-03: qty 2

## 2016-12-03 MED ORDER — ONDANSETRON 4 MG PO TBDP
ORAL_TABLET | ORAL | Status: AC
  Administered 2016-12-03: 4 mg via ORAL
  Filled 2016-12-03: qty 1

## 2016-12-03 MED ORDER — ONDANSETRON 4 MG PO TBDP
4.0000 mg | ORAL_TABLET | Freq: Once | ORAL | Status: AC
Start: 2016-12-03 — End: 2016-12-03
  Administered 2016-12-03: 4 mg via ORAL

## 2016-12-03 NOTE — Final Progress Note (Addendum)
Obstetrical Discharge Summary  Patient Name: April Tran DOB: 03/18/1991 MRN: [id number]  Date of Admission: 12/03/2016 Date of Discharge: 01/06/2017  Admitting Diagnosis: contractions at term Secondary Diagnosis: Patient Active Problem List   Diagnosis Date Noted  . Gestational hypertension, third trimester 12/09/2016  . Post-dates pregnancy 12/09/2016  . Pregnancy 12/03/2016  . Indication for care in labor and delivery, antepartum 11/26/2016  . Labor and delivery, indication for care 11/07/2016  . Uterine contractions during pregnancy 10/15/2016  . Fall at home 07/26/2016    Discharge Physical Exam:  BP 123/75   Pulse 85   Temp 97.8 F (36.6 C) (Oral)   Resp 17   LMP 03/13/2016 (Approximate)   General: NAD CV: RRR Pulm: CTABL, nl effort ABD: s/nd/nt   DVT Evaluation: LE non-ttp, no evidence of DVT on exam.  Hemoglobin  Date Value Ref Range Status  12/10/2016 10.4 (L) 12.0 - 16.0 g/dL Final   HGB  Date Value Ref Range Status  01/10/2015 14.3 12.0 - 16.0 g/dL Final   HCT  Date Value Ref Range Status  12/10/2016 30.6 (L) 35.0 - 47.0 % Final  01/10/2015 42.7 35.0 - 47.0 % Final     Plan:  Seda L Tran was discharged to home in good condition. - No labor - Reactive NST  Discharge Medications: Allergies as of 12/03/2016      Reactions   Morphine And Related Hypertension      Medication List    ASK your doctor about these medications   prenatal vitamin w/FE, FA 27-1 MG Tabs tablet Take 1 tablet by mouth daily at 12 noon.   sertraline 25 MG tablet Commonly known as:  ZOLOFT Take 25 mg by mouth daily.      NST  Baseline: 130 Variability: moderate Accelerations present x >2 Decelerations absent Time 20mins  Interpretation: reactive NST, category 1 tracing  ----- Christeen DouglasBethany Brallan Denio, MD MPH Attending Obstetrician and Gynecologist [facility], Department of OB/GYN [facility]

## 2016-12-03 NOTE — Discharge Instructions (Signed)
Discharge instructions given, all questions answered.

## 2016-12-03 NOTE — Progress Notes (Deleted)
  The note originally documented on this encounter has been moved the the encounter in which it belongs.  

## 2016-12-07 ENCOUNTER — Other Ambulatory Visit: Payer: Self-pay | Admitting: Obstetrics and Gynecology

## 2016-12-07 DIAGNOSIS — O480 Post-term pregnancy: Secondary | ICD-10-CM

## 2016-12-09 ENCOUNTER — Inpatient Hospital Stay
Admission: EM | Admit: 2016-12-09 | Discharge: 2016-12-11 | DRG: 767 | Disposition: A | Discharge status: Home / Self Care | Payer: Medicaid Other | Attending: Obstetrics and Gynecology | Admitting: Obstetrics and Gynecology

## 2016-12-09 DIAGNOSIS — E669 Obesity, unspecified: Secondary | ICD-10-CM | POA: Diagnosis present

## 2016-12-09 DIAGNOSIS — O99214 Obesity complicating childbirth: Secondary | ICD-10-CM | POA: Diagnosis present

## 2016-12-09 DIAGNOSIS — O480 Post-term pregnancy: Secondary | ICD-10-CM | POA: Diagnosis present

## 2016-12-09 DIAGNOSIS — Z833 Family history of diabetes mellitus: Secondary | ICD-10-CM | POA: Diagnosis not present

## 2016-12-09 DIAGNOSIS — Z302 Encounter for sterilization: Secondary | ICD-10-CM | POA: Diagnosis not present

## 2016-12-09 DIAGNOSIS — Z6839 Body mass index (BMI) 39.0-39.9, adult: Secondary | ICD-10-CM | POA: Diagnosis not present

## 2016-12-09 DIAGNOSIS — Z8249 Family history of ischemic heart disease and other diseases of the circulatory system: Secondary | ICD-10-CM

## 2016-12-09 DIAGNOSIS — K219 Gastro-esophageal reflux disease without esophagitis: Secondary | ICD-10-CM | POA: Diagnosis present

## 2016-12-09 DIAGNOSIS — O99344 Other mental disorders complicating childbirth: Secondary | ICD-10-CM | POA: Diagnosis present

## 2016-12-09 DIAGNOSIS — O134 Gestational [pregnancy-induced] hypertension without significant proteinuria, complicating childbirth: Secondary | ICD-10-CM | POA: Diagnosis present

## 2016-12-09 DIAGNOSIS — O9962 Diseases of the digestive system complicating childbirth: Secondary | ICD-10-CM | POA: Diagnosis present

## 2016-12-09 DIAGNOSIS — Z3A39 39 weeks gestation of pregnancy: Secondary | ICD-10-CM | POA: Diagnosis not present

## 2016-12-09 DIAGNOSIS — F329 Major depressive disorder, single episode, unspecified: Secondary | ICD-10-CM | POA: Diagnosis present

## 2016-12-09 DIAGNOSIS — O133 Gestational [pregnancy-induced] hypertension without significant proteinuria, third trimester: Secondary | ICD-10-CM | POA: Diagnosis present

## 2016-12-09 DIAGNOSIS — Z6790 Unspecified blood type, Rh positive: Secondary | ICD-10-CM

## 2016-12-09 LAB — COMPREHENSIVE METABOLIC PANEL
ALK PHOS: 175 U/L — AB (ref 38–126)
ALT: 15 U/L (ref 14–54)
AST: 25 U/L (ref 15–41)
Albumin: 3 g/dL — ABNORMAL LOW (ref 3.5–5.0)
Anion gap: 9 (ref 5–15)
BUN: 7 mg/dL (ref 6–20)
CALCIUM: 8.8 mg/dL — AB (ref 8.9–10.3)
CHLORIDE: 104 mmol/L (ref 101–111)
CO2: 22 mmol/L (ref 22–32)
CREATININE: 0.68 mg/dL (ref 0.44–1.00)
GFR calc Af Amer: >60 mL/min (ref >60)
GFR calc non Af Amer: >60 mL/min (ref >60)
Glucose, Bld: 96 mg/dL (ref 65–99)
Potassium: 3.4 mmol/L — ABNORMAL LOW (ref 3.5–5.1)
Sodium: 135 mmol/L (ref 135–145)
Total Bilirubin: 0.7 mg/dL (ref 0.3–1.2)
Total Protein: 7 g/dL (ref 6.5–8.1)

## 2016-12-09 LAB — TYPE AND SCREEN
ABO/RH(D): A POS
Antibody Screen: NEGATIVE

## 2016-12-09 LAB — CBC
HEMATOCRIT: 32.1 % — AB (ref 35.0–47.0)
Hemoglobin: 11 g/dL — ABNORMAL LOW (ref 12.0–16.0)
MCH: 27.8 pg (ref 26.0–34.0)
MCHC: 34.4 g/dL (ref 32.0–36.0)
MCV: 80.8 fL (ref 80.0–100.0)
Platelets: 134 10*3/uL — ABNORMAL LOW (ref 150–440)
RBC: 3.97 MIL/uL (ref 3.80–5.20)
RDW: 14.8 % — AB (ref 11.5–14.5)
WBC: 9.3 10*3/uL (ref 3.6–11.0)

## 2016-12-09 LAB — PROTEIN / CREATININE RATIO, URINE
Creatinine, Urine: 79 mg/dL
Total Protein, Urine: <6 mg/dL

## 2016-12-09 LAB — RAPID HIV SCREEN (HIV 1/2 AB+AG)
HIV 1/2 ANTIBODIES: NONREACTIVE
HIV-1 P24 ANTIGEN - HIV24: NONREACTIVE

## 2016-12-09 IMAGING — CR DG CHEST 2V
1 series · 2 of 2 positions shown · non-contrast
Comparison: 05/25/2015.

CLINICAL DATA: RIGHT upper quadrant pain with shortness of breath.

EXAM:
CHEST  2 VIEW

[Series 1: dg chest 2 view · 0.14mm/px · 2 of 2 slices shown]
[im 1/2]
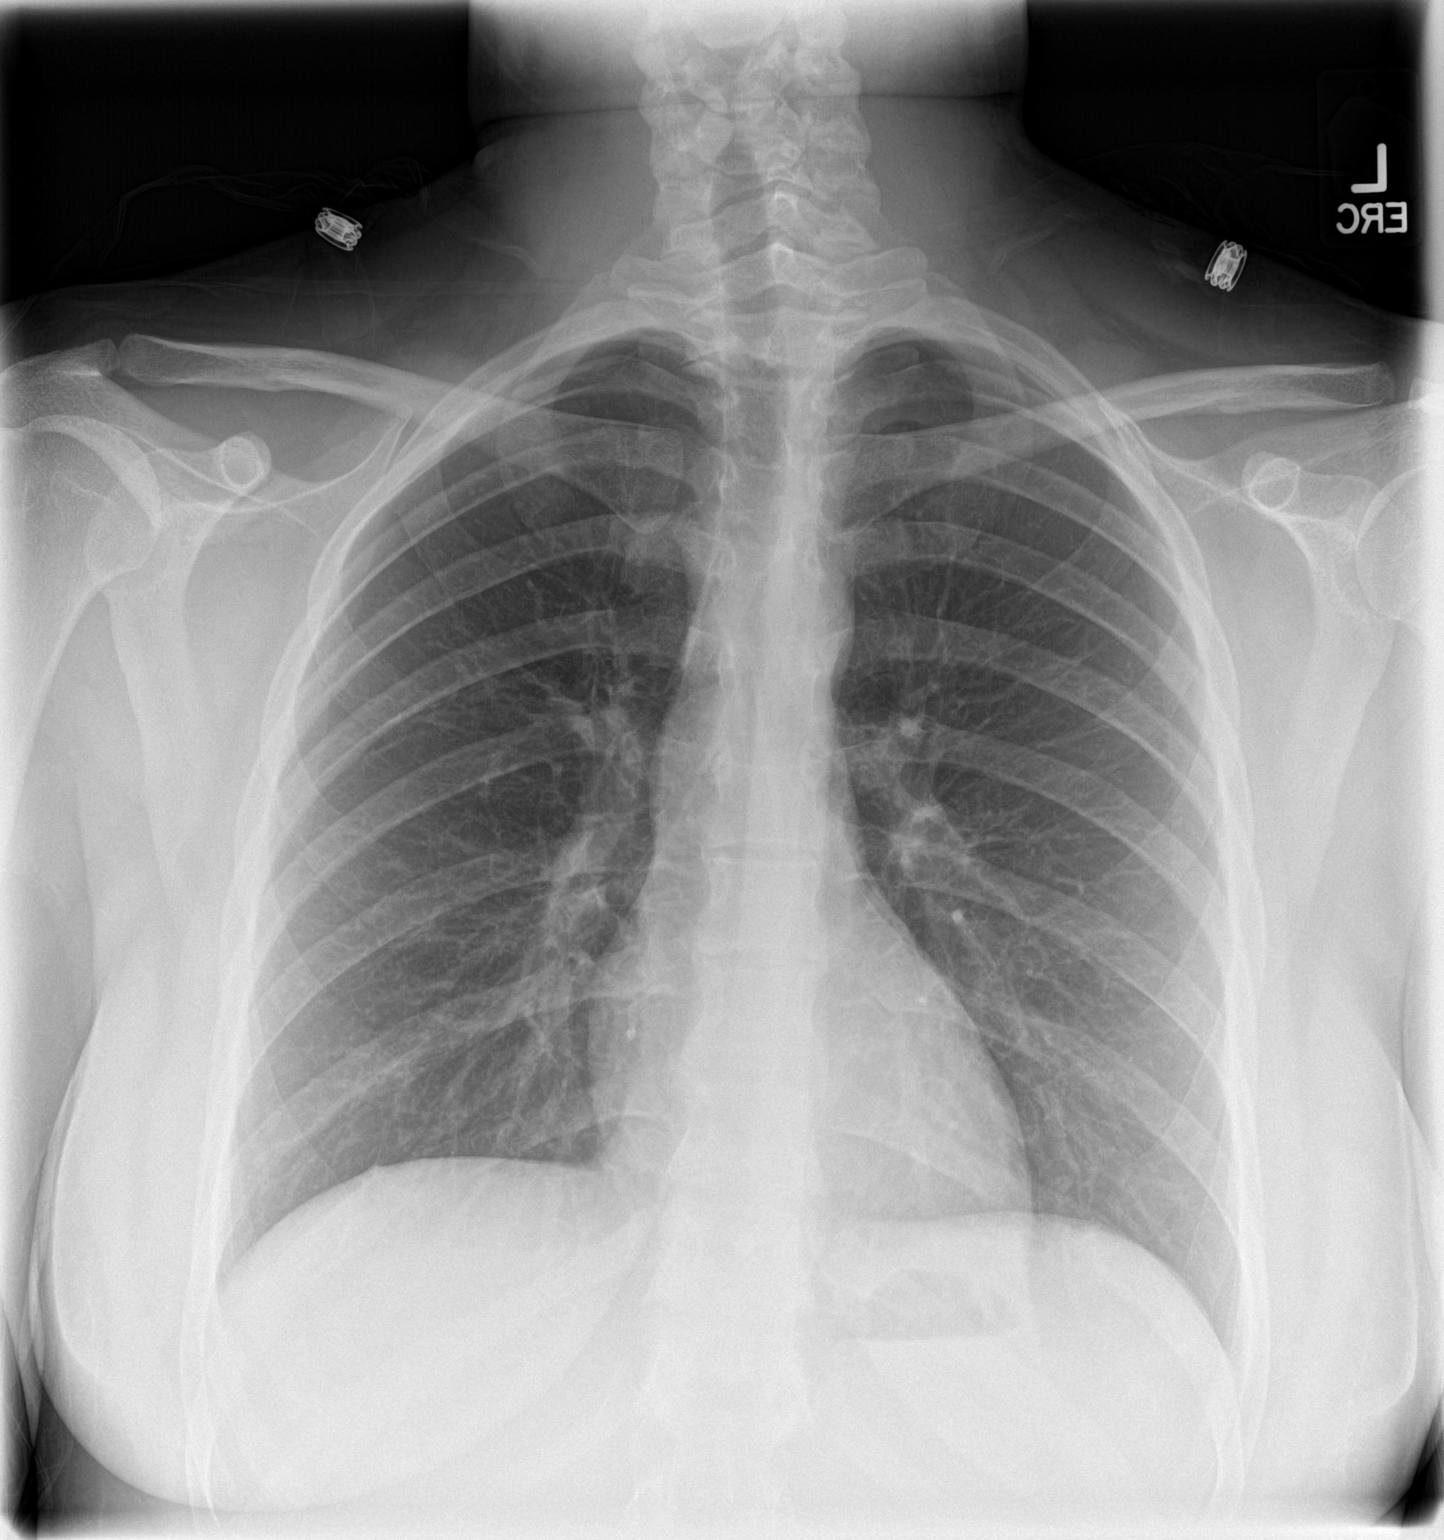
[im 2/2]
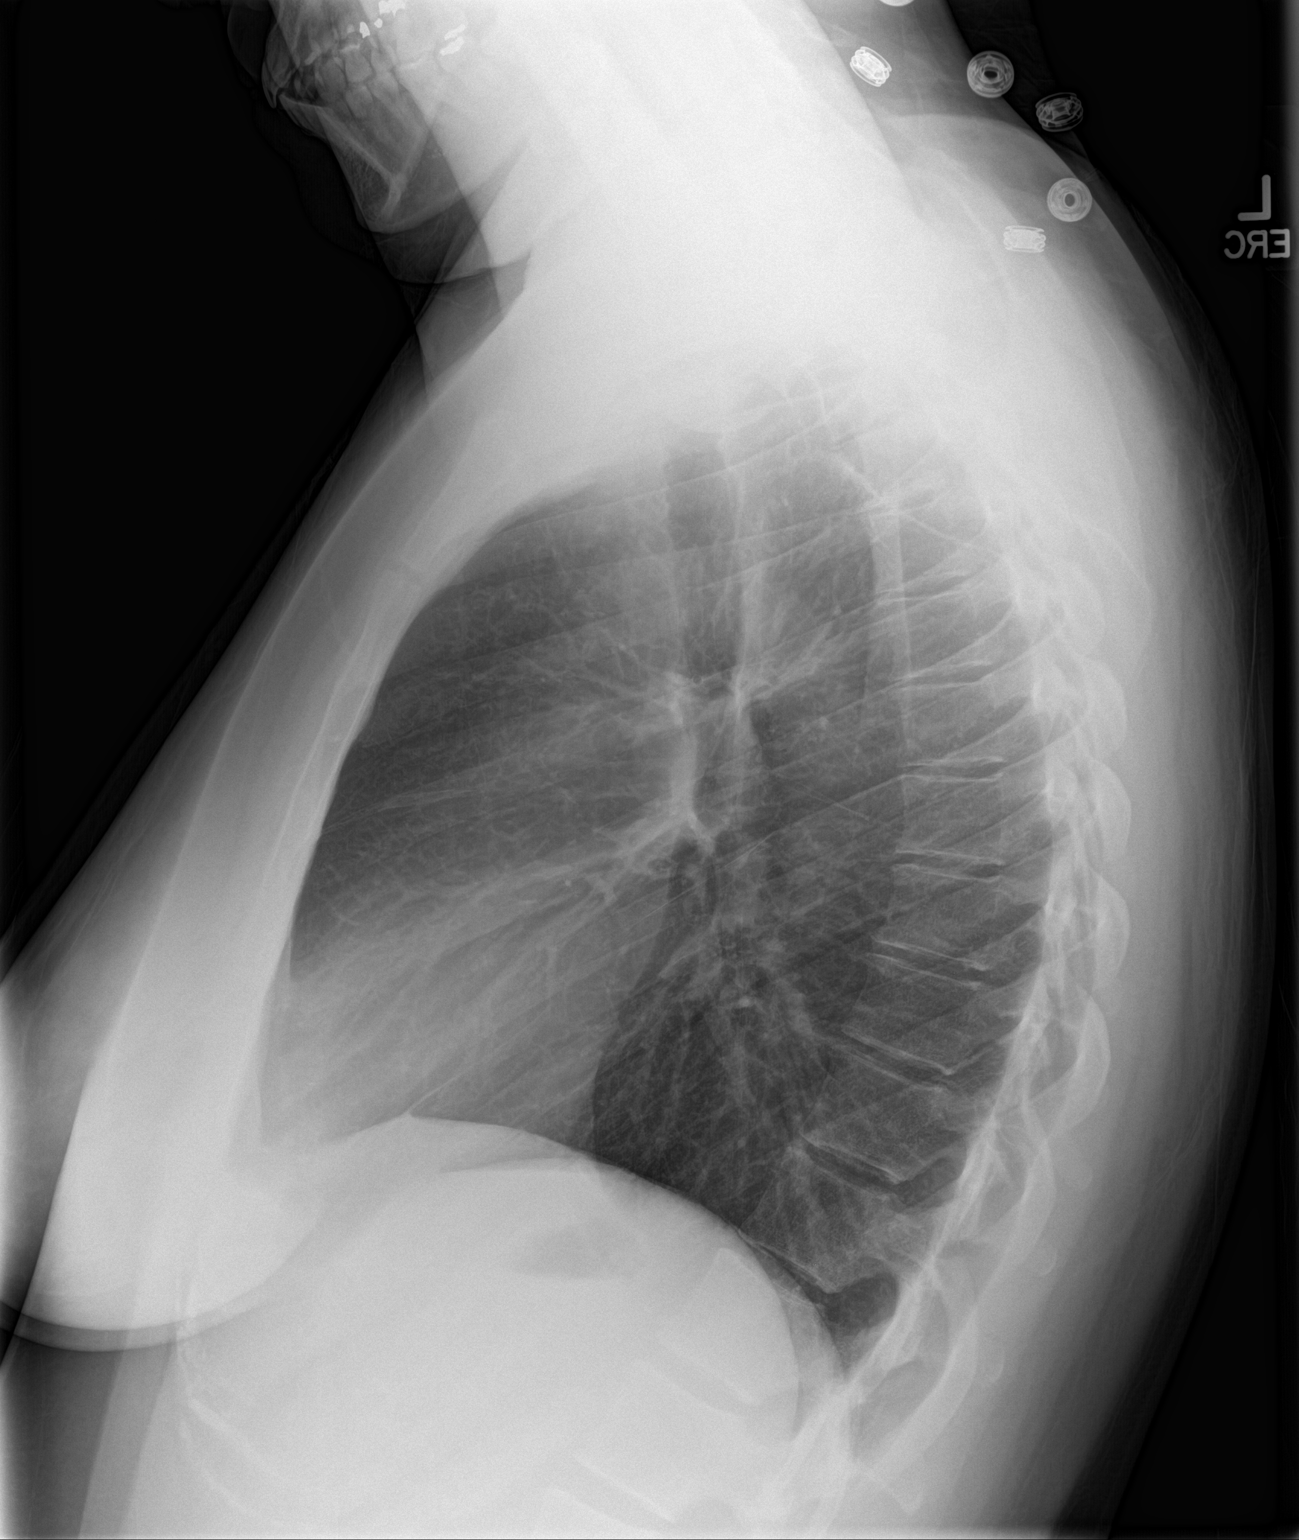

[2 of 2 positions shown; findings below may reference images not displayed]

FINDINGS: The heart size and mediastinal contours are within normal limits.
Both lungs are clear. The visualized skeletal structures are
unremarkable.
IMPRESSION: No active cardiopulmonary disease.  No change from priors.

## 2016-12-09 MED ORDER — OXYTOCIN BOLUS FROM INFUSION
500.0000 mL | Freq: Once | INTRAVENOUS | Status: AC
  Administered 2016-12-09: 500 mL via INTRAVENOUS

## 2016-12-09 MED ORDER — ONDANSETRON HCL 4 MG/2ML IJ SOLN: 4.0000 mg | Freq: Four times a day (QID) | INTRAMUSCULAR | Status: DC | PRN

## 2016-12-09 MED ORDER — HYDRALAZINE HCL 20 MG/ML IJ SOLN
10.0000 mg | Freq: Once | INTRAMUSCULAR | Status: DC | PRN
  Filled 2016-12-09: qty 0.5

## 2016-12-09 MED ORDER — LACTATED RINGERS IV SOLN: 500.0000 mL | INTRAVENOUS | Status: DC | PRN

## 2016-12-09 MED ORDER — ACETAMINOPHEN 325 MG PO TABS: 650.0000 mg | ORAL_TABLET | ORAL | Status: DC | PRN

## 2016-12-09 MED ORDER — LIDOCAINE HCL (PF) 1 % IJ SOLN
INTRAMUSCULAR | Status: AC
  Administered 2016-12-09: 30 mL via SUBCUTANEOUS
  Filled 2016-12-09: qty 30

## 2016-12-09 MED ORDER — OXYTOCIN 40 UNITS IN LACTATED RINGERS INFUSION - SIMPLE MED: 2.5000 [IU]/h | INTRAVENOUS | Status: DC

## 2016-12-09 MED ORDER — FENTANYL 2.5 MCG/ML W/ROPIVACAINE 0.2% IN NS 100 ML EPIDURAL INFUSION (ARMC-ANES)
EPIDURAL | Status: AC
  Filled 2016-12-09: qty 100

## 2016-12-09 MED ORDER — OXYTOCIN 40 UNITS IN LACTATED RINGERS INFUSION - SIMPLE MED
1.0000 m[IU]/min | INTRAVENOUS | Status: DC
  Administered 2016-12-09: 1 m[IU]/min via INTRAVENOUS
  Filled 2016-12-09: qty 1000

## 2016-12-09 MED ORDER — SODIUM CHLORIDE FLUSH 0.9 % IV SOLN
INTRAVENOUS | Status: AC
  Administered 2016-12-09: 11:00:00
  Filled 2016-12-09: qty 20

## 2016-12-09 MED ORDER — AMMONIA AROMATIC IN INHA
RESPIRATORY_TRACT | Status: DC
Start: 2016-12-09 — End: 2016-12-10
  Filled 2016-12-09: qty 10

## 2016-12-09 MED ORDER — OXYTOCIN 10 UNIT/ML IJ SOLN
INTRAMUSCULAR | Status: AC
  Filled 2016-12-09: qty 2

## 2016-12-09 MED ORDER — OXYTOCIN BOLUS FROM INFUSION: 500.0000 mL | Freq: Once | INTRAVENOUS | Status: DC

## 2016-12-09 MED ORDER — LACTATED RINGERS IV SOLN: INTRAVENOUS | Status: DC

## 2016-12-09 MED ORDER — LACTATED RINGERS IV SOLN
500.0000 mL | INTRAVENOUS | Status: DC | PRN
  Administered 2016-12-09: 1000 mL via INTRAVENOUS

## 2016-12-09 MED ORDER — BENZOCAINE-MENTHOL 20-0.5 % EX AERO: 1.0000 "application " | INHALATION_SPRAY | CUTANEOUS | Status: DC | PRN

## 2016-12-09 MED ORDER — IBUPROFEN 600 MG PO TABS
600.0000 mg | ORAL_TABLET | Freq: Four times a day (QID) | ORAL | Status: DC
  Administered 2016-12-09 – 2016-12-11 (×5): 600 mg via ORAL
  Filled 2016-12-09 (×4): qty 1

## 2016-12-09 MED ORDER — LIDOCAINE HCL (PF) 1 % IJ SOLN
30.0000 mL | INTRAMUSCULAR | Status: DC | PRN
  Administered 2016-12-09: 30 mL via SUBCUTANEOUS

## 2016-12-09 MED ORDER — MISOPROSTOL 200 MCG PO TABS
ORAL_TABLET | ORAL | Status: AC
  Filled 2016-12-09: qty 4

## 2016-12-09 MED ORDER — SOD CITRATE-CITRIC ACID 500-334 MG/5ML PO SOLN: 30.0000 mL | ORAL | Status: DC | PRN

## 2016-12-09 MED ORDER — LABETALOL HCL 5 MG/ML IV SOLN
20.0000 mg | INTRAVENOUS | Status: DC | PRN
  Filled 2016-12-09: qty 16

## 2016-12-09 MED ORDER — HYDROMORPHONE HCL 1 MG/ML IJ SOLN
0.5000 mg | Freq: Once | INTRAMUSCULAR | Status: AC
  Administered 2016-12-09: 0.5 mg via INTRAVENOUS
  Filled 2016-12-09: qty 0.5

## 2016-12-09 MED ORDER — OXYTOCIN 40 UNITS IN LACTATED RINGERS INFUSION - SIMPLE MED
1.0000 m[IU]/min | INTRAVENOUS | Status: DC
Start: 2016-12-09 — End: 2016-12-10

## 2016-12-09 MED ORDER — LIDOCAINE HCL (PF) 1 % IJ SOLN: 30.0000 mL | INTRAMUSCULAR | Status: DC | PRN

## 2016-12-09 MED ORDER — LACTATED RINGERS IV SOLN
INTRAVENOUS | Status: DC
  Administered 2016-12-09 (×2): via INTRAVENOUS

## 2016-12-09 MED ORDER — ONDANSETRON HCL 4 MG/2ML IJ SOLN
4.0000 mg | Freq: Four times a day (QID) | INTRAMUSCULAR | Status: DC | PRN
  Administered 2016-12-09: 4 mg via INTRAVENOUS
  Filled 2016-12-09: qty 2

## 2016-12-09 MED ORDER — BUTORPHANOL TARTRATE 1 MG/ML IJ SOLN
1.0000 mg | INTRAMUSCULAR | Status: DC | PRN
  Administered 2016-12-09 (×2): 2 mg via INTRAVENOUS
  Filled 2016-12-09 (×2): qty 2

## 2016-12-09 MED ORDER — SOD CITRATE-CITRIC ACID 500-334 MG/5ML PO SOLN
30.0000 mL | ORAL | Status: DC | PRN
  Filled 2016-12-09: qty 30

## 2016-12-09 MED ORDER — ONDANSETRON 4 MG PO TBDP: 4.0000 mg | ORAL_TABLET | Freq: Four times a day (QID) | ORAL | Status: DC | PRN

## 2016-12-09 NOTE — Progress Notes (Signed)
April Tran is a 26 y.o. G2P1001 at 6354w4d   Subjective:  Feeling ctx strongly, improved with stadol. Declining epidural. No HA. No new swelling. LAbs stable  Objective: BP (!) 147/82 (BP Location: Left Arm)   Pulse 95   Temp 98.2 F (36.8 C) (Axillary)   Resp 18   Ht 5\' 6"  (1.676 m)   Wt 244 lb (110.7 kg)   LMP 03/13/2016 (Approximate)   BMI 39.38 kg/m  No intake/output data recorded. No intake/output data recorded.  FHT:  Mod var, +accels, no decels UC:   none, regular, every 3-4 minutes SVE:   Dilation: 4 Effacement (%): 70 Station: -2 Exam by:: MarriottBeasley  Labs: Lab Results  Component Value Date   WBC 9.3 12/09/2016   HGB 11.0 (L) 12/09/2016   HCT 32.1 (L) 12/09/2016   MCV 80.8 12/09/2016   PLT 134 (L) 12/09/2016    Assessment / Plan: 26yo G2P1001 at 39+4wks, iol for gHTN  On pitocin AROM for clear fluid now Continue induction  April Tran 12/09/2016, 6:59 PM

## 2016-12-09 NOTE — H&P (Signed)
OB ADMISSION/ HISTORY & PHYSICAL:  Admission Date: 12/09/2016  6:13 AM  Admit Diagnosis: Gestational hypertension at term   April Tran is a 26 y.o. female G2P1001 at 39+4wks presenting for nausea/vomiting and contractions. Found to be intermittently contracting with a 3cm cervix, and unable to tolerate po since yesterday.  On review of recent vitals, BP in mild range since last weeks visit to triage. No new onset swelling, RUQ pain, HA. +GERD. Decreased urine output subjectively. No hx of gHTN. However, BP elevated at 17wks, with normal PIH Labs at that time and last week.   Last NSVD in 2014 for 8# baby girl  Prenatal History: G2P1001   EDC : 12/12/2016, Date entered prior to episode creation  Prenatal care at [facility] Provider: Gavin Potters Prenatal course complicated by obesity with normal early glucola and normal 3hr at 28wks with evidence of glucose intolerance (3 h GCT: fast:90 1h:177 2h:139 3H:153 ), depression on zoloft 50mg  daily.  Prenatal Labs: ABO, Rh: --/--/A POS (09/20 1401) Antibody:  neg Rubella:   immune RPR:   non reactive HBsAg:   neg HIV:   nonreactive GTT: 3 h GCT: fast:90 1h:177 2h:139 3H:153  GBS:   negative  Medical / Surgical History :  Past medical history:  Past Medical History:  Diagnosis Date  . Depression      Past surgical history:  Past Surgical History:  Procedure Laterality Date  . TONSILLECTOMY    . WISDOM TOOTH EXTRACTION      Family History:  Family History  Problem Relation Age of Onset  . Hypertension Mother   . Depression Mother   . Hypertension Father   . Diabetes Father   . Depression Father   . Hypertension Maternal Aunt   . Depression Maternal Aunt   . Hypertension Paternal Aunt   . Hypertension Maternal Grandmother   . Depression Maternal Grandmother   . Hypertension Maternal Grandfather   . Depression Maternal Grandfather   . Heart disease Maternal Grandfather   . Hypertension Paternal Grandmother   .  Depression Paternal Grandmother   . Hypertension Paternal Grandfather   . Depression Paternal Grandfather      Social History:  reports that she has never smoked. She has never used smokeless tobacco. She reports that she does not drink alcohol or use drugs.   Allergies: Morphine and related    Current Medications at time of admission:  Prior to Admission medications   Medication Sig Start Date End Date Taking? Authorizing Provider  prenatal vitamin w/FE, FA (PRENATAL 1 + 1) 27-1 MG TABS tablet Take 1 tablet by mouth daily at 12 noon.   Yes Historical Provider, MD  sertraline (ZOLOFT) 25 MG tablet Take 25 mg by mouth daily.   Yes Historical Provider, MD     Review of Systems: Active FM onset of ctx yesterday currently every 9 minutes bloody show none yet   Physical Exam:  VS: Blood pressure (!) 142/90, pulse 100, temperature 98 F (36.7 C), temperature source Oral, resp. rate 18, height 5\' 6"  (1.676 m), weight 244 lb (110.7 kg), last menstrual period 03/13/2016.  General: alert and oriented, appears NAD Heart: RRR Lungs: Clear lung fields Abdomen: Gravid, soft and non-tender, non-distended / Extremities: 1+ edema  Genitalia / VE: Dilation: 3 Effacement (%): 60 Station: -3 Exam by:: sca  FHR: baseline rate 130 / variability mod / accelerations + / no decelerations TOCO: 4-72min  Last [place] 10/18/16 S> D AFI= 175 MM@65 %, EFW= 1978 G @ 52%,  Vertex, Ant Plac, FHR= 138, AF wnl, normal anatomy  Assessment: 39+[redacted] weeks gestation Early first stage of labor FHR category I   Plan:  Admit for inducation of labor for gestational hypertension Labs pending Epidural when desired Continuous fetal monitoring   1. Fetal Well being  - Fetal Tracing: Cat I - Ultrasound:  reviewed, as above - Group B Streptococcus: neg - Presentation: vtx confirmed by leopolds   2. Routine OB: - Prenatal labs reviewed, as above - Rh positive  3. Induction of Labor:  -  Contractions -  external toco in place -  Pelvis proven to 8# -  Plan for induction with pitocin  4. Post Partum Planning: - Infant feeding: breast - Contraception: BTL postpartum

## 2016-12-09 NOTE — Progress Notes (Signed)
April OharaChristian L Tran is a 26 y.o. G2P1001 at 6264w4d, gHTN with stable labs  Feeing contractions, pit at 554mu/min   Objective: BP 108/66   Pulse 85   Temp 98 F (36.7 C) (Oral)   Resp 18   Ht 5\' 6"  (1.676 m)   Wt 244 lb (110.7 kg)   LMP 03/13/2016 (Approximate)   BMI 39.38 kg/m  No intake/output data recorded. No intake/output data recorded.  FHT:  FHR: 135 bpm, variability: moderate,  accelerations:  Present,  decelerations:  Absent UC:   regular, every 3 minutes SVE:   Dilation: 3.5 Effacement (%): 70 Station: -3 Exam by:: J. Grindheim  Labs: Lab Results  Component Value Date   WBC 9.3 12/09/2016   HGB 11.0 (L) 12/09/2016   HCT 32.1 (L) 12/09/2016   MCV 80.8 12/09/2016   PLT 134 (L) 12/09/2016    Assessment / Plan: Induction of labor due to gestational hypertension,  progressing well on pitocin  Labor: Progressing on Pitocin, will continue to increase then AROM Preeclampsia:  labs stable Fetal Wellbeing:  Category I Pain Control:  Labor support without medications I/D:  n/a Anticipated MOD:  NSVD  April Tran 12/09/2016, 1:10 PM

## 2016-12-09 NOTE — Plan of Care (Signed)
Problem: Education: Goal: Knowledge of Childbirth will improve Outcome: Progressing Pt admitted for induction of labor for gestational hypertension. Educated on induction process and disease management, including antihypertensives, necessary blood draws and other evaluation measures to expect.   Problem: Life Cycle: Goal: Ability to make normal progression through stages of labor will improve Outcome: Progressing Pt is currently on Pitocin for labor induction. Educated on pitocin administration and when to expect increased pain.   Problem: Safety: Goal: Chance of risk for complications during delivery will decrease Outcome: Progressing Hourly patient rounding in place.   Problem: Pain Management: Goal: Relief or control of pain from uterine contractions will improve Outcome: Progressing Pt currently using movement and breathing to cope with contractions

## 2016-12-09 NOTE — Progress Notes (Signed)
Spoke with Dr Dalbert GarnetBeasley, made aware of pt rapid labor progression, now 9.5 cm. RN at bedside requesting MD. States she is on her way, will be here in 20 mins. Sharman CrateMaura, primary RN notified.

## 2016-12-09 NOTE — OB Triage Note (Signed)
Pt presenst to L&D with c/o contractions since last night, that eased off and tghen resumed at 0200. Reports fetal movement but less over the last 2 days. Pt reports having membranes stripped in the office Tuesday and was 3 cm. Denies vaginal bleeding or leaking fluid

## 2016-12-09 NOTE — Discharge Summary (Signed)
Obstetrical Discharge Summary  Patient Name: April Tran DOB: 1991/06/13 MRN: [id number]  Date of Admission: 12/09/2016 Date of Discharge: 12/11/16 Primary OB: [facility]  Gestational Age at Delivery: [redacted]w[redacted]d  Antepartum complications: Obesity: BMI 35; depression on zoloft; Admitting Diagnosis: gestational hypertension Secondary Diagnosis: Patient Active Problem List   Diagnosis Date Noted  . Gestational hypertension, third trimester 12/09/2016  . Post-dates pregnancy 12/09/2016  . Pregnancy 12/03/2016  . Indication for care in labor and delivery, antepartum 11/26/2016  . Labor and delivery, indication for care 11/07/2016  . Uterine contractions during pregnancy 10/15/2016  . Fall at home 07/26/2016    Augmentatio n: AROM and Pitocin Intrapartum complications/course: 245OPG2P1001 at 39+4wks admitted for iol for gHTN. Her PreE labs were stable. She was 3cm on admission and progressed on pitocin to 4cm and anterior. AROM for clear fluid. Rapid progression to fully dilated. Pushed over an intact perineum without anesthesia and with min to moderate control. On delivery of the fetal head, shoulder dystocia was dx by turtle sign. Pt consented verbally and timing began. Fetal shoulder was reduced after several McRobert's repositioning, suprapubic pressure and verbal directions to mom. Total time of dystocia approx 60 sec. Cord quickly doubly clamped and cut on perineum and baby passed to awaiting NICU staff. Cord gasses collected.  Placenta delivered with manual extraction. Exam revealed intact cervix, firm tone and 3rd deg lac - 3a. Repaired end to end with 2-0 vicryl. Total length of laceration 5cm.  Total EBL 6060m and resolved with uterine massage. Date of Delivery: 12/09/16 Delivered By: April Tran Delivery Type: spontaneous vaginal delivery Anesthesia: local for repair Placenta: Manually expressed Laceration: 3a Episiotomy: none Newborn Data: Live born  female "[name] Birth Weight: 8 lb 14.9 oz (4050 g) APGAR: 6, 9 PPD#1 she underwent a PPBTL without complication    Discharge Physical Exam: 12/11/16 BP (!) 152/65   Pulse (!) 104   Temp 97.7 F (36.5 C) (Oral)   Resp 16   Ht _0  (1.676 m)   Wt 244 lb (110.7 kg)   LMP 03/13/2016 (Approximate)   BMI 39.38 kg/m   General: NAD CV: RRR Pulm: CTABL, nl effort ABD: s/nd/nt, fundus firm and below the umbilicus Lochia: moderate Incision: c/d/i  DVT Evaluation: LE non-ttp, no evidence of DVT on exam.  Hemoglobin  Date Value Ref Range Status  12/09/2016 11.0 (L) 12.0 - 16.0 g/dL Final   HGB  Date Value Ref Range Status  01/10/2015 14.3 12.0 - 16.0 g/dL Final   HCT  Date Value Ref Range Status  12/09/2016 32.1 (L) 35.0 - 47.0 % Final  01/10/2015 42.7 35.0 - 47.0 % Final   Results for orders placed or performed during the hospital encounter of 12/09/16 (from the past 72 hour(s))  Protein / creatinine ratio, urine     Status: None   Collection Time: 12/09/16  9:27 AM  Result Value Ref Range   Creatinine, Urine 79 mg/dL   Total Protein, Urine <6 mg/dL    Comment: NO NORMAL RANGE ESTABLISHED FOR THIS TEST   Protein Creatinine Ratio        0.00 - 0.15 mg/mg[Cre]    Comment: RESULT BELOW REPORTABLE RANGE, UNABLE TO CALCULATE.   CBC     Status: Abnormal   Collection Time: 12/09/16  9:50 AM  Result Value Ref Range   WBC 9.3 3.6 - 11.0 K/uL   RBC 3.97 3.80 - 5.20 MIL/uL   Hemoglobin 11.0 (L) 12.0 - 16.0  g/dL   HCT 32.1 (L) 35.0 - 47.0 %   MCV 80.8 80.0 - 100.0 fL   MCH 27.8 26.0 - 34.0 pg   MCHC 34.4 32.0 - 36.0 g/dL   RDW 14.8 (H) 11.5 - 14.5 %   Platelets 134 (L) 150 - 440 K/uL  Type and screen [facility]     Status: None   Collection Time: 12/09/16  9:50 AM  Result Value Ref Range   ABO/RH(D) A POS    Antibody Screen NEG    Sample Expiration 12/12/2016   RPR     Status: None   Collection Time: 12/09/16  9:50 AM  Result Value Ref Range   RPR  Ser Ql Non Reactive Non Reactive    Comment: (NOTE) Performed At: [facility] [place], [place] [id number] Lindon Romp MD [id number]   Comprehensive metabolic panel     Status: Abnormal   Collection Time: 12/09/16  9:50 AM  Result Value Ref Range   Sodium 135 135 - 145 mmol/L   Potassium 3.4 (L) 3.5 - 5.1 mmol/L   Chloride 104 101 - 111 mmol/L   CO2 22 22 - 32 mmol/L   Glucose, Bld 96 65 - 99 mg/dL   BUN 7 6 - 20 mg/dL   Creatinine, Ser 0.68 0.44 - 1.00 mg/dL   Calcium 8.8 (L) 8.9 - 10.3 mg/dL   Total Protein 7.0 6.5 - 8.1 g/dL   Albumin 3.0 (L) 3.5 - 5.0 g/dL   AST 25 15 - 41 U/L   ALT 15 14 - 54 U/L   Alkaline Phosphatase 175 (H) 38 - 126 U/L   Total Bilirubin 0.7 0.3 - 1.2 mg/dL   GFR calc non Af Amer >60 >60 mL/min   GFR calc Af Amer >60 >60 mL/min    Comment: (NOTE) The eGFR has been calculated using the CKD EPI equation. This calculation has not been validated in all clinical situations. eGFR's persistently <60 mL/min signify possible Chronic Kidney Disease.    Anion gap 9 5 - 15  Rapid HIV screen (HIV 1/2 Ab+Ag) ([facility] Only)     Status: None   Collection Time: 12/09/16  9:50 AM  Result Value Ref Range   HIV-1 P24 Antigen - HIV24 NON REACTIVE NON REACTIVE   HIV 1/2 Antibodies NON REACTIVE NON REACTIVE   Interpretation (HIV Ag Ab)      A non reactive test result means that HIV 1 or HIV 2 antibodies and HIV 1 p24 antigen were not detected in the specimen.  CBC     Status: Abnormal   Collection Time: 12/10/16  5:10 AM  Result Value Ref Range   WBC 13.0 (H) 3.6 - 11.0 K/uL   RBC 3.76 (L) 3.80 - 5.20 MIL/uL   Hemoglobin 10.4 (L) 12.0 - 16.0 g/dL   HCT 30.6 (L) 35.0 - 47.0 %   MCV 81.4 80.0 - 100.0 fL   MCH 27.6 26.0 - 34.0 pg   MCHC 33.9 32.0 - 36.0 g/dL   RDW 14.6 (H) 11.5 - 14.5 %   Platelets 114 (L) 150 - 440 K/uL    Comment: COUNT MAY BE INACCURATE DUE TO FIBRIN CLUMPS.   Post partum course: PPBTL performed , no problem with 3rd  degree laceration Postpartum Procedures: as above Disposition: stable, discharge to home. Baby Feeding: breastmilk Baby Disposition: home with mom  Rh Immune globulin given:n/a Rubella vaccine given: n/a Tdap vaccine given in AP or PP setting: 10/07/16  Flu vaccine given in AP or PP setting: 10/22/16  Contraception: BTL  Prenatal Labs:  MBT A+ Ab screen Neg Pap HIV Neg Hep B/RPR Neg/NR  Rubella Immune VZV Immune,    Plan:  April Tran was discharged to home in good condition. Follow-up appointment at [facility] weeks, Dr Leonides Schanz for wound check    Discharge Medications: Allergies as of 12/09/2016      Reactions   Morphine And Related Hypertension    Norco 1po q 6 hr prn  Colace 100 mg BID  Metamucil  Motrin 600 mg q 8 hrs prn      Signed: Boykin Nearing, MD 12/11/16

## 2016-12-09 NOTE — Progress Notes (Signed)
Rita OharaChristian L [place] is a 26 y.o. G2P1001 at 6563w4d   Subjective: Feeling ctx strongly   Objective: BP (!) 144/85   Pulse 89   Temp 97.9 F (36.6 C) (Oral)   Resp 20   Ht 5\' 6"  (1.676 m)   Wt 244 lb (110.7 kg)   LMP 03/13/2016 (Approximate)   BMI 39.38 kg/m  No intake/output data recorded. No intake/output data recorded.  FHT:  FHR: 130 bpm, variability: moderate,  accelerations:  Present,  decelerations:  Absent UC:   regular, every 3 minutes SVE:   Dilation: 4 Effacement (%): 70 Station: -2 Exam by:: J. Grindheim  Labs: Lab Results  Component Value Date   WBC 9.3 12/09/2016   HGB 11.0 (L) 12/09/2016   HCT 32.1 (L) 12/09/2016   MCV 80.8 12/09/2016   PLT 134 (L) 12/09/2016    Assessment / Plan: Induction of labor due to gestational hypertension,  progressing well on pitocin Will AROM when clinically appropriate Stadol as needed, epidural if interested  Christeen DouglasBEASLEY, Billijo Dilling 12/09/2016, 3:53 PM

## 2016-12-10 ENCOUNTER — Inpatient Hospital Stay: Payer: Medicaid Other | Admitting: Anesthesiology

## 2016-12-10 ENCOUNTER — Encounter: Payer: Self-pay | Admitting: *Deleted

## 2016-12-10 ENCOUNTER — Encounter
Admission: EM | Disposition: A | Discharge status: Home / Self Care | Payer: Self-pay | Source: Home / Self Care | Attending: Obstetrics and Gynecology

## 2016-12-10 HISTORY — PX: TUBAL LIGATION: SHX77

## 2016-12-10 LAB — CBC
HCT: 30.6 % — ABNORMAL LOW (ref 35.0–47.0)
Hemoglobin: 10.4 g/dL — ABNORMAL LOW (ref 12.0–16.0)
MCH: 27.6 pg (ref 26.0–34.0)
MCHC: 33.9 g/dL (ref 32.0–36.0)
MCV: 81.4 fL (ref 80.0–100.0)
PLATELETS: 114 10*3/uL — AB (ref 150–440)
RBC: 3.76 MIL/uL — ABNORMAL LOW (ref 3.80–5.20)
RDW: 14.6 % — AB (ref 11.5–14.5)
WBC: 13 10*3/uL — ABNORMAL HIGH (ref 3.6–11.0)

## 2016-12-10 LAB — RPR: RPR: NONREACTIVE

## 2016-12-10 SURGERY — LIGATION, FALLOPIAN TUBE, BILATERAL
Anesthesia: General | Laterality: Bilateral | Wound class: Clean

## 2016-12-10 MED ORDER — SODIUM CHLORIDE 0.9% FLUSH: 3.0000 mL | Freq: Two times a day (BID) | INTRAVENOUS | Status: DC

## 2016-12-10 MED ORDER — LIDOCAINE HCL (CARDIAC) 20 MG/ML IV SOLN
INTRAVENOUS | Status: DC | PRN
  Administered 2016-12-10: 100 mg via INTRAVENOUS

## 2016-12-10 MED ORDER — SODIUM CHLORIDE 0.9 % IV SOLN: 250.0000 mL | INTRAVENOUS | Status: DC | PRN

## 2016-12-10 MED ORDER — BUPIVACAINE HCL (PF) 0.5 % IJ SOLN
INTRAMUSCULAR | Status: AC
  Filled 2016-12-10: qty 30

## 2016-12-10 MED ORDER — ONDANSETRON HCL 4 MG/2ML IJ SOLN
INTRAMUSCULAR | Status: DC | PRN
  Administered 2016-12-10: 4 mg via INTRAVENOUS

## 2016-12-10 MED ORDER — SODIUM CHLORIDE 0.9% FLUSH: 3.0000 mL | INTRAVENOUS | Status: DC | PRN

## 2016-12-10 MED ORDER — TETANUS-DIPHTH-ACELL PERTUSSIS 5-2.5-18.5 LF-MCG/0.5 IM SUSP: 0.5000 mL | Freq: Once | INTRAMUSCULAR | Status: DC

## 2016-12-10 MED ORDER — ACETAMINOPHEN 325 MG PO TABS
650.0000 mg | ORAL_TABLET | ORAL | Status: DC | PRN
  Administered 2016-12-10: 650 mg via ORAL
  Filled 2016-12-10: qty 2

## 2016-12-10 MED ORDER — ONDANSETRON HCL 4 MG PO TABS: 4.0000 mg | ORAL_TABLET | ORAL | Status: DC | PRN

## 2016-12-10 MED ORDER — FENTANYL CITRATE (PF) 100 MCG/2ML IJ SOLN
INTRAMUSCULAR | Status: AC
  Administered 2016-12-10: 25 ug via INTRAVENOUS
  Filled 2016-12-10: qty 2

## 2016-12-10 MED ORDER — SENNOSIDES-DOCUSATE SODIUM 8.6-50 MG PO TABS
2.0000 | ORAL_TABLET | ORAL | Status: DC
  Administered 2016-12-11: 2 via ORAL
  Filled 2016-12-10: qty 2

## 2016-12-10 MED ORDER — SERTRALINE HCL 25 MG PO TABS
25.0000 mg | ORAL_TABLET | Freq: Every day | ORAL | Status: DC
  Administered 2016-12-10 – 2016-12-11 (×2): 25 mg via ORAL
  Filled 2016-12-10 (×2): qty 1

## 2016-12-10 MED ORDER — ONDANSETRON HCL 4 MG/2ML IJ SOLN: 4.0000 mg | Freq: Once | INTRAMUSCULAR | Status: DC | PRN

## 2016-12-10 MED ORDER — FENTANYL CITRATE (PF) 100 MCG/2ML IJ SOLN
25.0000 ug | INTRAMUSCULAR | Status: DC | PRN
  Administered 2016-12-10 (×2): 25 ug via INTRAVENOUS

## 2016-12-10 MED ORDER — DIPHENHYDRAMINE HCL 25 MG PO CAPS: 25.0000 mg | ORAL_CAPSULE | Freq: Four times a day (QID) | ORAL | Status: DC | PRN

## 2016-12-10 MED ORDER — ACETAMINOPHEN 10 MG/ML IV SOLN
INTRAVENOUS | Status: DC | PRN
  Administered 2016-12-10: 1000 mg via INTRAVENOUS

## 2016-12-10 MED ORDER — ZOLPIDEM TARTRATE 5 MG PO TABS: 5.0000 mg | ORAL_TABLET | Freq: Every evening | ORAL | Status: DC | PRN

## 2016-12-10 MED ORDER — DIBUCAINE 1 % RE OINT: 1.0000 "application " | TOPICAL_OINTMENT | RECTAL | Status: DC | PRN

## 2016-12-10 MED ORDER — BENZOCAINE-MENTHOL 20-0.5 % EX AERO
INHALATION_SPRAY | CUTANEOUS | Status: AC
  Administered 2016-12-10
  Filled 2016-12-10: qty 56

## 2016-12-10 MED ORDER — ONDANSETRON HCL 4 MG/2ML IJ SOLN: 4.0000 mg | INTRAMUSCULAR | Status: DC | PRN

## 2016-12-10 MED ORDER — KETOROLAC TROMETHAMINE 30 MG/ML IJ SOLN
INTRAMUSCULAR | Status: DC | PRN
  Administered 2016-12-10: 30 mg via INTRAVENOUS

## 2016-12-10 MED ORDER — BISACODYL 10 MG RE SUPP: 10.0000 mg | Freq: Every day | RECTAL | Status: DC | PRN

## 2016-12-10 MED ORDER — PROPOFOL 10 MG/ML IV BOLUS
INTRAVENOUS | Status: DC | PRN
  Administered 2016-12-10: 200 mg via INTRAVENOUS

## 2016-12-10 MED ORDER — MIDAZOLAM HCL 2 MG/2ML IJ SOLN
INTRAMUSCULAR | Status: DC | PRN
  Administered 2016-12-10: 2 mg via INTRAVENOUS

## 2016-12-10 MED ORDER — FENTANYL CITRATE (PF) 100 MCG/2ML IJ SOLN
INTRAMUSCULAR | Status: DC | PRN
  Administered 2016-12-10 (×2): 100 ug via INTRAVENOUS

## 2016-12-10 MED ORDER — SENNOSIDES-DOCUSATE SODIUM 8.6-50 MG PO TABS: 2.0000 | ORAL_TABLET | ORAL | Status: DC

## 2016-12-10 MED ORDER — MEASLES, MUMPS & RUBELLA VAC ~~LOC~~ INJ
0.5000 mL | INJECTION | Freq: Once | SUBCUTANEOUS | Status: DC
  Filled 2016-12-10: qty 0.5

## 2016-12-10 MED ORDER — FLEET ENEMA 7-19 GM/118ML RE ENEM: 1.0000 | ENEMA | Freq: Every day | RECTAL | Status: DC | PRN

## 2016-12-10 MED ORDER — DEXAMETHASONE SODIUM PHOSPHATE 10 MG/ML IJ SOLN
INTRAMUSCULAR | Status: DC | PRN
  Administered 2016-12-10: 6 mg via INTRAVENOUS

## 2016-12-10 MED ORDER — OXYCODONE HCL 5 MG PO TABS
5.0000 mg | ORAL_TABLET | ORAL | Status: DC | PRN
  Administered 2016-12-10 – 2016-12-11 (×6): 5 mg via ORAL
  Filled 2016-12-10 (×6): qty 1

## 2016-12-10 MED ORDER — SIMETHICONE 80 MG PO CHEW: 80.0000 mg | CHEWABLE_TABLET | ORAL | Status: DC | PRN

## 2016-12-10 MED ORDER — LACTATED RINGERS IV SOLN
INTRAVENOUS | Status: DC
  Administered 2016-12-10 (×2): via INTRAVENOUS

## 2016-12-10 MED ORDER — WITCH HAZEL-GLYCERIN EX PADS: 1.0000 "application " | MEDICATED_PAD | CUTANEOUS | Status: DC | PRN

## 2016-12-10 MED ORDER — IBUPROFEN 600 MG PO TABS
ORAL_TABLET | ORAL | Status: AC
  Administered 2016-12-09: 600 mg via ORAL
  Filled 2016-12-10: qty 1

## 2016-12-10 MED ORDER — SUCCINYLCHOLINE CHLORIDE 20 MG/ML IJ SOLN
INTRAMUSCULAR | Status: DC | PRN
  Administered 2016-12-10: 100 mg via INTRAVENOUS

## 2016-12-10 MED ORDER — PRENATAL MULTIVITAMIN CH
1.0000 | ORAL_TABLET | Freq: Every day | ORAL | Status: DC
  Administered 2016-12-10: 1 via ORAL
  Filled 2016-12-10: qty 1

## 2016-12-10 MED ORDER — EPHEDRINE SULFATE 50 MG/ML IJ SOLN
INTRAMUSCULAR | Status: DC | PRN
Start: 2016-12-10 — End: 2016-12-10
  Administered 2016-12-10: 10 mg via INTRAVENOUS

## 2016-12-10 MED ORDER — COCONUT OIL OIL: 1.0000 "application " | TOPICAL_OIL | Status: DC | PRN

## 2016-12-10 SURGICAL SUPPLY — 30 items
BLADE SURG SZ11 CARB STEEL (BLADE) ×3 IMPLANT
CATH TRAY METER 16FR LF (MISCELLANEOUS) ×3 IMPLANT
CHLORAPREP W/TINT 26ML (MISCELLANEOUS) ×3 IMPLANT
DRAPE LAPAROTOMY 100X77 ABD (DRAPES) ×3 IMPLANT
DRSG TEGADERM 2-3/8X2-3/4 SM (GAUZE/BANDAGES/DRESSINGS) ×3 IMPLANT
DRSG TELFA 4X3 1S NADH ST (GAUZE/BANDAGES/DRESSINGS) ×3 IMPLANT
ELECT CAUTERY BLADE 6.4 (BLADE) ×3 IMPLANT
ELECT REM PT RETURN 9FT ADLT (ELECTROSURGICAL) ×3
ELECTRODE REM PT RTRN 9FT ADLT (ELECTROSURGICAL) ×1 IMPLANT
GLOVE PI ORTHOPRO 6.5 (GLOVE) ×2
GLOVE PI ORTHOPRO STRL 6.5 (GLOVE) ×1 IMPLANT
GLOVE SURG SYN 6.5 ES PF (GLOVE) ×3 IMPLANT
GLOVE SURG SYN 6.5 PF PI (GLOVE) ×1 IMPLANT
GOWN STRL REUS W/ TWL LRG LVL3 (GOWN DISPOSABLE) ×2 IMPLANT
GOWN STRL REUS W/TWL LRG LVL3 (GOWN DISPOSABLE) ×6
KIT RM TURNOVER CYSTO AR (KITS) ×3 IMPLANT
LABEL OR SOLS (LABEL) ×3 IMPLANT
LIGASURE BLUNT 5MM 37CM (INSTRUMENTS) IMPLANT
LIQUID BAND (GAUZE/BANDAGES/DRESSINGS) ×3 IMPLANT
NDL SAFETY 22GX1.5 (NEEDLE) ×3 IMPLANT
NS IRRIG 500ML POUR BTL (IV SOLUTION) ×3 IMPLANT
PACK BASIN MINOR ARMC (MISCELLANEOUS) ×3 IMPLANT
RETRACTOR WOUND ALXS 18CM SML (MISCELLANEOUS) ×1 IMPLANT
RTRCTR WOUND ALEXIS O 18CM SML (MISCELLANEOUS) ×3
SPONGE LAP 18X18 5 PK (GAUZE/BANDAGES/DRESSINGS) ×5 IMPLANT
SUT MNCRL AB 4-0 PS2 18 (SUTURE) ×3 IMPLANT
SUT VIC AB 2-0 CT1 27 (SUTURE) ×6
SUT VIC AB 2-0 CT1 TAPERPNT 27 (SUTURE) ×2 IMPLANT
SUT VIC AB 2-0 UR6 27 (SUTURE) ×3 IMPLANT
SYRINGE 10CC LL (SYRINGE) ×3 IMPLANT

## 2016-12-10 NOTE — Op Note (Addendum)
  Post Partum Tubal Ligation  Indication for procedure: desired permanent sterilization  Pre-op diagnosis: s/p term vaginal delivery, desired permanent sterilization Post-op diagnosis: same Procedure: post partum bilateral tubal ligation Surgeon: Harshita Bernales Assist: none Anesthesia: general  IVF: 600cc EBL: minimal UOP: 0 Findings: patent bilateral tubes, normal post-gravid uterine fundus Specimens: portion of left tube, portion of right tube Complications: none apparent Disposition: stable to PACU  Procedure in detail: The patient was seen and confirmed desire for permanent sterilization.  She was identified as April Tran and was brought to the OR.  General anesthesia was administered, and the patient was prepped and draped in the usual sterile fashion.  A surgical time-out was called.  An 10-blade was used to incise the skin in the infraumbilical area.  The subcutaneous tissues were dissected to and then through the fascia, and the peritoneum was grasped and divided.  An alexis retractor was placed in the abdominal cavity and positioned.  The uterus was identified, and the right tube was grasped with a babcock and traced to the fimbriated end.  The tube was grasped in the mid-isthmus, and the mesosalpinx was divided with the bovie.  Suture was used to tie the proximal and distal ends in a [place] fashion. The midportion of the tube was then transected at either end and handed off to nursing. The same steps were used on the left side.  All dissected ends were found to be hemostatic.  The alexis retractor was removed, and the fascia was reapproximated with 1 vicryl.  The subcutaneous tissue was irrigated and then the skin was closed with 4-0 monocryl.  The skin was covered in surgical glue.  The sponge, needle, and instrument counts were correct x2.  The patient tolerated the procedure and was brought to PACU in a stable condition.  Ranae Plumber, MD Attending Obstetrician and  Gynecologist [facility]

## 2016-12-10 NOTE — Lactation Note (Signed)
This note was copied from a baby's chart. Lactation Consultation Note  Patient Name: April Tran Today's Date: 12/10/2016     Maternal Data  Mom had BTL today, doesn't want to put baby to breast, wants to pump and bottlefeed EBM only, pumps 5-7 cc, pumping q3hr, has "[employer]" pump and may have an electric pump at home, not sure of name, she is using hospital [facility] pump now, has tried her pump and is dissatisfied with efficiency of "[employer]" pump, She does have WIC and I informed her that she may be able to obtain loaner electric pump through [facility], she plans on going to [facility] on Monday April 2nd.  I also informed her that we sell breast pumps and also rent pumps.  She will talk with her husband about these options, she is aware that she must pump q 3h to attain and maintain a milk supply. Feeding Feeding Type: Formula Nipple Type: Slow - flow  LATCH Score/Interventions                      Lactation Tools Discussed/Used     Consult Status      Dyann Kief 12/10/2016, 4:47 PM

## 2016-12-10 NOTE — Anesthesia Preprocedure Evaluation (Signed)
Anesthesia Evaluation  Patient identified by MRN, date of birth, ID band Patient awake    Reviewed: Allergy & Precautions, NPO status , Patient's Chart, lab work & pertinent test results, reviewed documented beta blocker date and time   Airway Mallampati: III  TM Distance: >3 FB     Dental  (+) Chipped   Pulmonary           Cardiovascular hypertension,      Neuro/Psych PSYCHIATRIC DISORDERS Depression    GI/Hepatic   Endo/Other    Renal/GU      Musculoskeletal   Abdominal   Peds  Hematology   Anesthesia Other Findings Obese.  Reproductive/Obstetrics                             Anesthesia Physical Anesthesia Plan  ASA: III  Anesthesia Plan: General   Post-op Pain Management:    Induction: Intravenous  Airway Management Planned: Oral ETT  Additional Equipment:   Intra-op Plan:   Post-operative Plan:   Informed Consent: I have reviewed the patients History and Physical, chart, labs and discussed the procedure including the risks, benefits and alternatives for the proposed anesthesia with the patient or authorized representative who has indicated his/her understanding and acceptance.     Plan Discussed with: CRNA  Anesthesia Plan Comments:         Anesthesia Quick Evaluation

## 2016-12-10 NOTE — Anesthesia Postprocedure Evaluation (Signed)
Anesthesia Post Note  Patient: Yaeko L [place]  Procedure(s) Performed: Procedure(s) (LRB): BILATERAL TUBAL LIGATION (Bilateral)  Patient location during evaluation: PACU Anesthesia Type: General Level of consciousness: awake and alert Pain management: pain level controlled Vital Signs Assessment: post-procedure vital signs reviewed and stable Respiratory status: spontaneous breathing, nonlabored ventilation, respiratory function stable and patient connected to nasal cannula oxygen Cardiovascular status: blood pressure returned to baseline and stable Postop Assessment: no signs of nausea or vomiting Anesthetic complications: no     Last Vitals:  Vitals:   12/10/16 1355 12/10/16 1415  BP: 129/83 127/80  Pulse: 93 92  Resp: 11 18  Temp: 36.6 C 37 C    Last Pain:  Vitals:   12/10/16 1447  TempSrc:   PainSc: 5                  Alessio Bogan S

## 2016-12-10 NOTE — Transfer of Care (Signed)
Immediate Anesthesia Transfer of Care Note  Patient: April Tran  Procedure(s) Performed: Procedure(s): BILATERAL TUBAL LIGATION (Bilateral)  Patient Location: PACU  Anesthesia Type:General  Level of Consciousness: patient cooperative and responds to stimulation  Airway & Oxygen Therapy: Patient Spontanous Breathing and Patient connected to nasal cannula oxygen  Post-op Assessment: Report given to RN and Post -op Vital signs reviewed and stable  Post vital signs: Reviewed and stable  Last Vitals:  Vitals:   12/10/16 1019 12/10/16 1255  BP:  113/75  Pulse:  (!) 104  Resp:  10  Temp: 36.3 C 36.7 C    Last Pain:  Vitals:   12/10/16 1255  TempSrc: Temporal  PainSc: Asleep         Complications: No apparent anesthesia complications

## 2016-12-10 NOTE — Anesthesia Post-op Follow-up Note (Cosign Needed)
Anesthesia QCDR form completed.        

## 2016-12-10 NOTE — Progress Notes (Signed)
Post Partum Day 1 Subjective: Doing well, no complaints.  Tolerating pain with PO meds, voiding and ambulating without difficulty.  No CP SOB F/C N/V or leg pain no HA change of vision, RUQ/epigastric pain  Objective: BP (!) 141/77   Pulse 84   Temp 97.3 F (36.3 C)   Resp 18   Ht  (1.676 m)   Wt 110.7 kg (244 lb)   LMP 03/13/2016 (Approximate)   SpO2 100%   Breastfeeding? Unknown Comment: pumping  BMI 39.38 kg/m    Physical Exam:  General: NAD CV: RRR Pulm: nl effort, CTABL Lochia: moderate Uterine Fundus: fundus firm and below umbilicus DVT Evaluation: no cords, ttp LEs    Recent Labs  12/09/16 0950 12/10/16 0510  HGB 11.0* 10.4*  HCT 32.1* 30.6*  WBC 9.3 13.0*  PLT 134* 114*    Assessment/Plan: 26 y.o. G2P2002 postpartum day # 1  1. I discussed in no uncertain terms with the patient about whether she truly desires her tubes to be permanently sterilized.  I gave her grave scenario: her husband and two children are killed in a car crash and she meets someone in 10 years and wants to begin a new family.  I informed her of the various options for long-term birth control, and that the decision for tubal ligation would always be an option for her.  She discussed this with her husband and decided to proceed with surgery today.  2. Post op/ post partum cares.    ----- Ranae Plumber, MD Attending Obstetrician and Gynecologist [facility]

## 2016-12-10 NOTE — Anesthesia Procedure Notes (Addendum)
Procedure Name: Intubation Date/Time: 12/10/2016 11:41 AM Performed by: Darlyne Russian Pre-anesthesia Checklist: Patient identified, Emergency Drugs available, Suction available, Patient being monitored and Timeout performed Patient Re-evaluated:Patient Re-evaluated prior to inductionOxygen Delivery Method: Circle system utilized Preoxygenation: Pre-oxygenation with 100% oxygen Intubation Type: IV induction, Rapid sequence and Cricoid Pressure applied Laryngoscope Size: Mac and 3 Grade View: Grade II Tube size: 7.0 mm Number of attempts: 1 Airway Equipment and Method: Stylet Placement Confirmation: ETT inserted through vocal cords under direct vision,  positive ETCO2 and breath sounds checked- equal and bilateral Secured at: 21 cm Tube secured with: Tape Dental Injury: Teeth and Oropharynx as per pre-operative assessment  Comments: Lips dry/cracked, lips not cut but noted to have some bleeding at cracked sites on lips.  Lubrifresh Ointment applied to lips.

## 2016-12-11 MED ORDER — HYDROCODONE-ACETAMINOPHEN 5-325 MG PO TABS: 1.0000 | ORAL_TABLET | Freq: Four times a day (QID) | ORAL | 0 refills | Status: DC | PRN

## 2016-12-11 MED ORDER — DOCUSATE SODIUM 100 MG PO CAPS: 100.0000 mg | ORAL_CAPSULE | Freq: Two times a day (BID) | ORAL | 0 refills | Status: DC

## 2016-12-11 MED ORDER — BENZOCAINE-MENTHOL 20-0.5 % EX AERO: 1.0000 "application " | INHALATION_SPRAY | CUTANEOUS | 1 refills | Status: DC | PRN

## 2016-12-11 MED ORDER — IBUPROFEN 600 MG PO TABS: 600.0000 mg | ORAL_TABLET | Freq: Four times a day (QID) | ORAL | 0 refills | Status: DC

## 2016-12-11 NOTE — Progress Notes (Signed)
D/C home to car via auxiliary in wheelchair.  

## 2016-12-11 NOTE — Progress Notes (Signed)
D/C instructions provided, pt states understanding, aware of follow up appt.  Prescription given to pt.   

## 2016-12-13 LAB — SURGICAL PATHOLOGY

## 2017-04-04 ENCOUNTER — Encounter: Payer: Self-pay | Admitting: Emergency Medicine

## 2017-04-04 ENCOUNTER — Emergency Department
Admission: EM | Admit: 2017-04-04 | Discharge: 2017-04-04 | Disposition: A | Discharge status: Home / Self Care | Payer: Medicaid Other | Attending: Emergency Medicine | Admitting: Emergency Medicine

## 2017-04-04 DIAGNOSIS — J069 Acute upper respiratory infection, unspecified: Secondary | ICD-10-CM

## 2017-04-04 DIAGNOSIS — Z79899 Other long term (current) drug therapy: Secondary | ICD-10-CM | POA: Insufficient documentation

## 2017-04-04 DIAGNOSIS — Z791 Long term (current) use of non-steroidal anti-inflammatories (NSAID): Secondary | ICD-10-CM | POA: Insufficient documentation

## 2017-04-04 MED ORDER — FLUTICASONE PROPIONATE 50 MCG/ACT NA SUSP: 2.0000 | Freq: Every day | NASAL | 0 refills | Status: DC

## 2017-04-04 NOTE — ED Triage Notes (Signed)
Pt c/o nasal congestion and drainage for 3-4 days.  Pain to maxillary and frontal sinuses.  Ambulatory to triage without difficulty.

## 2017-04-04 NOTE — Discharge Instructions (Signed)
Obtain Sudafed PE over-the-counter for nasal congestion. Begin using Flonase nasal spray. Increase fluids. [facility] if any continued problems.

## 2017-04-04 NOTE — ED Notes (Signed)
See triage note  States she developed nasal congestion and green nasal drainage about 1 week ago   Unsure of fever

## 2017-04-04 NOTE — ED Provider Notes (Signed)
[facility] Emergency Department Provider Note  ____________________________________________   First MD Initiated Contact with Patient 04/04/17 1008     (approximate)  I have reviewed the triage vital signs and the nursing notes.   HISTORY  Chief Complaint Nasal Congestion    HPI April Tran is a 26 y.o. female is complaint of nasal congestion, cough, sneezing, watery eyes for approximately 3 days. Patient denies any fever or chills. She has been taking Mucinex without relief. She is also here with her 3 month child with same symptoms.She rates her pain as 6 out of 10.   Past Medical History:  Diagnosis Date  . Depression     Patient Active Problem List   Diagnosis Date Noted  . Gestational hypertension, third trimester 12/09/2016  . Post-dates pregnancy 12/09/2016  . Pregnancy 12/03/2016  . Indication for care in labor and delivery, antepartum 11/26/2016  . Labor and delivery, indication for care 11/07/2016  . Uterine contractions during pregnancy 10/15/2016  . Fall at home 07/26/2016    Past Surgical History:  Procedure Laterality Date  . TONSILLECTOMY    . TUBAL LIGATION Bilateral 12/10/2016   Procedure: BILATERAL TUBAL LIGATION;  Surgeon: Elenora Fender Ward, MD;  Location: [facility] ORS;  Service: Gynecology;  Laterality: Bilateral;  . WISDOM TOOTH EXTRACTION      Prior to Admission medications   Medication Sig Start Date End Date Taking? Authorizing Provider  fluticasone (FLONASE) 50 MCG/ACT nasal spray Place 2 sprays into both nostrils daily. 04/04/17 04/04/18  Tommi Rumps, PA-C  ibuprofen (ADVIL,MOTRIN) 600 MG tablet Take 1 tablet (600 mg total) by mouth every 6 (six) hours. 12/11/16   Schermerhorn, Ihor Austin, MD  sertraline (ZOLOFT) 25 MG tablet Take 25 mg by mouth daily.    [provider]    Allergies Morphine and related  Family History  Problem Relation Age of Onset  . Hypertension Mother   . Depression Mother     . Hypertension Father   . Diabetes Father   . Depression Father   . Hypertension Maternal Aunt   . Depression Maternal Aunt   . Hypertension Paternal Aunt   . Hypertension Maternal Grandmother   . Depression Maternal Grandmother   . Hypertension Maternal Grandfather   . Depression Maternal Grandfather   . Heart disease Maternal Grandfather   . Hypertension Paternal Grandmother   . Depression Paternal Grandmother   . Hypertension Paternal Grandfather   . Depression Paternal Grandfather     Social History Social History  Substance Use Topics  . Smoking status: Never Smoker  . Smokeless tobacco: Never Used  . Alcohol use No    Review of Systems Constitutional: No fever/chills Eyes: No visual changes. ENT: Positive nasal congestion. Cardiovascular: Denies chest pain. Respiratory: Denies shortness of breath. Positive nonproductive cough. Gastrointestinal: No abdominal pain.  No nausea, no vomiting.   Skin: Negative for rash. Neurological: Negative for headaches, focal weakness or numbness.   ____________________________________________   PHYSICAL EXAM:  VITAL SIGNS: ED Triage Vitals  Enc Vitals Group     BP 04/04/17 0929 119/80     Pulse Rate 04/04/17 0929 90     Resp 04/04/17 0929 18     Temp 04/04/17 0929 98.7 F (37.1 C)     Temp Source 04/04/17 0929 Oral     SpO2 04/04/17 0929 100 %     Weight 04/04/17 0926 220 lb (99.8 kg)     Height 04/04/17 0926 5\' 6"  (1.676 m)  Head Circumference --      Peak Flow --      Pain Score 04/04/17 0926 6     Pain Loc --      Pain Edu? --      Excl. in GC? --     Constitutional: Alert and oriented. Well appearing and in no acute distress. Eyes: Conjunctivae are normal. PERRL. EOMI. ENT: Moderate nasal congestion without rhinorrhea.  Right frontal and maxillary sinuses tender to percussion. EACs and TMs are clear bilaterally. Mouth/Throat: Mucous membranes are moist.  Oropharynx non-erythematous. Mild posterior  drainage. Neck: No stridor.   Hematological/Lymphatic/Immunilogical: No cervical lymphadenopathy. Cardiovascular: Normal rate, regular rhythm. Grossly normal heart sounds.  Good peripheral circulation. Respiratory: Normal respiratory effort.  No retractions. Lungs CTAB. Musculoskeletal: His upper and lower extremities without difficulty. Normal gait was noted. Neurologic:  Normal speech and language. No gross focal neurologic deficits are appreciated.  Skin:  Skin is warm, dry and intact. No rash noted. Psychiatric: Mood and affect are normal. Speech and behavior are normal.  ____________________________________________   LABS (all labs ordered are listed, but only abnormal results are displayed)  Labs Reviewed - No data to display   PROCEDURES  Procedure(s) performed: None  Procedures  Critical Care performed: No  ____________________________________________   INITIAL IMPRESSION / ASSESSMENT AND PLAN / ED COURSE  Pertinent labs & imaging results that were available during my care of the patient were reviewed by me and considered in my medical decision making (see chart for details).  Mother is to continue taking Flonase and also I suggested Sudafed PE for nasal congestion. She is also given a prescription for Flonase nasal spray to be used daily. She is to [facility] if any continued problems.     ____________________________________________   FINAL CLINICAL IMPRESSION(S) / ED DIAGNOSES  Final diagnoses:  Upper respiratory tract infection, unspecified type      NEW MEDICATIONS STARTED DURING THIS VISIT:  Discharge Medication List as of 04/04/2017 10:45 AM    START taking these medications   Details  fluticasone (FLONASE) 50 MCG/ACT nasal spray Place 2 sprays into both nostrils daily., Starting Mon 04/04/2017, Until Tue 04/04/2018, Print         Note:  This document was prepared using Dragon voice recognition software and  may include unintentional dictation errors.    Tommi RumpsSummers, Vonzell Lindblad L, PA-C 04/04/17 1143    Rockne MenghiniNorman, Anne-Caroline, MD 04/04/17 [phone]

## 2017-04-13 IMAGING — CR DG RIBS W/ CHEST 3+V*L*
1 series · 3 of 3 positions shown · non-contrast
Comparison: 08/03/2015

CLINICAL DATA: 25-year-old female with a history of bronchitis

EXAM:
LEFT RIBS AND CHEST - 3+ VIEW

[Series 1: w chest pa · 0.14mm/px · 3 of 3 slices shown]
[im 1/3]
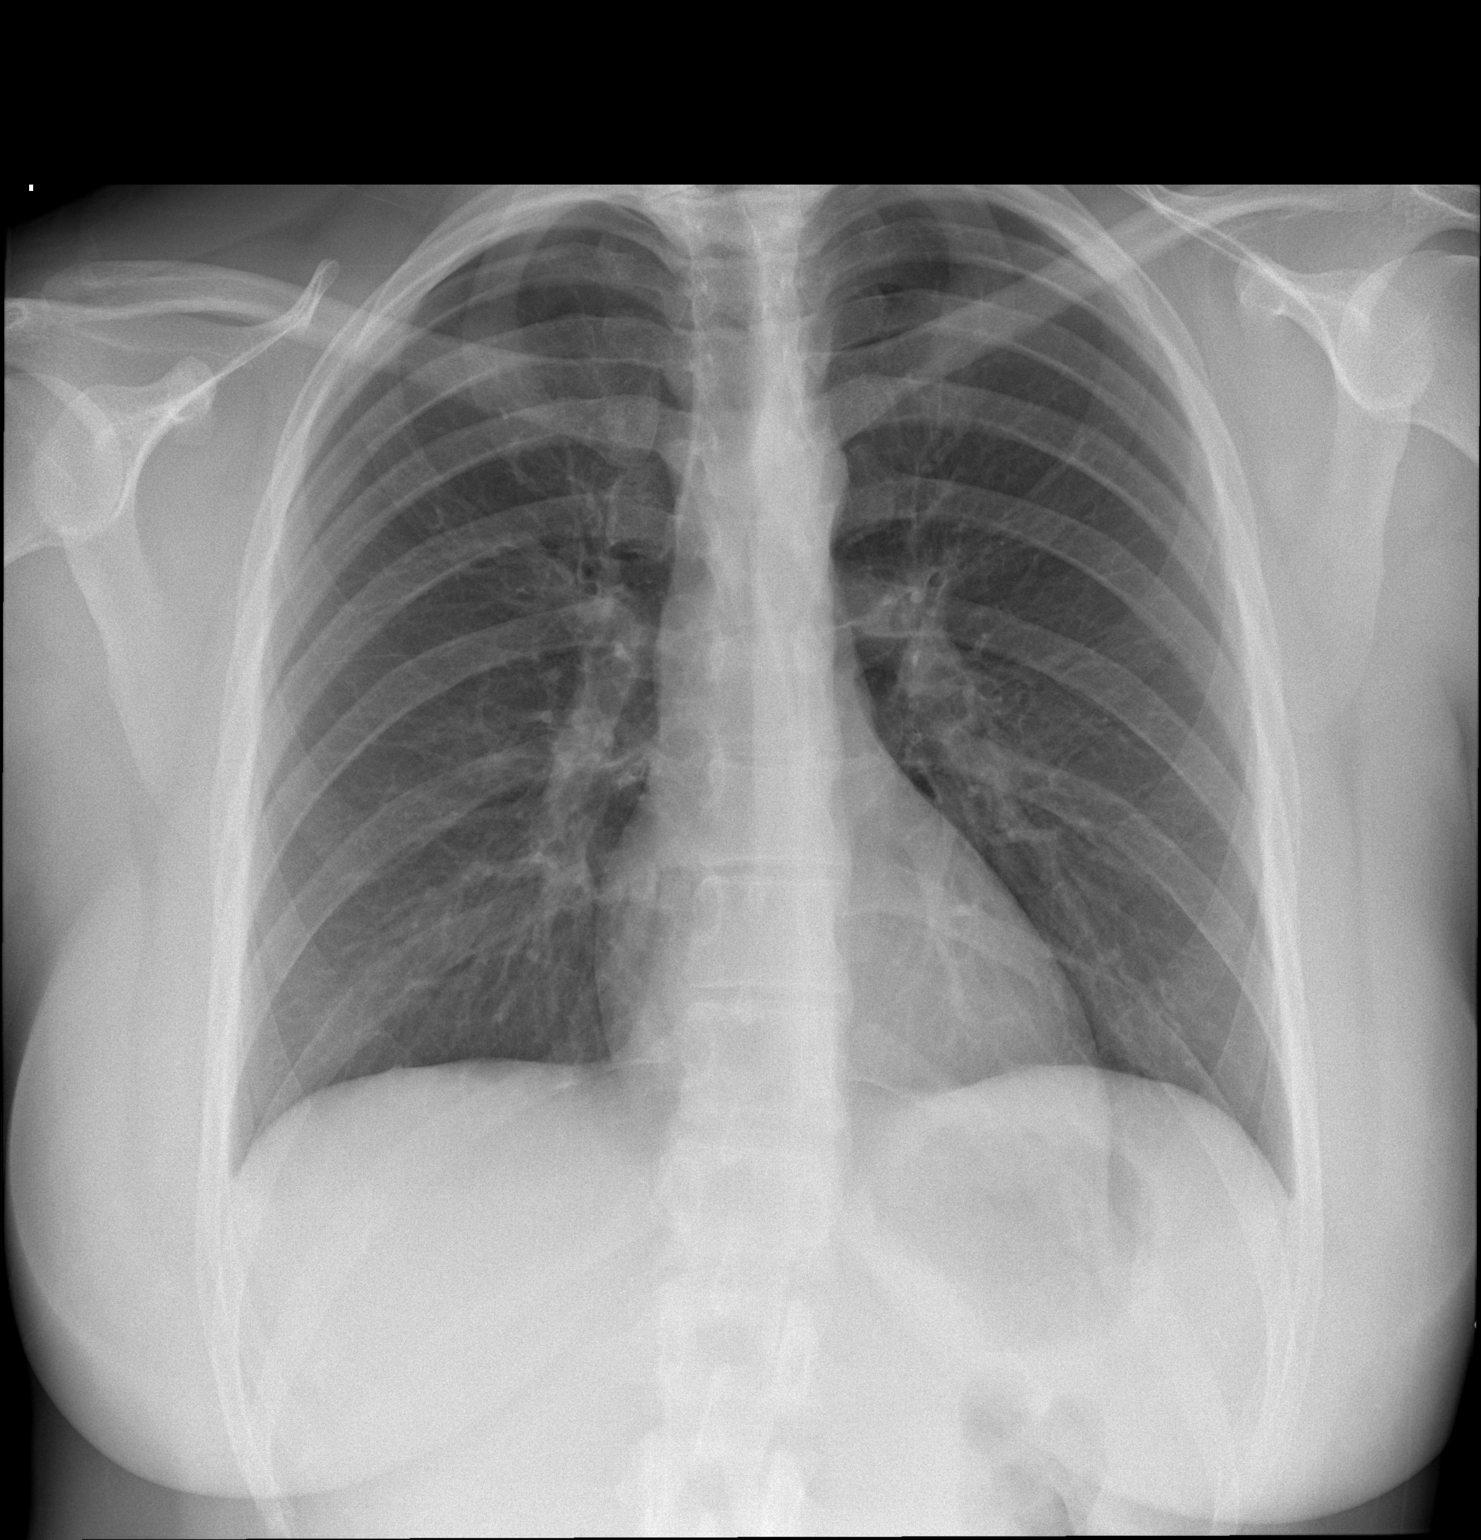
[im 2/3]
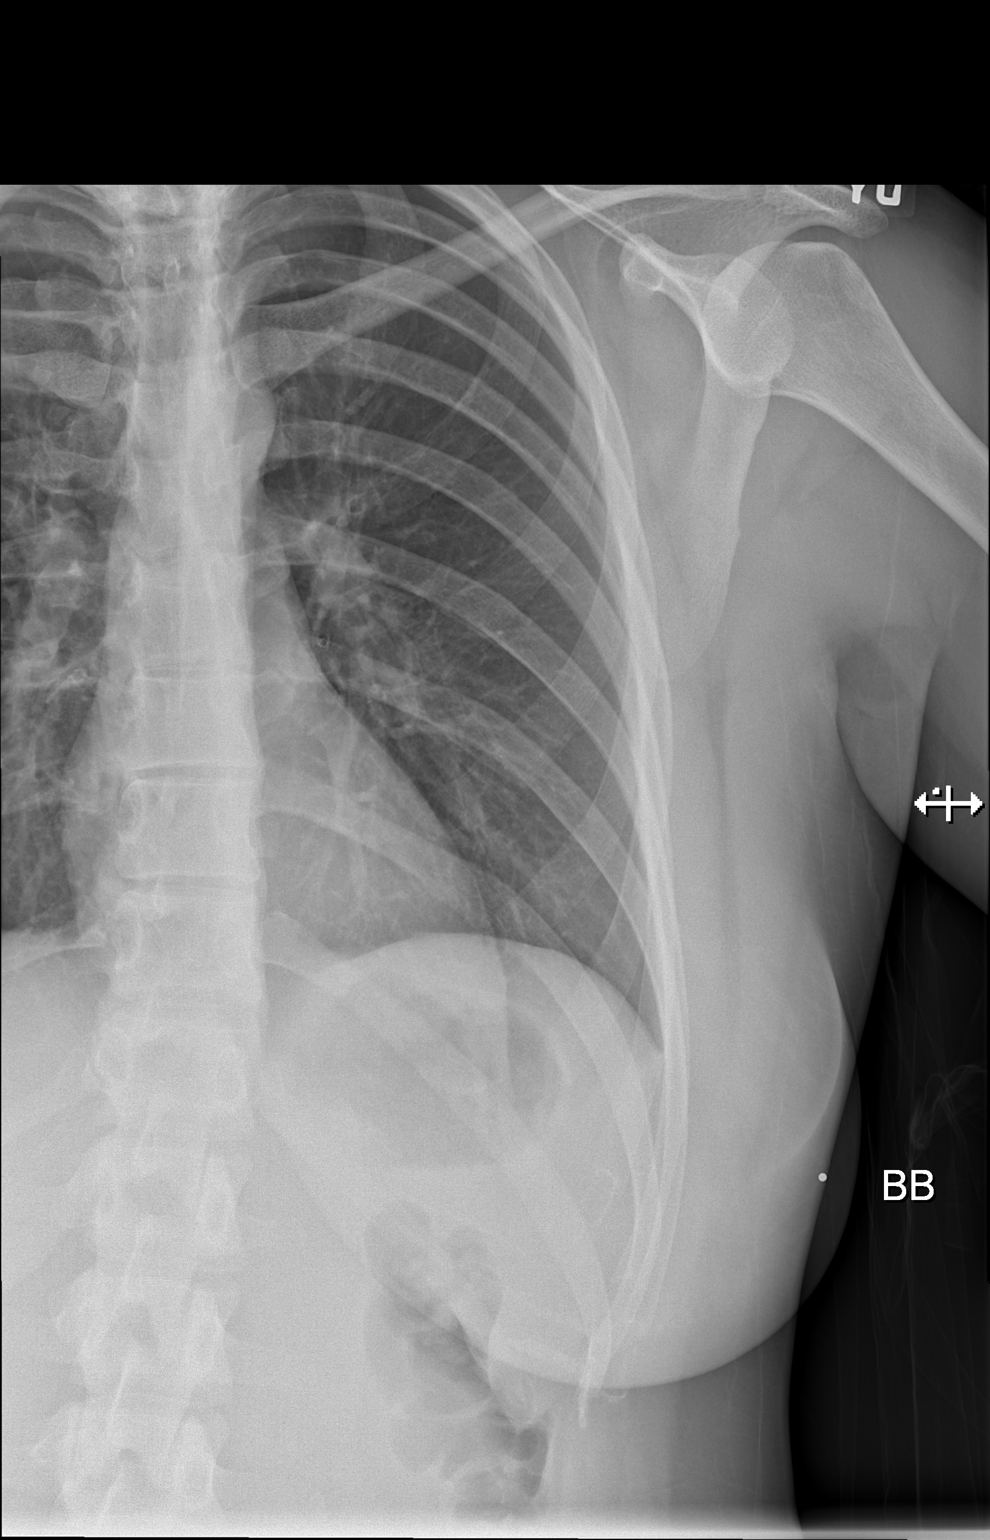
[im 3/3]
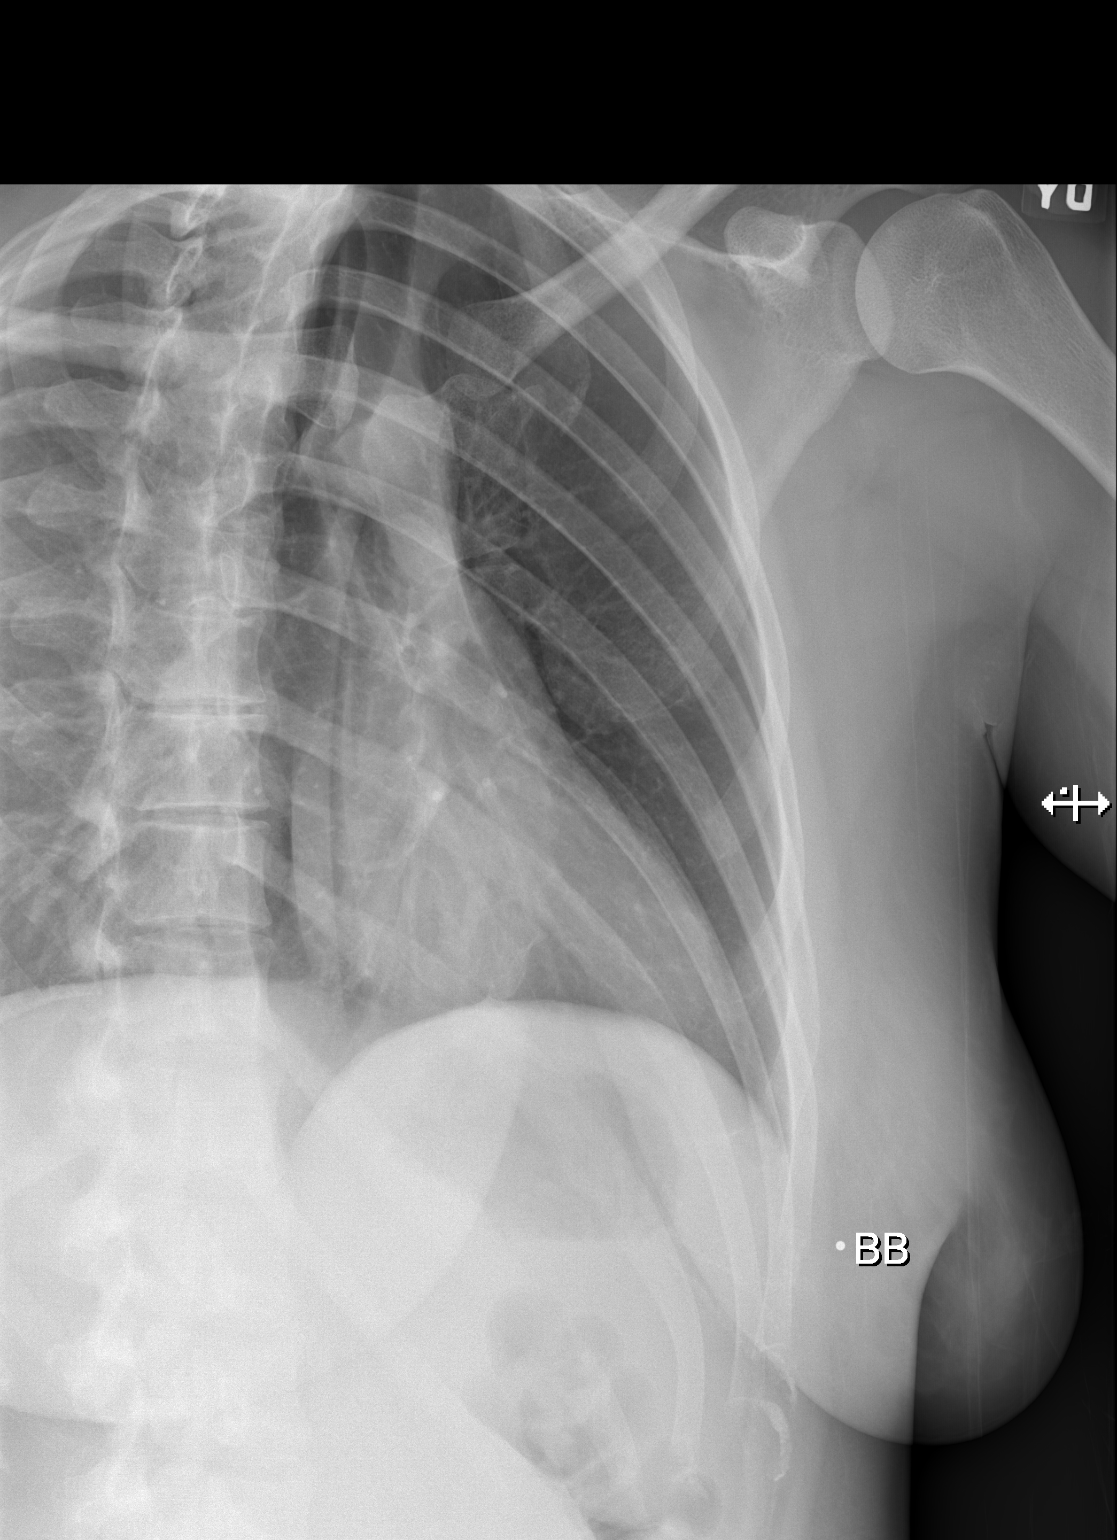

[3 of 3 positions shown; findings below may reference images not displayed]

FINDINGS: No fracture or other bone lesions are seen involving the ribs. There
is no evidence of pneumothorax or pleural effusion. Both lungs are
clear. Heart size and mediastinal contours are within normal limits.
IMPRESSION: Negative.

## 2017-05-01 ENCOUNTER — Encounter: Payer: Self-pay | Admitting: Emergency Medicine

## 2017-05-01 ENCOUNTER — Emergency Department
Admission: EM | Admit: 2017-05-01 | Discharge: 2017-05-01 | Disposition: A | Discharge status: Home / Self Care | Payer: Medicaid Other | Attending: Emergency Medicine | Admitting: Emergency Medicine

## 2017-05-01 DIAGNOSIS — G8929 Other chronic pain: Secondary | ICD-10-CM

## 2017-05-01 DIAGNOSIS — M25512 Pain in left shoulder: Secondary | ICD-10-CM | POA: Insufficient documentation

## 2017-05-01 MED ORDER — PREDNISONE 10 MG PO TABS: ORAL_TABLET | ORAL | 0 refills | Status: DC

## 2017-05-01 NOTE — ED Triage Notes (Signed)
Pt with possible left arm strain, wants an xray.

## 2017-05-01 NOTE — ED Provider Notes (Signed)
[facility] Emergency Department Provider Note  ____________________________________________   First MD Initiated Contact with Patient 05/01/17 [phone]     (approximate)  I have reviewed the triage vital signs and the nursing notes.   HISTORY  Chief Complaint Arm Injury   HPI April Tran is a 26 y.o. female if you have complaint of possible left arm strain that occurred "weeks ago". Patient states that she was building a fence for her animals and has been caring heavy items for this project. She denies falling or any injury directly to her arm. She is infrequently taking over the counter medication for her pain. She has not seen her PCP for this. Currently she rates her pain as 6/10.   Past Medical History:  Diagnosis Date  . Depression     Patient Active Problem List   Diagnosis Date Noted  . Gestational hypertension, third trimester 12/09/2016  . Post-dates pregnancy 12/09/2016  . Pregnancy 12/03/2016  . Indication for care in labor and delivery, antepartum 11/26/2016  . Labor and delivery, indication for care 11/07/2016  . Uterine contractions during pregnancy 10/15/2016  . Fall at home 07/26/2016    Past Surgical History:  Procedure Laterality Date  . TONSILLECTOMY    . TUBAL LIGATION Bilateral 12/10/2016   Procedure: BILATERAL TUBAL LIGATION;  Surgeon: Elenora Fender Ward, MD;  Location: [facility] ORS;  Service: Gynecology;  Laterality: Bilateral;  . WISDOM TOOTH EXTRACTION      Prior to Admission medications   Medication Sig Start Date End Date Taking? Authorizing Provider  fluticasone (FLONASE) 50 MCG/ACT nasal spray Place 2 sprays into both nostrils daily. 04/04/17 04/04/18  Tommi Rumps, PA-C  ibuprofen (ADVIL,MOTRIN) 600 MG tablet Take 1 tablet (600 mg total) by mouth every 6 (six) hours. 12/11/16   Schermerhorn, Ihor Austin, MD  predniSONE (DELTASONE) 10 MG tablet Take 6 tablets  today, on day 2 take 5 tablets, day 3 take 4 tablets, day 4  take 3 tablets, day 5 take  2 tablets and 1 tablet the last day 05/01/17   Tommi Rumps, PA-C  sertraline (ZOLOFT) 25 MG tablet Take 25 mg by mouth daily.    [provider]    Allergies Morphine and related  Family History  Problem Relation Age of Onset  . Hypertension Mother   . Depression Mother   . Hypertension Father   . Diabetes Father   . Depression Father   . Hypertension Maternal Aunt   . Depression Maternal Aunt   . Hypertension Paternal Aunt   . Hypertension Maternal Grandmother   . Depression Maternal Grandmother   . Hypertension Maternal Grandfather   . Depression Maternal Grandfather   . Heart disease Maternal Grandfather   . Hypertension Paternal Grandmother   . Depression Paternal Grandmother   . Hypertension Paternal Grandfather   . Depression Paternal Grandfather     Social History Social History  Substance Use Topics  . Smoking status: Never Smoker  . Smokeless tobacco: Never Used  . Alcohol use No    Review of Systems Constitutional: No fever/chills Cardiovascular: Denies chest pain. Respiratory: Denies shortness of breath. Gastrointestinal:  No nausea, no vomiting.  Musculoskeletal: Positive left upper extremity pain. Skin: Negative for rash. Neurological: Negative for  focal weakness or numbness.   ____________________________________________   PHYSICAL EXAM:  VITAL SIGNS: ED Triage Vitals  Enc Vitals Group     BP 05/01/17 0819 124/88     Pulse Rate 05/01/17 0819 82  Resp 05/01/17 0819 18     Temp 05/01/17 0819 98 F (36.7 C)     Temp Source 05/01/17 0819 Oral     SpO2 05/01/17 0819 97 %     Weight 05/01/17 0819 220 lb (99.8 kg)     Height --      Head Circumference --      Peak Flow --      Pain Score 05/01/17 0818 6     Pain Loc --      Pain Edu? --      Excl. in GC? --    Constitutional: Alert and oriented. Well appearing and in no acute distress. Eyes: Conjunctivae are normal. PERRL. EOMI. Head:  Atraumatic. Neck: No stridor.   Cardiovascular: Normal rate, regular rhythm. Grossly normal heart sounds.  Good peripheral circulation. Respiratory: Normal respiratory effort.  No retractions. Lungs CTAB. Musculoskeletal: On examination of the upper left extremity there is no gross deformity however there is moderate tenderness on palpation of the anterior left shoulder. There is no crepitus with range of motion and no restriction. No soft tissue swelling was noted. There is no tenderness on palpation or swelling of the left elbow and range of motion is unrestricted. There is tenderness with range of motion of the left wrist. There is no soft tissue swelling or deformity noted. Skin is intact. No ecchymosis, abrasions or erythema was noted. Neurologic:  Normal speech and language. No gross focal neurologic deficits are appreciated.  Skin:  Skin is warm, dry and intact. No rash noted. Psychiatric: Mood and affect are normal. Speech and behavior are normal.  ____________________________________________   LABS (all labs ordered are listed, but only abnormal results are displayed)  Labs Reviewed - No data to display  PROCEDURES  Procedure(s) performed: None  Procedures  Critical Care performed: No  ____________________________________________   INITIAL IMPRESSION / ASSESSMENT AND PLAN / ED COURSE  Pertinent labs & imaging results that were available during my care of the patient were reviewed by me and considered in my medical decision making (see chart for details).  Patient has had frequently taken over-the-counter medication which has not helped with her pain. Patient is not breast feeding. There was no direct trauma related to her shoulder. Patient was placed on prednisone 60 mg for 6 day taper and encouraged to use ice to her shoulder and wrist. She is to follow-up with her PCP at [facility] clinic if any continued problems at which time a referral to an orthopedist may be indicated.  X-rays were deferred at this time.   ____________________________________________   FINAL CLINICAL IMPRESSION(S) / ED DIAGNOSES  Final diagnoses:  Chronic left shoulder pain      NEW MEDICATIONS STARTED DURING THIS VISIT:  Discharge Medication List as of 05/01/2017  9:19 AM    START taking these medications   Details  predniSONE (DELTASONE) 10 MG tablet Take 6 tablets  today, on day 2 take 5 tablets, day 3 take 4 tablets, day 4 take 3 tablets, day 5 take  2 tablets and 1 tablet the last day, Print         Note:  This document was prepared using Dragon voice recognition software and may include unintentional dictation errors.    Tommi Rumps, PA-C 05/01/17 1257    Jeanmarie Plant, MD 05/02/17 [phone]

## 2017-05-01 NOTE — Discharge Instructions (Signed)
Follow-up with [facility] clinic if any continued problems. Make an appointment in 1-2 weeks for follow-up. Begin taking prednisone today and taper as written on your prescription. Use ice packs to your shoulder and wrist.  He may continue to take Tylenol as needed for breakthrough pain.

## 2017-09-29 ENCOUNTER — Other Ambulatory Visit: Payer: Self-pay

## 2017-09-29 ENCOUNTER — Emergency Department: Payer: Self-pay

## 2017-09-29 ENCOUNTER — Emergency Department
Admission: EM | Admit: 2017-09-29 | Discharge: 2017-09-29 | Disposition: A | Discharge status: Home / Self Care | Payer: Self-pay | Attending: Emergency Medicine | Admitting: Emergency Medicine

## 2017-09-29 DIAGNOSIS — Y999 Unspecified external cause status: Secondary | ICD-10-CM | POA: Insufficient documentation

## 2017-09-29 DIAGNOSIS — X501XXA Overexertion from prolonged static or awkward postures, initial encounter: Secondary | ICD-10-CM | POA: Insufficient documentation

## 2017-09-29 DIAGNOSIS — Z79899 Other long term (current) drug therapy: Secondary | ICD-10-CM | POA: Insufficient documentation

## 2017-09-29 DIAGNOSIS — Y929 Unspecified place or not applicable: Secondary | ICD-10-CM | POA: Insufficient documentation

## 2017-09-29 DIAGNOSIS — S86912A Strain of unspecified muscle(s) and tendon(s) at lower leg level, left leg, initial encounter: Secondary | ICD-10-CM | POA: Insufficient documentation

## 2017-09-29 DIAGNOSIS — Y939 Activity, unspecified: Secondary | ICD-10-CM | POA: Insufficient documentation

## 2017-09-29 MED ORDER — NAPROXEN 500 MG PO TABS
500.0000 mg | ORAL_TABLET | Freq: Once | ORAL | Status: AC
  Administered 2017-09-29: 500 mg via ORAL
  Filled 2017-09-29: qty 1

## 2017-09-29 MED ORDER — NAPROXEN 500 MG PO TABS: 500.0000 mg | ORAL_TABLET | Freq: Two times a day (BID) | ORAL | Status: DC

## 2017-09-29 NOTE — Discharge Instructions (Signed)
Following discharge care instruction and wear elastic support as needed.

## 2017-09-29 NOTE — ED Triage Notes (Signed)
Pt states she twisted and fell injurying her left leg from the knee to mid shin. Pt ambulatory in the WR..Marland Kitchen

## 2017-09-29 NOTE — ED Provider Notes (Signed)
[facility] Emergency Department Provider Note   ____________________________________________   First MD Initiated Contact with Patient 09/29/17 1422     (approximate)  I have reviewed the triage vital signs and the nursing notes.   HISTORY  Chief Complaint Leg Pain    HPI April Tran is a 27 y.o. female patient complaining of left knee pain secondary to twisting fall 3 days ago.  Patient states able to bear weight but with difficulty.  Patient rates pain as a 3/10.  No pulses measured for complaint.  Past Medical History:  Diagnosis Date  . Depression     Patient Active Problem List   Diagnosis Date Noted  . Gestational hypertension, third trimester 12/09/2016  . Post-dates pregnancy 12/09/2016  . Pregnancy 12/03/2016  . Indication for care in labor and delivery, antepartum 11/26/2016  . Labor and delivery, indication for care 11/07/2016  . Uterine contractions during pregnancy 10/15/2016  . Fall at home 07/26/2016    Past Surgical History:  Procedure Laterality Date  . TONSILLECTOMY    . TUBAL LIGATION Bilateral 12/10/2016   Procedure: BILATERAL TUBAL LIGATION;  Surgeon: Elenora Fender Ward, MD;  Location: [facility] ORS;  Service: Gynecology;  Laterality: Bilateral;  . WISDOM TOOTH EXTRACTION      Prior to Admission medications   Medication Sig Start Date End Date Taking? Authorizing Provider  fluticasone (FLONASE) 50 MCG/ACT nasal spray Place 2 sprays into both nostrils daily. 04/04/17 04/04/18  Tommi Rumps, PA-C  ibuprofen (ADVIL,MOTRIN) 600 MG tablet Take 1 tablet (600 mg total) by mouth every 6 (six) hours. 12/11/16   Schermerhorn, Ihor Austin, MD  naproxen (NAPROSYN) 500 MG tablet Take 1 tablet (500 mg total) by mouth 2 (two) times daily with a meal. 09/29/17   Joni Reining, PA-C  predniSONE (DELTASONE) 10 MG tablet Take 6 tablets  today, on day 2 take 5 tablets, day 3 take 4 tablets, day 4 take 3 tablets, day 5 take  2 tablets and 1  tablet the last day 05/01/17   Tommi Rumps, PA-C  sertraline (ZOLOFT) 25 MG tablet Take 25 mg by mouth daily.    [provider]    Allergies Morphine and related  Family History  Problem Relation Age of Onset  . Hypertension Mother   . Depression Mother   . Hypertension Father   . Diabetes Father   . Depression Father   . Hypertension Maternal Aunt   . Depression Maternal Aunt   . Hypertension Paternal Aunt   . Hypertension Maternal Grandmother   . Depression Maternal Grandmother   . Hypertension Maternal Grandfather   . Depression Maternal Grandfather   . Heart disease Maternal Grandfather   . Hypertension Paternal Grandmother   . Depression Paternal Grandmother   . Hypertension Paternal Grandfather   . Depression Paternal Grandfather     Social History Social History   Tobacco Use  . Smoking status: Never Smoker  . Smokeless tobacco: Never Used  Substance Use Topics  . Alcohol use: No  . Drug use: No    Review of Systems Constitutional: No fever/chills Eyes: No visual changes. ENT: No sore throat. Cardiovascular: Denies chest pain. Respiratory: Denies shortness of breath. Gastrointestinal: No abdominal pain.  No nausea, no vomiting.  No diarrhea.  No constipation. Genitourinary: Negative for dysuria. Musculoskeletal: Left knee pain Skin: Negative for rash. Neurological: Negative for headaches, focal weakness or numbness. Psychiatric:Depression Endocrine: Diabetes and hypertension Allergic/Immunilogical: Morphine ____________________________________________   PHYSICAL EXAM:  VITAL  SIGNS: ED Triage Vitals [09/29/17 1342]  Enc Vitals Group     BP 137/84     Pulse Rate 74     Resp 14     Temp 97.6 F (36.4 C)     Temp Source Oral     SpO2 97 %     Weight 230 lb (104.3 kg)     Height 5\' 6"  (1.676 m)     Head Circumference      Peak Flow      Pain Score      Pain Loc      Pain Edu?      Excl. in GC?    Constitutional: Alert and  oriented. Well appearing and in no acute distress.  Morbid obesity Neck:No cervical spine tenderness to palpation. Hematological/Lymphatic/Immunilogical: No cervical lymphadenopathy. Cardiovascular: Normal rate, regular rhythm. Grossly normal heart sounds.  Good peripheral circulation. Respiratory: Normal respiratory effort.  No retractions. Lungs CTAB. Musculoskeletal: No obvious deformity to the left knee.  No obvious edema.  Moderate crepitus to palpation.  Patient has full and equal range of motion. Neurologic:  Normal speech and language. No gross focal neurologic deficits are appreciated. No gait instability. Skin:  Skin is warm, dry and intact. No rash noted. Psychiatric: Mood and affect are normal. Speech and behavior are normal.  ____________________________________________   LABS (all labs ordered are listed, but only abnormal results are displayed)  Labs Reviewed - No data to display ____________________________________________  EKG   ____________________________________________  RADIOLOGY  Dg Knee Complete 4 Views Left  Result Date: 09/29/2017 CLINICAL DATA:  Status post fall 3 days ago with left knee pain. EXAM: LEFT KNEE - COMPLETE 4+ VIEW COMPARISON:  March 01, 2011 FINDINGS: No evidence of fracture, dislocation, or joint effusion. No evidence of arthropathy or other focal bone abnormality. Soft tissues are unremarkable. IMPRESSION: Negative. Electronically Signed   By: Sherian ReinWei-Chen  Lin M.D.   On: 09/29/2017 14:23    ____________________________________________   PROCEDURES  Procedure(s) performed: None  Procedures  Critical Care performed: No  ____________________________________________   INITIAL IMPRESSION / ASSESSMENT AND PLAN / ED COURSE  As part of my medical decision making, I reviewed the following data within the electronic MEDICAL RECORD NUMBER    Left knee pain secondary to sprain.  Discussed negative x-ray findings with patient.  Patient given  discharge care instruction.  Patient given Ace wrap and a prescription for naproxen.  Patient advised to follow-up PCP if condition persists.      ____________________________________________   FINAL CLINICAL IMPRESSION(S) / ED DIAGNOSES  Final diagnoses:  Strain of left knee, initial encounter     ED Discharge Orders        Ordered    naproxen (NAPROSYN) 500 MG tablet  2 times daily with meals     09/29/17 1428       Note:  This document was prepared using Dragon voice recognition software and may include unintentional dictation errors.    Joni ReiningSmith, Iva Posten K, PA-C 09/29/17 1435    Emily FilbertWilliams, Jonathan E, MD 09/29/17 [phone]

## 2017-09-29 NOTE — ED Notes (Signed)
See triage note  Presents with pain to left knee s/p fall 2-3 days ago  States pain is anterior  No swelling note

## 2018-01-17 ENCOUNTER — Other Ambulatory Visit: Payer: Self-pay

## 2018-01-17 ENCOUNTER — Emergency Department
Admission: EM | Admit: 2018-01-17 | Discharge: 2018-01-17 | Disposition: A | Discharge status: Home / Self Care | Payer: Self-pay | Attending: Emergency Medicine | Admitting: Emergency Medicine

## 2018-01-17 ENCOUNTER — Encounter: Payer: Self-pay | Admitting: Emergency Medicine

## 2018-01-17 ENCOUNTER — Emergency Department: Payer: Self-pay

## 2018-01-17 DIAGNOSIS — Z79899 Other long term (current) drug therapy: Secondary | ICD-10-CM | POA: Insufficient documentation

## 2018-01-17 DIAGNOSIS — W010XXA Fall on same level from slipping, tripping and stumbling without subsequent striking against object, initial encounter: Secondary | ICD-10-CM | POA: Insufficient documentation

## 2018-01-17 DIAGNOSIS — Y929 Unspecified place or not applicable: Secondary | ICD-10-CM | POA: Insufficient documentation

## 2018-01-17 DIAGNOSIS — S60221A Contusion of right hand, initial encounter: Secondary | ICD-10-CM | POA: Insufficient documentation

## 2018-01-17 DIAGNOSIS — Y939 Activity, unspecified: Secondary | ICD-10-CM | POA: Insufficient documentation

## 2018-01-17 DIAGNOSIS — Y999 Unspecified external cause status: Secondary | ICD-10-CM | POA: Insufficient documentation

## 2018-01-17 MED ORDER — IBUPROFEN 600 MG PO TABS: 600.0000 mg | ORAL_TABLET | Freq: Three times a day (TID) | ORAL | 0 refills | Status: DC | PRN

## 2018-01-17 NOTE — Discharge Instructions (Addendum)
Ice and elevation to reduce swelling and help with pain.  Ibuprofen 600 mg 3 times daily with food.  Follow-up with your primary care provider if any continued problems.  Wear Ace wrap as needed for comfort and support.

## 2018-01-17 NOTE — ED Provider Notes (Signed)
[facility] Emergency Department Provider Note  ___________________________________________   First MD Initiated Contact with Patient 01/17/18 [phone]     (approximate)  I have reviewed the triage vital signs and the nursing notes.   HISTORY  Chief Complaint Hand Injury  HPI April Tran is a 27 y.o. female is here with complaint of right hand injury.  Patient states that she tripped and fell this morning.  She denies any head injury or loss of consciousness during this fall.  She has continued to have some discomfort to her right hand particularly on the fifth metacarpal.  She has taken ibuprofen with some improvement.  She rates her pain as a 5 out of 10.   Past Medical History:  Diagnosis Date  . Depression     Patient Active Problem List   Diagnosis Date Noted  . Gestational hypertension, third trimester 12/09/2016  . Post-dates pregnancy 12/09/2016  . Pregnancy 12/03/2016  . Indication for care in labor and delivery, antepartum 11/26/2016  . Labor and delivery, indication for care 11/07/2016  . Uterine contractions during pregnancy 10/15/2016  . Fall at home 07/26/2016    Past Surgical History:  Procedure Laterality Date  . TONSILLECTOMY    . TUBAL LIGATION Bilateral 12/10/2016   Procedure: BILATERAL TUBAL LIGATION;  Surgeon: Elenora Fender Ward, MD;  Location: [facility] ORS;  Service: Gynecology;  Laterality: Bilateral;  . WISDOM TOOTH EXTRACTION      Prior to Admission medications   Medication Sig Start Date End Date Taking? Authorizing Provider  fluticasone (FLONASE) 50 MCG/ACT nasal spray Place 2 sprays into both nostrils daily. 04/04/17 04/04/18  Tommi Rumps, PA-C  ibuprofen (ADVIL,MOTRIN) 600 MG tablet Take 1 tablet (600 mg total) by mouth every 8 (eight) hours as needed. 01/17/18   Tommi Rumps, PA-C  sertraline (ZOLOFT) 25 MG tablet Take 25 mg by mouth daily.    [provider]    Allergies Morphine and  related  Family History  Problem Relation Age of Onset  . Hypertension Mother   . Depression Mother   . Hypertension Father   . Diabetes Father   . Depression Father   . Hypertension Maternal Aunt   . Depression Maternal Aunt   . Hypertension Paternal Aunt   . Hypertension Maternal Grandmother   . Depression Maternal Grandmother   . Hypertension Maternal Grandfather   . Depression Maternal Grandfather   . Heart disease Maternal Grandfather   . Hypertension Paternal Grandmother   . Depression Paternal Grandmother   . Hypertension Paternal Grandfather   . Depression Paternal Grandfather     Social History Social History   Tobacco Use  . Smoking status: Never Smoker  . Smokeless tobacco: Never Used  Substance Use Topics  . Alcohol use: No  . Drug use: No    Review of Systems Constitutional: No fever/chills Cardiovascular: Denies chest pain. Respiratory: Denies shortness of breath. Musculoskeletal: Positive right hand pain. Skin: Negative for injury. Neurological: Negative for  focal weakness or numbness. ____________________________________________   PHYSICAL EXAM:  VITAL SIGNS: ED Triage Vitals  Enc Vitals Group     BP 01/17/18 0859 127/82     Pulse Rate 01/17/18 0859 80     Resp 01/17/18 0859 16     Temp 01/17/18 0859 98.3 F (36.8 C)     Temp Source 01/17/18 0859 Oral     SpO2 01/17/18 0859 98 %     Weight 01/17/18 0900 236 lb (107 kg)  Height 01/17/18 0900  (1.676 m)     Head Circumference --      Peak Flow --      Pain Score 01/17/18 0859 5     Pain Loc --      Pain Edu? --      Excl. in GC? --    Constitutional: Alert and oriented. Well appearing and in no acute distress. Eyes: Conjunctivae are normal.  Head: Atraumatic. Neck: No stridor.   Cardiovascular: Normal rate, regular rhythm. Grossly normal heart sounds.  Good peripheral circulation. Respiratory: Normal respiratory effort.  No retractions. Lungs CTAB. Musculoskeletal: Right  hand with no gross deformity however there is some soft tissue edema present on the fifth metacarpal area.  Moderate tenderness on palpation.  Patient is able to move digits without any difficulty motor sensory function intact.  There is no ecchymosis or abrasions seen to the hand.  There is minimal tenderness on palpation of the distal ulna and radius.  Patient is able to flex and extend with some minimal discomfort. Neurologic:  Normal speech and language. No gross focal neurologic deficits are appreciated.  Skin:  Skin is warm, dry and intact. No rash noted. Psychiatric: Mood and affect are normal. Speech and behavior are normal.  ____________________________________________   LABS (all labs ordered are listed, but only abnormal results are displayed)  Labs Reviewed - No data to display RADIOLOGY  ED MD interpretation:   Right hand x-ray is negative for fracture dislocation.  Official radiology report(s): Dg Hand Complete Right  Result Date: 01/17/2018 CLINICAL DATA:  Trip and fall with right hand pain, initial encounter EXAM: RIGHT HAND - COMPLETE 3+ VIEW COMPARISON:  12/07/2010 FINDINGS: There is no evidence of fracture or dislocation. There is no evidence of arthropathy or other focal bone abnormality. Soft tissues are unremarkable. IMPRESSION: No acute abnormality noted. Electronically Signed   By: Alcide Clever M.D.   On: 01/17/2018 10:05    ____________________________________________   PROCEDURES  Procedure(s) performed:   .Splint Application Date/Time: 01/17/2018 10:19 AM Performed by: Madelyn Flavors, NT Authorized by: Tommi Rumps, PA-C   Consent:    Consent obtained:  Verbal   Consent given by:  Patient Pre-procedure details:    Sensation:  Normal Procedure details:    Laterality:  Right   Location:  Hand   Hand:  R hand   Strapping: no     Supplies:  Elastic bandage Post-procedure details:    Patient tolerance of procedure:  Tolerated well, no  immediate complications    Critical Care performed: No  ____________________________________________   INITIAL IMPRESSION / ASSESSMENT AND PLAN / ED COURSE  Patient is here with complaint of right hand injury and x-rays were reassuring that there was no fracture.  Patient was made aware.  Patient was placed in a Jones wrap and given a prescription for ibuprofen 600 mg 3 times daily with food.  She is to follow-up with her PCP if any continued problems.  ____________________________________________   FINAL CLINICAL IMPRESSION(S) / ED DIAGNOSES  Final diagnoses:  Contusion of right hand, initial encounter     ED Discharge Orders        Ordered    ibuprofen (ADVIL,MOTRIN) 600 MG tablet  Every 8 hours PRN     01/17/18 1005       Note:  This document was prepared using Dragon voice recognition software and may include unintentional dictation errors.    Tommi Rumps, PA-C 01/17/18 1020  Jeanmarie Plant, MD 01/17/18 [phone]

## 2018-01-17 NOTE — ED Triage Notes (Signed)
Pt states she tripped and fell this morning and injured her right hand

## 2018-01-17 NOTE — ED Notes (Signed)
See triage note   Presents with right hand pain   States she fell landed on hand  Good pulses

## 2018-02-20 ENCOUNTER — Emergency Department: Payer: Medicaid Other

## 2018-02-20 ENCOUNTER — Other Ambulatory Visit: Payer: Self-pay

## 2018-02-20 ENCOUNTER — Emergency Department
Admission: EM | Admit: 2018-02-20 | Discharge: 2018-02-20 | Disposition: A | Discharge status: Home / Self Care | Payer: Medicaid Other | Attending: Emergency Medicine | Admitting: Emergency Medicine

## 2018-02-20 DIAGNOSIS — Z79899 Other long term (current) drug therapy: Secondary | ICD-10-CM | POA: Insufficient documentation

## 2018-02-20 DIAGNOSIS — N938 Other specified abnormal uterine and vaginal bleeding: Secondary | ICD-10-CM

## 2018-02-20 DIAGNOSIS — R102 Pelvic and perineal pain unspecified side: Secondary | ICD-10-CM

## 2018-02-20 LAB — CBC WITH DIFFERENTIAL/PLATELET
BASOS PCT: 1 %
Basophils Absolute: 0 10*3/uL (ref 0–0.1)
EOS ABS: 0.1 10*3/uL (ref 0–0.7)
Eosinophils Relative: 2 %
HCT: 43.4 % (ref 35.0–47.0)
HEMOGLOBIN: 14.7 g/dL (ref 12.0–16.0)
Lymphocytes Relative: 32 %
Lymphs Abs: 2.2 10*3/uL (ref 1.0–3.6)
MCH: 28 pg (ref 26.0–34.0)
MCHC: 33.8 g/dL (ref 32.0–36.0)
MCV: 82.9 fL (ref 80.0–100.0)
MONOS PCT: 6 %
Monocytes Absolute: 0.4 10*3/uL (ref 0.2–0.9)
NEUTROS PCT: 59 %
Neutro Abs: 4.2 10*3/uL (ref 1.4–6.5)
Platelets: 183 10*3/uL (ref 150–440)
RBC: 5.24 MIL/uL — ABNORMAL HIGH (ref 3.80–5.20)
RDW: 13.8 % (ref 11.5–14.5)
WBC: 7 10*3/uL (ref 3.6–11.0)

## 2018-02-20 LAB — COMPREHENSIVE METABOLIC PANEL
ALBUMIN: 4.6 g/dL (ref 3.5–5.0)
ALK PHOS: 96 U/L (ref 38–126)
ALT: 16 U/L (ref 14–54)
ANION GAP: 8 (ref 5–15)
AST: 23 U/L (ref 15–41)
BUN: 12 mg/dL (ref 6–20)
CO2: 26 mmol/L (ref 22–32)
Calcium: 9.2 mg/dL (ref 8.9–10.3)
Chloride: 102 mmol/L (ref 101–111)
Creatinine, Ser: 0.93 mg/dL (ref 0.44–1.00)
GFR calc Af Amer: >60 mL/min (ref >60)
GFR calc non Af Amer: >60 mL/min (ref >60)
GLUCOSE: 85 mg/dL (ref 65–99)
POTASSIUM: 3.3 mmol/L — AB (ref 3.5–5.1)
SODIUM: 136 mmol/L (ref 135–145)
Total Bilirubin: 0.5 mg/dL (ref 0.3–1.2)
Total Protein: 8.5 g/dL — ABNORMAL HIGH (ref 6.5–8.1)

## 2018-02-20 LAB — URINALYSIS, COMPLETE (UACMP) WITH MICROSCOPIC
BACTERIA UA: NONE SEEN
BILIRUBIN URINE: NEGATIVE
Glucose, UA: NEGATIVE mg/dL
KETONES UR: NEGATIVE mg/dL
LEUKOCYTES UA: NEGATIVE
NITRITE: NEGATIVE
PH: 5 (ref 5.0–8.0)
PROTEIN: NEGATIVE mg/dL
Specific Gravity, Urine: 1.011 (ref 1.005–1.030)

## 2018-02-20 LAB — CHLAMYDIA/NGC RT PCR (ARMC ONLY)
Chlamydia Tr: NOT DETECTED
N gonorrhoeae: NOT DETECTED

## 2018-02-20 LAB — WET PREP, GENITAL
CLUE CELLS WET PREP: NONE SEEN
SPERM: NONE SEEN
Trich, Wet Prep: NONE SEEN
Yeast Wet Prep HPF POC: NONE SEEN

## 2018-02-20 LAB — POCT PREGNANCY, URINE: Preg Test, Ur: NEGATIVE

## 2018-02-20 MED ORDER — KETOROLAC TROMETHAMINE 60 MG/2ML IM SOLN
60.0000 mg | Freq: Once | INTRAMUSCULAR | Status: AC
  Administered 2018-02-20: 60 mg via INTRAMUSCULAR
  Filled 2018-02-20: qty 2

## 2018-02-20 MED ORDER — IBUPROFEN 800 MG PO TABS: 800.0000 mg | ORAL_TABLET | Freq: Three times a day (TID) | ORAL | 0 refills | Status: DC | PRN

## 2018-02-20 NOTE — ED Notes (Signed)
Pt attempted to sign e-signature pad multiple times without signature crossing over.

## 2018-02-20 NOTE — Discharge Instructions (Addendum)
Please take Tylenol or Motrin, as needed for pelvic pain.  Make a follow-up appointment with your gynecologist to discuss your abnormal periods and your pelvic pain.  Return to the emergency department if you develop severe pain, nausea or vomiting, inability to keep down fluids, fever, or any other symptoms concerning to you.

## 2018-02-20 NOTE — ED Triage Notes (Signed)
Pt c/o pelvic pain since March - she states it hurts to void and have bowel movements, have sex, stand - no relief from OTC meds

## 2018-02-20 NOTE — ED Notes (Signed)
URINE WAS NOT OBTAINED IT WAS SIGNED OFF BY MISTAKE

## 2018-02-20 NOTE — ED Provider Notes (Signed)
[facility] Emergency Department Provider Note  ____________________________________________  Time seen: Approximately 5:15 PM  I have reviewed the triage vital signs and the nursing notes.   HISTORY  Chief Complaint Pelvic Pain    HPI April Tran is a 27 y.o. female, nonpregnant, presenting with pelvic pain.  The patient reports that since March, she has had a progressively worsening suprapubic pain.  She describes a sharp pain, which hurts with sitting, standing, walking, sexual intercourse, and defecation.  She is also having vaginal spotting multiple times per month.  She has tried Tylenol and Motrin and another over-the-counter pain relievers, heating pads, without improvement.  She has talked to her primary care physician, and was told she has "PID" but has not been put on antibiotics.  She has not seen her gynecologist for her pain.  No personal hx of fibroids or endometriosis.  Past Medical History:  Diagnosis Date  . Depression     Patient Active Problem List   Diagnosis Date Noted  . Gestational hypertension, third trimester 12/09/2016  . Post-dates pregnancy 12/09/2016  . Pregnancy 12/03/2016  . Indication for care in labor and delivery, antepartum 11/26/2016  . Labor and delivery, indication for care 11/07/2016  . Uterine contractions during pregnancy 10/15/2016  . Fall at home 07/26/2016    Past Surgical History:  Procedure Laterality Date  . TONSILLECTOMY    . TUBAL LIGATION Bilateral 12/10/2016   Procedure: BILATERAL TUBAL LIGATION;  Surgeon: Elenora Fender Ward, MD;  Location: [facility] ORS;  Service: Gynecology;  Laterality: Bilateral;  . WISDOM TOOTH EXTRACTION      Current Outpatient Rx  . Order #: [id number] Class: Print  . Order #: [id number] Class: Print  . Order #: [id number] Class: Historical Med    Allergies Morphine and related  Family History  Problem Relation Age of Onset  . Hypertension Mother   . Depression Mother   .  Hypertension Father   . Diabetes Father   . Depression Father   . Hypertension Maternal Aunt   . Depression Maternal Aunt   . Hypertension Paternal Aunt   . Hypertension Maternal Grandmother   . Depression Maternal Grandmother   . Hypertension Maternal Grandfather   . Depression Maternal Grandfather   . Heart disease Maternal Grandfather   . Hypertension Paternal Grandmother   . Depression Paternal Grandmother   . Hypertension Paternal Grandfather   . Depression Paternal Grandfather     Social History Social History   Tobacco Use  . Smoking status: Never Smoker  . Smokeless tobacco: Never Used  Substance Use Topics  . Alcohol use: No  . Drug use: No    Review of Systems Constitutional: No fever/chills.  No lightheadedness or syncope. Eyes: No visual changes. ENT: No sore throat. No congestion or rhinorrhea. Cardiovascular: Denies chest pain. Denies palpitations. Respiratory: Denies shortness of breath.  No cough. Gastrointestinal: As of suprapubic abdominal pain.  No nausea, no vomiting.  No diarrhea.  No constipation. Genitourinary: Negative for dysuria.  No urinary frequency.  Positive vaginal spotting.  Positive pelvic pain.  Musculoskeletal: Negative for back pain. Skin: Negative for rash. Neurological: Negative for headaches. No focal numbness, tingling or weakness.     ____________________________________________   PHYSICAL EXAM:  VITAL SIGNS: ED Triage Vitals  Enc Vitals Group     BP 02/20/18 1224 (!) 142/76     Pulse Rate 02/20/18 1224 (!) 106     Resp 02/20/18 1224 15     Temp 02/20/18 1224 98.1 F (36.7 C)  Temp Source 02/20/18 1224 Oral     SpO2 02/20/18 1224 99 %     Weight 02/20/18 1225 235 lb (106.6 kg)     Height 02/20/18 1225 5\' 6"  (1.676 m)     Head Circumference --      Peak Flow --      Pain Score 02/20/18 1225 7     Pain Loc --      Pain Edu? --      Excl. in GC? --     Constitutional: Alert and oriented.Answers questions  appropriately.  Mildly uncomfortable but in no acute distress. Eyes: Conjunctivae are normal.  EOMI. No scleral icterus. Head: Atraumatic. Nose: No congestion/rhinnorhea. Mouth/Throat: Mucous membranes are moist.  Neck: No stridor.  Supple.   Cardiovascular: Normal rate, regular rhythm. No murmurs, rubs or gallops.  Respiratory: Normal respiratory effort.  No accessory muscle use or retractions. Lungs CTAB.  No wheezes, rales or ronchi. Gastrointestinal: Soft, nontender and nondistended.  No guarding or rebound.  No peritoneal signs. Genitourinary: Normal-appearing external genitalia without lesions. Normal vaginal exam with physiologic discharge, normal-appearing cervix, normal vaginal wall tissue. Bimanual exam is negative for CMT, adnexal tenderness to palpation, no palpable masses. Musculoskeletal: No LE edema. Neurologic:  A&Ox3.  Speech is clear.  Face and smile are symmetric.  EOMI.  Moves all extremities well. Skin:  Skin is warm, dry and intact. No rash noted. Psychiatric: Mood and affect are normal. Speech and behavior are normal.  Normal judgement.  ____________________________________________   LABS (all labs ordered are listed, but only abnormal results are displayed)  Labs Reviewed  WET PREP, GENITAL - Abnormal; Notable for the following components:      Result Value   WBC, Wet Prep HPF POC MANY (*)    All other components within normal limits  URINALYSIS, COMPLETE (UACMP) WITH MICROSCOPIC - Abnormal; Notable for the following components:   Color, Urine YELLOW (*)    APPearance CLEAR (*)    Hgb urine dipstick MODERATE (*)    All other components within normal limits  CBC WITH DIFFERENTIAL/PLATELET - Abnormal; Notable for the following components:   RBC 5.24 (*)    All other components within normal limits  COMPREHENSIVE METABOLIC PANEL - Abnormal; Notable for the following components:   Potassium 3.3 (*)    Total Protein 8.5 (*)    All other components within normal  limits  CHLAMYDIA/NGC RT PCR ([facility] ONLY)  POC URINE PREG, ED  POCT PREGNANCY, URINE   ____________________________________________  EKG  Not indicated ____________________________________________  RADIOLOGY  US Pelvic Doppler (torsion R/o Or Mass Arterial Flow)  Result Date: 02/20/2018 CLINICAL DATA:  Pelvic pain for approximately 3 months. Negative pregnancy test. EXAM: TRANSABDOMINAL AND TRANSVAGINAL ULTRASOUND OF PELVIS DOPPLER ULTRASOUND OF OVARIES TECHNIQUE: Both transabdominal and transvaginal ultrasound examinations of the pelvis were performed. Transabdominal technique was performed for global imaging of the pelvis including uterus, ovaries, adnexal regions, and pelvic cul-de-sac. It was necessary to proceed with endovaginal exam following the transabdominal exam to visualize the ovaries. Color and duplex Doppler ultrasound was utilized to evaluate blood flow to the ovaries. COMPARISON:  None. FINDINGS: Uterus Measurements: 9.5 x 4.1 x 4.2 cm. No fibroids or other mass visualized. Endometrium Thickness: 10 mm.  No focal abnormality visualized. Right ovary Measurements: Not visualized, however no adnexal mass identified. Left ovary Measurements: 2.3 x 2.0 x 1.9 cm. Normal appearance/no adnexal mass. Pulsed Doppler evaluation of the left ovary demonstrates normal low-resistance arterial and venous waveforms. Pulsed Doppler evaluation  of the right ovary could not be performed as it could not be visualized. Other findings No abnormal free fluid. IMPRESSION: Normal appearance of uterus and left ovary. Nonvisualization of right ovary, however no adnexal mass identified. No sonographic evidence for left ovarian torsion. Right ovary could not be visualized. Electronically Signed   By: Myles Rosenthal M.D.   On: 02/20/2018 18:20   US Pelvic Complete With Transvaginal  Result Date: 02/20/2018 CLINICAL DATA:  Pelvic pain for approximately 3 months. Negative pregnancy test. EXAM: TRANSABDOMINAL  AND TRANSVAGINAL ULTRASOUND OF PELVIS DOPPLER ULTRASOUND OF OVARIES TECHNIQUE: Both transabdominal and transvaginal ultrasound examinations of the pelvis were performed. Transabdominal technique was performed for global imaging of the pelvis including uterus, ovaries, adnexal regions, and pelvic cul-de-sac. It was necessary to proceed with endovaginal exam following the transabdominal exam to visualize the ovaries. Color and duplex Doppler ultrasound was utilized to evaluate blood flow to the ovaries. COMPARISON:  None. FINDINGS: Uterus Measurements: 9.5 x 4.1 x 4.2 cm. No fibroids or other mass visualized. Endometrium Thickness: 10 mm.  No focal abnormality visualized. Right ovary Measurements: Not visualized, however no adnexal mass identified. Left ovary Measurements: 2.3 x 2.0 x 1.9 cm. Normal appearance/no adnexal mass. Pulsed Doppler evaluation of the left ovary demonstrates normal low-resistance arterial and venous waveforms. Pulsed Doppler evaluation of the right ovary could not be performed as it could not be visualized. Other findings No abnormal free fluid. IMPRESSION: Normal appearance of uterus and left ovary. Nonvisualization of right ovary, however no adnexal mass identified. No sonographic evidence for left ovarian torsion. Right ovary could not be visualized. Electronically Signed   By: Myles Rosenthal M.D.   On: 02/20/2018 18:20    ____________________________________________   PROCEDURES  Procedure(s) performed: None  Procedures  Critical Care performed: No ____________________________________________   INITIAL IMPRESSION / ASSESSMENT AND PLAN / ED COURSE  Pertinent labs & imaging results that were available during my care of the patient were reviewed by me and considered in my medical decision making (see chart for details).  27 y.o. female, nonpregnant, presenting with pelvic pain for 3 months.  Overall, the patient is hemodynamically stable and afebrile.  There are multiple  possible etiologies, including endometriosis, fibroid disease, infection such as STI or bacterial vaginosis, teratoma, UTI.  Appendicitis or diverticulitis is very unlikely given the length of time the patient has been having symptoms.  The patient has reassuring laboratory evaluation including a negative urinalysis, negative pregnancy test, normal white blood cell count, normal blood counts.  She does have a potassium 3.3, which is stable for her.  We will get cultures and do a pelvic examination, and have the patient undergo ultrasound of the pelvis.  Symptomatic treatment will be initiated.  Plan reevaluation for final disposition.   ----------------------------------------- 8:14 PM on 02/20/2018 -----------------------------------------  The patient's work-up in the emergency department has been reassuring.  Her pelvic cultures do not show any evidence of infection.  Her urinalysis does not show infection.  Her white blood cell count is normal.  Her ultrasound does not visualize the right ovary but the patient has suprapubic not right-sided pain; the remainder of her pelvic ultrasonography is within normal limits.  The patient continues to be hemodynamically stable.  At this time, the patient is safe for discharge home.  I have encouraged her to follow-up with her gynecologist for her dysfunctional uterine bleeding, and to discuss her pelvic pain.  Return precautions were discussed as well.  ____________________________________________  FINAL CLINICAL IMPRESSION(S) /  ED DIAGNOSES  Final diagnoses:  Pelvic pain  Pelvic pain in female  Dysfunctional uterine bleeding         NEW MEDICATIONS STARTED DURING THIS VISIT:  New Prescriptions   IBUPROFEN (ADVIL,MOTRIN) 800 MG TABLET    Take 1 tablet (800 mg total) by mouth every 8 (eight) hours as needed for mild pain or moderate pain (with food).      Rockne MenghiniNorman, Anne-Caroline, MD 02/20/18 2015

## 2018-09-12 ENCOUNTER — Emergency Department

## 2018-09-12 ENCOUNTER — Emergency Department
Admission: EM | Admit: 2018-09-12 | Discharge: 2018-09-12 | Disposition: A | Discharge status: Home / Self Care | Attending: Emergency Medicine | Admitting: Emergency Medicine

## 2018-09-12 ENCOUNTER — Other Ambulatory Visit: Payer: Self-pay

## 2018-09-12 ENCOUNTER — Encounter: Payer: Self-pay | Admitting: Emergency Medicine

## 2018-09-12 DIAGNOSIS — Y9289 Other specified places as the place of occurrence of the external cause: Secondary | ICD-10-CM | POA: Diagnosis not present

## 2018-09-12 DIAGNOSIS — Y998 Other external cause status: Secondary | ICD-10-CM | POA: Diagnosis not present

## 2018-09-12 DIAGNOSIS — S93402A Sprain of unspecified ligament of left ankle, initial encounter: Secondary | ICD-10-CM | POA: Diagnosis not present

## 2018-09-12 DIAGNOSIS — S99912A Unspecified injury of left ankle, initial encounter: Secondary | ICD-10-CM | POA: Diagnosis present

## 2018-09-12 DIAGNOSIS — Y9389 Activity, other specified: Secondary | ICD-10-CM | POA: Insufficient documentation

## 2018-09-12 DIAGNOSIS — X500XXA Overexertion from strenuous movement or load, initial encounter: Secondary | ICD-10-CM | POA: Diagnosis not present

## 2018-09-12 MED ORDER — MELOXICAM 15 MG PO TABS: 15.0000 mg | ORAL_TABLET | Freq: Every day | ORAL | 0 refills | Status: DC

## 2018-09-12 NOTE — Discharge Instructions (Signed)
By a ASO lace up stirrup ankle brace for further immobilization of your ankle.

## 2018-09-12 NOTE — ED Notes (Signed)
See triage note  Presents with pain to left foot  States she dropped a coffee mug on foot and twisted her ankle  No deformity note  Good pulses

## 2018-09-12 NOTE — ED Triage Notes (Signed)
Pt to ED from home c/o left foot pain.  States dropped coffee mug on foot on Christmas and then twisted same ankle last night.  Able to bear weight with a limp at this time, steady gait.  No obvious deformity to foot noted.

## 2018-09-12 NOTE — ED Notes (Signed)
AAOx3.  Skin warm and dry.  NAD 

## 2018-09-12 NOTE — ED Provider Notes (Signed)
[facility] Emergency Department Provider Note  ____________________________________________  Time seen: Approximately 4:35 PM  I have reviewed the triage vital signs and the nursing notes.   HISTORY  Chief Complaint Foot Injury    HPI April Tran is a 27 y.o. female who presents the emergency department complaining of left foot and ankle pain.  Patient reports that 5 days ago she dropped a heavy can on the side of her foot.  Patient reports that her foot has been painful since then.  Yesterday when she was attempting to get out of bed, she stepped awkwardly out of the bed, rolling her ankle.  Patient has continued pain, some swelling to the lateral aspect of the ankle.  She is here for evaluation for possible fracture.  No other injury or complaint.  She is taking Advil with no relief.    Past Medical History:  Diagnosis Date  . Depression     Patient Active Problem List   Diagnosis Date Noted  . Gestational hypertension, third trimester 12/09/2016  . Post-dates pregnancy 12/09/2016  . Pregnancy 12/03/2016  . Indication for care in labor and delivery, antepartum 11/26/2016  . Labor and delivery, indication for care 11/07/2016  . Uterine contractions during pregnancy 10/15/2016  . Fall at home 07/26/2016    Past Surgical History:  Procedure Laterality Date  . TONSILLECTOMY    . TUBAL LIGATION Bilateral 12/10/2016   Procedure: BILATERAL TUBAL LIGATION;  Surgeon: Elenora Fender Ward, MD;  Location: [facility] ORS;  Service: Gynecology;  Laterality: Bilateral;  . WISDOM TOOTH EXTRACTION      Prior to Admission medications   Medication Sig Start Date End Date Taking? Authorizing Provider  fluticasone (FLONASE) 50 MCG/ACT nasal spray Place 2 sprays into both nostrils daily. 04/04/17 04/04/18  Tommi Rumps, PA-C  ibuprofen (ADVIL,MOTRIN) 800 MG tablet Take 1 tablet (800 mg total) by mouth every 8 (eight) hours as needed for mild pain or moderate pain (with  food). 02/20/18   Rockne Menghini, MD  meloxicam (MOBIC) 15 MG tablet Take 1 tablet (15 mg total) by mouth daily. 09/12/18   Shatana Saxton, Delorise Royals, PA-C  sertraline (ZOLOFT) 25 MG tablet Take 25 mg by mouth daily.    [provider]    Allergies Morphine and related  Family History  Problem Relation Age of Onset  . Hypertension Mother   . Depression Mother   . Hypertension Father   . Diabetes Father   . Depression Father   . Hypertension Maternal Aunt   . Depression Maternal Aunt   . Hypertension Paternal Aunt   . Hypertension Maternal Grandmother   . Depression Maternal Grandmother   . Hypertension Maternal Grandfather   . Depression Maternal Grandfather   . Heart disease Maternal Grandfather   . Hypertension Paternal Grandmother   . Depression Paternal Grandmother   . Hypertension Paternal Grandfather   . Depression Paternal Grandfather     Social History Social History   Tobacco Use  . Smoking status: Never Smoker  . Smokeless tobacco: Never Used  Substance Use Topics  . Alcohol use: No  . Drug use: No     Review of Systems  Constitutional: No fever/chills Eyes: No visual changes.  Cardiovascular: no chest pain. Respiratory: no cough. No SOB. Gastrointestinal: No abdominal pain.  No nausea, no vomiting.   Musculoskeletal: Positive for left ankle pain/injury Skin: Negative for rash, abrasions, lacerations, ecchymosis. Neurological: Negative for headaches, focal weakness or numbness. 10-point ROS otherwise negative.  ____________________________________________  PHYSICAL EXAM:  VITAL SIGNS: ED Triage Vitals [09/12/18 1548]  Enc Vitals Group     BP 119/77     Pulse Rate 77     Resp 16     Temp 97.8 F (36.6 C)     Temp Source Oral     SpO2 100 %     Weight 215 lb (97.5 kg)     Height 5\' 6"  (1.676 m)     Head Circumference      Peak Flow      Pain Score 4     Pain Loc      Pain Edu?      Excl. in GC?      Constitutional:  Alert and oriented. Well appearing and in no acute distress. Eyes: Conjunctivae are normal. PERRL. EOMI. Head: Atraumatic. Neck: No stridor.    Cardiovascular: Normal rate, regular rhythm. Normal S1 and S2.  Good peripheral circulation. Respiratory: Normal respiratory effort without tachypnea or retractions. Lungs CTAB. Good air entry to the bases with no decreased or absent breath sounds. Musculoskeletal: Full range of motion to all extremities. No gross deformities appreciated.  Visualization of the left ankle reveals no deformity.  Mild edema and ecchymosis to the anterolateral aspect of the ankle.  Patient is very tender to palpation along the anterior talofibular ligament distribution with no palpable deficits.  No other palpable tenderness to the left foot or ankle.  Patient is able to extend, flex the ankle joint appropriately.  Dorsalis pedis pulse intact.  Sensation intact all 5 digits. Neurologic:  Normal speech and language. No gross focal neurologic deficits are appreciated.  Skin:  Skin is warm, dry and intact. No rash noted. Psychiatric: Mood and affect are normal. Speech and behavior are normal. Patient exhibits appropriate insight and judgement.   ____________________________________________   LABS (all labs ordered are listed, but only abnormal results are displayed)  Labs Reviewed - No data to display ____________________________________________  EKG   ____________________________________________  RADIOLOGY I personally viewed and evaluated these images as part of my medical decision making, as well as reviewing the written report by the radiologist.  I concur with radiology of no acute osseous abnormality to the left foot or ankle.  Dg Ankle Complete Left  Result Date: 09/12/2018 CLINICAL DATA:  Pain following twisting injury EXAM: LEFT ANKLE COMPLETE - 3+ VIEW COMPARISON:  March 01, 2011 FINDINGS: Frontal, oblique, and lateral views obtained. There is no appreciable  fracture or joint effusion. There is no appreciable joint space narrowing or erosion. There is a small inferior calcaneal spur. Ankle mortise appears intact. IMPRESSION: Small inferior calcaneal spur. No evident fracture or arthropathy. Ankle mortise appears intact. Electronically Signed   By: Bretta BangWilliam  Woodruff III M.D.   On: 09/12/2018 16:11   Dg Foot Complete Left  Result Date: 09/12/2018 CLINICAL DATA:  Pain following twisting injury EXAM: LEFT FOOT - COMPLETE 3+ VIEW COMPARISON:  None. FINDINGS: Frontal, oblique, and lateral views were obtained. There is no fracture or dislocation. Joint spaces appear normal. No erosive change. There is a small inferior calcaneal spur. IMPRESSION: Small inferior calcaneal spur. No fracture or dislocation. No appreciable arthropathy. Electronically Signed   By: Bretta BangWilliam  Woodruff III M.D.   On: 09/12/2018 16:09    ____________________________________________    PROCEDURES  Procedure(s) performed:    Procedures    Medications - No data to display   ____________________________________________   INITIAL IMPRESSION / ASSESSMENT AND PLAN / ED COURSE  Pertinent labs & imaging results  that were available during my care of the patient were reviewed by me and considered in my medical decision making (see chart for details).  Review of the [place] CSRS was performed in accordance of the [facility] prior to dispensing any controlled drugs.      Patient's diagnosis is consistent with left ankle sprain.  Patient presents emergency department complaining of left ankle pain after 2 separate injuries.  Exam was overall reassuring.  X-rays revealed no acute osseous abnormality.  No indication of acute ligamentous rupture.  Patient is given Ace bandage and crutches for symptom relief.  Patient will be prescribed meloxicam for additional symptom control.  Follow-up with primary care or orthopedics as needed..  Patient is given ED precautions to return to the ED for any  worsening or new symptoms.     ____________________________________________  FINAL CLINICAL IMPRESSION(S) / ED DIAGNOSES  Final diagnoses:  Sprain of left ankle, unspecified ligament, initial encounter      NEW MEDICATIONS STARTED DURING THIS VISIT:  ED Discharge Orders         Ordered    meloxicam (MOBIC) 15 MG tablet  Daily     09/12/18 1649              This chart was dictated using voice recognition software/Dragon. Despite best efforts to proofread, errors can occur which can change the meaning. Any change was purely unintentional.    Racheal PatchesCuthriell, Elis Sauber D, PA-C 09/12/18 1649    Minna AntisPaduchowski, Kevin, MD 09/12/18 2300

## 2019-11-20 ENCOUNTER — Emergency Department

## 2019-11-20 ENCOUNTER — Other Ambulatory Visit: Payer: Self-pay

## 2019-11-20 ENCOUNTER — Emergency Department
Admission: EM | Admit: 2019-11-20 | Discharge: 2019-11-20 | Disposition: A | Discharge status: Home / Self Care | Attending: Emergency Medicine | Admitting: Emergency Medicine

## 2019-11-20 DIAGNOSIS — Z9851 Tubal ligation status: Secondary | ICD-10-CM | POA: Diagnosis not present

## 2019-11-20 DIAGNOSIS — R110 Nausea: Secondary | ICD-10-CM | POA: Diagnosis not present

## 2019-11-20 DIAGNOSIS — R1032 Left lower quadrant pain: Secondary | ICD-10-CM | POA: Insufficient documentation

## 2019-11-20 LAB — URINALYSIS, COMPLETE (UACMP) WITH MICROSCOPIC
Bilirubin Urine: NEGATIVE
Glucose, UA: NEGATIVE mg/dL
Hgb urine dipstick: NEGATIVE
Ketones, ur: NEGATIVE mg/dL
Leukocytes,Ua: NEGATIVE
Nitrite: NEGATIVE
Protein, ur: NEGATIVE mg/dL
Specific Gravity, Urine: 1.008 (ref 1.005–1.030)
pH: 6 (ref 5.0–8.0)

## 2019-11-20 LAB — CBC
HCT: 42.7 % (ref 36.0–46.0)
Hemoglobin: 14.4 g/dL (ref 12.0–15.0)
MCH: 29.3 pg (ref 26.0–34.0)
MCHC: 33.7 g/dL (ref 30.0–36.0)
MCV: 87 fL (ref 80.0–100.0)
Platelets: 156 10*3/uL (ref 150–400)
RBC: 4.91 MIL/uL (ref 3.87–5.11)
RDW: 12 % (ref 11.5–15.5)
WBC: 6.5 10*3/uL (ref 4.0–10.5)
nRBC: 0 % (ref 0.0–0.2)

## 2019-11-20 LAB — COMPREHENSIVE METABOLIC PANEL
ALT: 16 U/L (ref 0–44)
AST: 19 U/L (ref 15–41)
Albumin: 4.4 g/dL (ref 3.5–5.0)
Alkaline Phosphatase: 66 U/L (ref 38–126)
Anion gap: 10 (ref 5–15)
BUN: 10 mg/dL (ref 6–20)
CO2: 24 mmol/L (ref 22–32)
Calcium: 9 mg/dL (ref 8.9–10.3)
Chloride: 103 mmol/L (ref 98–111)
Creatinine, Ser: 0.75 mg/dL (ref 0.44–1.00)
GFR calc Af Amer: >60 mL/min (ref >60)
GFR calc non Af Amer: >60 mL/min (ref >60)
Glucose, Bld: 86 mg/dL (ref 70–99)
Potassium: 3.4 mmol/L — ABNORMAL LOW (ref 3.5–5.1)
Sodium: 137 mmol/L (ref 135–145)
Total Bilirubin: 0.9 mg/dL (ref 0.3–1.2)
Total Protein: 7.6 g/dL (ref 6.5–8.1)

## 2019-11-20 LAB — POC URINE PREG, ED: Preg Test, Ur: NEGATIVE

## 2019-11-20 LAB — LIPASE, BLOOD: Lipase: 26 U/L (ref 11–51)

## 2019-11-20 MED ORDER — ONDANSETRON 4 MG PO TBDP: 4.0000 mg | ORAL_TABLET | Freq: Three times a day (TID) | ORAL | 0 refills | Status: DC | PRN

## 2019-11-20 MED ORDER — ONDANSETRON HCL 4 MG/2ML IJ SOLN
4.0000 mg | Freq: Once | INTRAMUSCULAR | Status: AC
  Administered 2019-11-20: 16:00:00 4 mg via INTRAVENOUS
  Filled 2019-11-20: qty 2

## 2019-11-20 MED ORDER — SODIUM CHLORIDE 0.9% FLUSH: 3.0000 mL | Freq: Once | INTRAVENOUS | Status: DC

## 2019-11-20 MED ORDER — IOHEXOL 300 MG/ML  SOLN
100.0000 mL | Freq: Once | INTRAMUSCULAR | Status: AC | PRN
  Administered 2019-11-20: 16:00:00 100 mL via INTRAVENOUS
  Filled 2019-11-20: qty 100

## 2019-11-20 MED ORDER — TRAMADOL HCL 50 MG PO TABS: 50.0000 mg | ORAL_TABLET | Freq: Four times a day (QID) | ORAL | 0 refills | Status: AC | PRN

## 2019-11-20 MED ORDER — KETOROLAC TROMETHAMINE 30 MG/ML IJ SOLN
30.0000 mg | Freq: Once | INTRAMUSCULAR | Status: AC
  Administered 2019-11-20: 30 mg via INTRAVENOUS
  Filled 2019-11-20: qty 1

## 2019-11-20 NOTE — ED Provider Notes (Signed)
[facility] Emergency Department Provider Note   ____________________________________________    I have reviewed the triage vital signs and the nursing notes.   HISTORY  Chief Complaint Abdominal Pain     HPI April Tran is a 29 y.o. female who presents with complaints of abdominal pain.  Patient describes left lower quadrant abdominal pain which is been constant nearly 2 months, she reports sometimes it is mild occasionally it "flares up".  It does make her nauseated at times.  Denies diarrhea.  No fevers or chills.  Has a history of a tubal ligation.  No vaginal discharge.  Has not take anything for this.  Past Medical History:  Diagnosis Date  . Depression     Patient Active Problem List   Diagnosis Date Noted  . Gestational hypertension, third trimester 12/09/2016  . Post-dates pregnancy 12/09/2016  . Pregnancy 12/03/2016  . Indication for care in labor and delivery, antepartum 11/26/2016  . Labor and delivery, indication for care 11/07/2016  . Uterine contractions during pregnancy 10/15/2016  . Fall at home 07/26/2016    Past Surgical History:  Procedure Laterality Date  . TONSILLECTOMY    . TUBAL LIGATION Bilateral 12/10/2016   Procedure: BILATERAL TUBAL LIGATION;  Surgeon: Elenora Fender Ward, MD;  Location: [facility] ORS;  Service: Gynecology;  Laterality: Bilateral;  . WISDOM TOOTH EXTRACTION      Prior to Admission medications   Medication Sig Start Date End Date Taking? Authorizing Provider  fluticasone (FLONASE) 50 MCG/ACT nasal spray Place 2 sprays into both nostrils daily. 04/04/17 04/04/18  Tommi Rumps, PA-C  ibuprofen (ADVIL,MOTRIN) 800 MG tablet Take 1 tablet (800 mg total) by mouth every 8 (eight) hours as needed for mild pain or moderate pain (with food). 02/20/18   Rockne Menghini, MD  meloxicam (MOBIC) 15 MG tablet Take 1 tablet (15 mg total) by mouth daily. 09/12/18   Cuthriell, Delorise Royals, PA-C  ondansetron  (ZOFRAN ODT) 4 MG disintegrating tablet Take 1 tablet (4 mg total) by mouth every 8 (eight) hours as needed. 11/20/19   Jene Every, MD  sertraline (ZOLOFT) 25 MG tablet Take 25 mg by mouth daily.    [provider]  traMADol (ULTRAM) 50 MG tablet Take 1 tablet (50 mg total) by mouth every 6 (six) hours as needed. 11/20/19 11/19/20  Jene Every, MD     Allergies Morphine and related  Family History  Problem Relation Age of Onset  . Hypertension Mother   . Depression Mother   . Hypertension Father   . Diabetes Father   . Depression Father   . Hypertension Maternal Aunt   . Depression Maternal Aunt   . Hypertension Paternal Aunt   . Hypertension Maternal Grandmother   . Depression Maternal Grandmother   . Hypertension Maternal Grandfather   . Depression Maternal Grandfather   . Heart disease Maternal Grandfather   . Hypertension Paternal Grandmother   . Depression Paternal Grandmother   . Hypertension Paternal Grandfather   . Depression Paternal Grandfather     Social History Social History   Tobacco Use  . Smoking status: Never Smoker  . Smokeless tobacco: Never Used  Substance Use Topics  . Alcohol use: No  . Drug use: No    Review of Systems  Constitutional: No fever/chills Eyes: No visual changes.  ENT: No sore throat. Cardiovascular: Denies chest pain. Respiratory: Denies shortness of breath. Gastrointestinal: As above Genitourinary: Negative for dysuria.  No hematuria, no discharge Musculoskeletal: Negative for  back pain. Skin: Negative for rash. Neurological: Negative for headaches    ____________________________________________   PHYSICAL EXAM:  VITAL SIGNS: ED Triage Vitals  Enc Vitals Group     BP 11/20/19 1429 (!) 135/93     Pulse Rate 11/20/19 1429 79     Resp 11/20/19 1429 18     Temp 11/20/19 1429 98.2 F (36.8 C)     Temp src --      SpO2 11/20/19 1429 100 %     Weight 11/20/19 1428 97.1 kg (214 lb)     Height 11/20/19 1428  1.676 m (5\' 6" )     Head Circumference --      Peak Flow --      Pain Score 11/20/19 1428 7     Pain Loc --      Pain Edu? --      Excl. in [place]? --     Constitutional: Alert and oriented.   Nose: No congestion/rhinnorhea. Mouth/Throat: Mucous membranes are moist.    Cardiovascular: Normal rate, regular rhythm. Grossly normal heart sounds.  Good peripheral circulation. Respiratory: Normal respiratory effort.  No retractions. Lungs CTAB. Gastrointestinal: Soft, mild tenderness to palpation left lower quadrant, no distention, no CVA tenderness  Musculoskeletal:Warm and well perfused Neurologic:  Normal speech and language. No gross focal neurologic deficits are appreciated.  Skin:  Skin is warm, dry and intact. No rash noted. Psychiatric: Mood and affect are normal. Speech and behavior are normal.  ____________________________________________   LABS (all labs ordered are listed, but only abnormal results are displayed)  Labs Reviewed  COMPREHENSIVE METABOLIC PANEL - Abnormal; Notable for the following components:      Result Value   Potassium 3.4 (*)    All other components within normal limits  URINALYSIS, COMPLETE (UACMP) WITH MICROSCOPIC - Abnormal; Notable for the following components:   Color, Urine STRAW (*)    APPearance CLEAR (*)    Bacteria, UA RARE (*)    All other components within normal limits  LIPASE, BLOOD  CBC  POC URINE PREG, ED   ____________________________________________  EKG  None ____________________________________________  RADIOLOGY  CT abdomen pelvis ____________________________________________   PROCEDURES  Procedure(s) performed: No  Procedures   Critical Care performed: No ____________________________________________   INITIAL IMPRESSION / ASSESSMENT AND PLAN / ED COURSE  Pertinent labs & imaging results that were available during my care of the patient were reviewed by me and considered in my medical decision making (see  chart for details).  Patient presents with left lower quadrant abdominal pain.  Differential includes ureterolithiasis, urinary tract infection, diverticulitis/colitis.  Lab work is unremarkable, white blood cell count is normal, chemistries are normal.  Urinalysis negative for hematuria or infection.  Will obtain CT abdomen pelvis, give IV Toradol, IV Zofran and reevaluate.  CT abdomen pelvis is unremarkable, no acute abnormalities.  Patient is feeling improved after treatment, will prescribe analgesics, nausea medication and have her follow-up with GI for further evaluation of her left lower quadrant abdominal discomfort    ____________________________________________   FINAL CLINICAL IMPRESSION(S) / ED DIAGNOSES  Final diagnoses:  Left lower quadrant abdominal pain        Note:  This document was prepared using Dragon voice recognition software and may include unintentional dictation errors.   Lavonia Drafts, MD 11/20/19 1726

## 2019-11-20 NOTE — ED Triage Notes (Signed)
Pt comes via POV from home with c/o LLQ pain. Pt states this has been going on for over a month but has since gotten worse.  Pt states some nausea. No vomiting or diarrhea.  Pt states decreased food intake.

## 2019-11-20 NOTE — ED Notes (Signed)
Transported to CT scan

## 2019-12-24 ENCOUNTER — Ambulatory Visit: Admitting: Gastroenterology

## 2020-01-03 ENCOUNTER — Other Ambulatory Visit: Payer: Self-pay

## 2020-01-03 ENCOUNTER — Emergency Department
Admission: EM | Admit: 2020-01-03 | Discharge: 2020-01-03 | Disposition: A | Discharge status: Home / Self Care | Attending: Emergency Medicine | Admitting: Emergency Medicine

## 2020-01-03 DIAGNOSIS — U071 COVID-19: Secondary | ICD-10-CM | POA: Insufficient documentation

## 2020-01-03 DIAGNOSIS — M7918 Myalgia, other site: Secondary | ICD-10-CM | POA: Diagnosis not present

## 2020-01-03 DIAGNOSIS — R5383 Other fatigue: Secondary | ICD-10-CM | POA: Diagnosis present

## 2020-01-03 DIAGNOSIS — R509 Fever, unspecified: Secondary | ICD-10-CM | POA: Diagnosis not present

## 2020-01-03 DIAGNOSIS — R05 Cough: Secondary | ICD-10-CM | POA: Diagnosis not present

## 2020-01-03 DIAGNOSIS — Z79899 Other long term (current) drug therapy: Secondary | ICD-10-CM | POA: Diagnosis not present

## 2020-01-03 DIAGNOSIS — R531 Weakness: Secondary | ICD-10-CM | POA: Diagnosis not present

## 2020-01-03 LAB — BASIC METABOLIC PANEL
Anion gap: 8 (ref 5–15)
BUN: 10 mg/dL (ref 6–20)
CO2: 25 mmol/L (ref 22–32)
Calcium: 9.1 mg/dL (ref 8.9–10.3)
Chloride: 106 mmol/L (ref 98–111)
Creatinine, Ser: 0.77 mg/dL (ref 0.44–1.00)
GFR calc Af Amer: >60 mL/min (ref >60)
GFR calc non Af Amer: >60 mL/min (ref >60)
Glucose, Bld: 94 mg/dL (ref 70–99)
Potassium: 3.6 mmol/L (ref 3.5–5.1)
Sodium: 139 mmol/L (ref 135–145)

## 2020-01-03 LAB — CBC
HCT: 40.5 % (ref 36.0–46.0)
Hemoglobin: 13.9 g/dL (ref 12.0–15.0)
MCH: 29.4 pg (ref 26.0–34.0)
MCHC: 34.3 g/dL (ref 30.0–36.0)
MCV: 85.6 fL (ref 80.0–100.0)
Platelets: 147 10*3/uL — ABNORMAL LOW (ref 150–400)
RBC: 4.73 MIL/uL (ref 3.87–5.11)
RDW: 12.3 % (ref 11.5–15.5)
WBC: 3.7 10*3/uL — ABNORMAL LOW (ref 4.0–10.5)
nRBC: 0 % (ref 0.0–0.2)

## 2020-01-03 LAB — URINALYSIS, COMPLETE (UACMP) WITH MICROSCOPIC
Bilirubin Urine: NEGATIVE
Glucose, UA: NEGATIVE mg/dL
Hgb urine dipstick: NEGATIVE
Ketones, ur: NEGATIVE mg/dL
Nitrite: NEGATIVE
Protein, ur: NEGATIVE mg/dL
Specific Gravity, Urine: 1.028 (ref 1.005–1.030)
pH: 5 (ref 5.0–8.0)

## 2020-01-03 LAB — GLUCOSE, CAPILLARY: Glucose-Capillary: 73 mg/dL (ref 70–99)

## 2020-01-03 LAB — POC SARS CORONAVIRUS 2 AG: SARS Coronavirus 2 Ag: POSITIVE — AB

## 2020-01-03 LAB — POCT PREGNANCY, URINE: Preg Test, Ur: NEGATIVE

## 2020-01-03 MED ORDER — PREDNISONE 10 MG PO TABS: 10.0000 mg | ORAL_TABLET | Freq: Every day | ORAL | 0 refills | Status: DC

## 2020-01-03 MED ORDER — ONDANSETRON 4 MG PO TBDP: 4.0000 mg | ORAL_TABLET | Freq: Three times a day (TID) | ORAL | 0 refills | Status: DC | PRN

## 2020-01-03 MED ORDER — GUAIFENESIN-CODEINE 100-10 MG/5ML PO SOLN: 5.0000 mL | Freq: Four times a day (QID) | ORAL | 0 refills | Status: DC | PRN

## 2020-01-03 NOTE — Discharge Instructions (Signed)
Please drink plenty of fluids and obtain plenty of rest.  Please isolate/quarantine for the next 10 days the last 3 of which need to be symptom-free including fever free.  Please have your family isolate for the next 10 days as well.

## 2020-01-03 NOTE — ED Provider Notes (Signed)
[facility] Emergency Department Provider Note  Time seen: 7:55 PM  I have reviewed the triage vital signs and the nursing notes.   HISTORY  Chief Complaint Near Syncope and Diarrhea   HPI April Tran is a 29 y.o. female with no past medical history presents to the emergency department for 1 week of general fatigue weakness intermittent fevers cough body aches and sore throat.   Patient states she is here with her stepfather who has similar symptoms.  They do not live together but each other multiple times per week.  Patient states cough but denies any significant shortness of breath.  No abdominal pain but does state diarrhea.  Has been feeling lightheaded.  Past Medical History:  Diagnosis Date  . Depression     Patient Active Problem List   Diagnosis Date Noted  . Gestational hypertension, third trimester 12/09/2016  . Post-dates pregnancy 12/09/2016  . Pregnancy 12/03/2016  . Indication for care in labor and delivery, antepartum 11/26/2016  . Labor and delivery, indication for care 11/07/2016  . Uterine contractions during pregnancy 10/15/2016  . Fall at home 07/26/2016    Past Surgical History:  Procedure Laterality Date  . TONSILLECTOMY    . TUBAL LIGATION Bilateral 12/10/2016   Procedure: BILATERAL TUBAL LIGATION;  Surgeon: Honor Loh Ward, MD;  Location: [facility] ORS;  Service: Gynecology;  Laterality: Bilateral;  . WISDOM TOOTH EXTRACTION      Prior to Admission medications   Medication Sig Start Date End Date Taking? Authorizing Provider  fluticasone (FLONASE) 50 MCG/ACT nasal spray Place 2 sprays into both nostrils daily. 04/04/17 04/04/18  Johnn Hai, PA-C  ibuprofen (ADVIL,MOTRIN) 800 MG tablet Take 1 tablet (800 mg total) by mouth every 8 (eight) hours as needed for mild pain or moderate pain (with food). 02/20/18   Eula Listen, MD  meloxicam (MOBIC) 15 MG tablet Take 1 tablet (15 mg total) by mouth daily. 09/12/18    Cuthriell, Charline Bills, PA-C  ondansetron (ZOFRAN ODT) 4 MG disintegrating tablet Take 1 tablet (4 mg total) by mouth every 8 (eight) hours as needed. 11/20/19   Lavonia Drafts, MD  sertraline (ZOLOFT) 25 MG tablet Take 25 mg by mouth daily.    [provider]  traMADol (ULTRAM) 50 MG tablet Take 1 tablet (50 mg total) by mouth every 6 (six) hours as needed. 11/20/19 11/19/20  Lavonia Drafts, MD    Allergies  Allergen Reactions  . Morphine And Related Hypertension    Family History  Problem Relation Age of Onset  . Hypertension Mother   . Depression Mother   . Hypertension Father   . Diabetes Father   . Depression Father   . Hypertension Maternal Aunt   . Depression Maternal Aunt   . Hypertension Paternal Aunt   . Hypertension Maternal Grandmother   . Depression Maternal Grandmother   . Hypertension Maternal Grandfather   . Depression Maternal Grandfather   . Heart disease Maternal Grandfather   . Hypertension Paternal Grandmother   . Depression Paternal Grandmother   . Hypertension Paternal Grandfather   . Depression Paternal Grandfather     Social History Social History   Tobacco Use  . Smoking status: Never Smoker  . Smokeless tobacco: Never Used  Substance Use Topics  . Alcohol use: No  . Drug use: No    Review of Systems Constitutional: Intermittent fevers.  Positive for body aches. Cardiovascular: Negative for chest pain. Respiratory: Negative for shortness of breath. Gastrointestinal: Negative for abdominal  pain.  Positive for diarrhea.  Positive for nausea. Genitourinary: Negative for urinary compaints Musculoskeletal: Body aches. Neurological: Negative for headache All other ROS negative  ____________________________________________   PHYSICAL EXAM:  VITAL SIGNS: ED Triage Vitals  Enc Vitals Group     BP 01/03/20 1751 (!) 135/95     Pulse Rate 01/03/20 1743 86     Resp 01/03/20 1743 18     Temp 01/03/20 1743 98.4 F (36.9 C)     Temp Source  01/03/20 1743 Oral     SpO2 01/03/20 1743 100 %     Weight 01/03/20 1752 209 lb (94.8 kg)     Height 01/03/20 1752 5\' 6"  (1.676 m)     Head Circumference --      Peak Flow --      Pain Score 01/03/20 1751 7     Pain Loc --      Pain Edu? --      Excl. in [place]? --    Constitutional: Alert and oriented. Well appearing and in no distress. Eyes: Normal exam ENT      Head: Normocephalic and atraumatic.      Nose: Mild nasal congestion/rhinorrhea      Mouth/Throat: Mucous membranes are moist. Cardiovascular: Normal rate, regular rhythm.  Respiratory: Normal respiratory effort without tachypnea nor retractions. Breath sounds are clear  Gastrointestinal: Soft and nontender. No distention.   Musculoskeletal: Nontender with normal range of motion in all extremities.  Neurologic:  Normal speech and language. No gross focal neurologic deficits  Skin:  Skin is warm, dry and intact.  Psychiatric: Mood and affect are normal.   ____________________________________________    EKG  EKG viewed and interpreted by myself shows a normal sinus rhythm at 94 bpm with a narrow QRS, normal axis, normal intervals, no concerning ST changes  ____________________________________________  INITIAL IMPRESSION / ASSESSMENT AND PLAN / ED COURSE  Pertinent labs & imaging results that were available during my care of the patient were reviewed by me and considered in my medical decision making (see chart for details).   Patient presents to the emergency department for 1 week of dizziness, weakness, cough, body aches, intermittent fevers.  Patient here with her stepfather with similar symptoms.  Symptoms are highly suggestive of viral infection.  We will swab for Covid.  Patient's blood work and urinalysis largely within normal limits.  Rapid Covid is positive.  We will discharge with cough medication, nausea medication and a prednisone taper as the patient has been approximately 7 days of symptoms.  Discussed Covid  return precautions.  Patient agreeable to plan of care.  Also discussed quarantine for herself and family.  April Tran [birth year] was evaluated in Emergency Department on 01/03/2020 for the symptoms described in the history of present illness. She was evaluated in the context of the global COVID-19 pandemic, which necessitated consideration that the patient might be at risk for infection with the SARS-CoV-2 virus that causes COVID-19. Institutional protocols and algorithms that pertain to the evaluation of patients at risk for COVID-19 are in a state of rapid change based on information released by regulatory bodies including the CDC and federal and state organizations. These policies and algorithms were followed during the patient's care in the ED.  ____________________________________________   FINAL CLINICAL IMPRESSION(S) / ED DIAGNOSES  COVID-19   01/05/2020, MD 01/03/20 [phone]

## 2020-01-03 NOTE — ED Triage Notes (Addendum)
Pt arrives via pov from home. Pt reports she started experiencing fever, nausea, diarrhea on Saturday. Pt  states she has been taking several otc drugs without any relief.  Pt states " I feel like im in space", and reports feeling like she was going to pass out when walking into ED. Pt a&o x 4, ambulatory to triage. NAD noted at this time. Pt states tested for covid Monday but has not received results.

## 2020-01-08 ENCOUNTER — Encounter: Payer: Self-pay | Admitting: Emergency Medicine

## 2020-01-08 ENCOUNTER — Other Ambulatory Visit: Payer: Self-pay

## 2020-01-08 ENCOUNTER — Emergency Department
Admission: EM | Admit: 2020-01-08 | Discharge: 2020-01-08 | Disposition: A | Discharge status: Home / Self Care | Attending: Emergency Medicine | Admitting: Emergency Medicine

## 2020-01-08 ENCOUNTER — Emergency Department

## 2020-01-08 DIAGNOSIS — Z79899 Other long term (current) drug therapy: Secondary | ICD-10-CM | POA: Insufficient documentation

## 2020-01-08 DIAGNOSIS — R059 Cough, unspecified: Secondary | ICD-10-CM

## 2020-01-08 DIAGNOSIS — U071 COVID-19: Secondary | ICD-10-CM | POA: Insufficient documentation

## 2020-01-08 DIAGNOSIS — R0602 Shortness of breath: Secondary | ICD-10-CM | POA: Diagnosis present

## 2020-01-08 DIAGNOSIS — R05 Cough: Secondary | ICD-10-CM

## 2020-01-08 LAB — COMPREHENSIVE METABOLIC PANEL
ALT: 19 U/L (ref 0–44)
AST: 17 U/L (ref 15–41)
Albumin: 4.5 g/dL (ref 3.5–5.0)
Alkaline Phosphatase: 64 U/L (ref 38–126)
Anion gap: 8 (ref 5–15)
BUN: 11 mg/dL (ref 6–20)
CO2: 27 mmol/L (ref 22–32)
Calcium: 9.2 mg/dL (ref 8.9–10.3)
Chloride: 104 mmol/L (ref 98–111)
Creatinine, Ser: 0.85 mg/dL (ref 0.44–1.00)
GFR calc Af Amer: >60 mL/min (ref >60)
GFR calc non Af Amer: >60 mL/min (ref >60)
Glucose, Bld: 84 mg/dL (ref 70–99)
Potassium: 3.5 mmol/L (ref 3.5–5.1)
Sodium: 139 mmol/L (ref 135–145)
Total Bilirubin: 0.8 mg/dL (ref 0.3–1.2)
Total Protein: 8.1 g/dL (ref 6.5–8.1)

## 2020-01-08 LAB — CBC
HCT: 45.7 % (ref 36.0–46.0)
Hemoglobin: 15.5 g/dL — ABNORMAL HIGH (ref 12.0–15.0)
MCH: 29.7 pg (ref 26.0–34.0)
MCHC: 33.9 g/dL (ref 30.0–36.0)
MCV: 87.5 fL (ref 80.0–100.0)
Platelets: 168 10*3/uL (ref 150–400)
RBC: 5.22 MIL/uL — ABNORMAL HIGH (ref 3.87–5.11)
RDW: 12.2 % (ref 11.5–15.5)
WBC: 6.3 10*3/uL (ref 4.0–10.5)
nRBC: 0 % (ref 0.0–0.2)

## 2020-01-08 LAB — TROPONIN I (HIGH SENSITIVITY): Troponin I (High Sensitivity): <2 ng/L (ref <18)

## 2020-01-08 MED ORDER — ONDANSETRON HCL 4 MG/2ML IJ SOLN
4.0000 mg | Freq: Once | INTRAMUSCULAR | Status: AC
  Administered 2020-01-08: 4 mg via INTRAVENOUS
  Filled 2020-01-08: qty 2

## 2020-01-08 MED ORDER — GUAIFENESIN-CODEINE 100-10 MG/5ML PO SOLN: 5.0000 mL | Freq: Four times a day (QID) | ORAL | 0 refills | Status: DC | PRN

## 2020-01-08 MED ORDER — BENZONATATE 100 MG PO CAPS
200.0000 mg | ORAL_CAPSULE | Freq: Once | ORAL | Status: AC
  Administered 2020-01-08: 13:00:00 200 mg via ORAL
  Filled 2020-01-08: qty 2

## 2020-01-08 MED ORDER — GUAIFENESIN-CODEINE 100-10 MG/5ML PO SOLN: 5.0000 mL | Freq: Four times a day (QID) | ORAL | 0 refills | Status: AC | PRN

## 2020-01-08 MED ORDER — ACETAMINOPHEN 500 MG PO TABS
1000.0000 mg | ORAL_TABLET | Freq: Once | ORAL | Status: AC
  Administered 2020-01-08: 1000 mg via ORAL
  Filled 2020-01-08: qty 2

## 2020-01-08 MED ORDER — BENZONATATE 100 MG PO CAPS
100.0000 mg | ORAL_CAPSULE | Freq: Three times a day (TID) | ORAL | 0 refills | Status: DC | PRN
Start: 2020-01-08 — End: 2020-12-24

## 2020-01-08 MED ORDER — KETOROLAC TROMETHAMINE 30 MG/ML IJ SOLN
15.0000 mg | Freq: Once | INTRAMUSCULAR | Status: AC
  Administered 2020-01-08: 13:00:00 15 mg via INTRAVENOUS
  Filled 2020-01-08: qty 1

## 2020-01-08 MED ORDER — SODIUM CHLORIDE 0.9 % IV BOLUS
500.0000 mL | Freq: Once | INTRAVENOUS | Status: AC
  Administered 2020-01-08: 500 mL via INTRAVENOUS

## 2020-01-08 NOTE — Discharge Instructions (Addendum)
You were positive for COVID-19 and the symptoms are most likely secondary to that.  Continue taking Tylenol 1 g every 8 hours take the Tessalon Perles and codeine cough syrup to help with the cough.  Return to ER for worsening shortness of breath.       Person Under Monitoring Name: April Tran  Location: [address] [place] [address]   Infection Prevention Recommendations for Individuals Confirmed to have, or Being Evaluated for, 2019 Novel Coronavirus (COVID-19) Infection Who Receive Care at Home  Individuals who are confirmed to have, or are being evaluated for, COVID-19 should follow the prevention steps below until a healthcare provider or local or state health department says they can return to normal activities.  Stay home except to get medical care You should restrict activities outside your home, except for getting medical care. Do not go to work, school, or public areas, and do not use public transportation or taxis.  Call ahead before visiting your doctor Before your medical appointment, call the healthcare provider and tell them that you have, or are being evaluated for, COVID-19 infection. This will help the healthcare provider's office take steps to keep other people from getting infected. Ask your healthcare provider to call the local or state health department.  Monitor your symptoms Seek prompt medical attention if your illness is worsening (e.g., difficulty breathing). Before going to your medical appointment, call the healthcare provider and tell them that you have, or are being evaluated for, COVID-19 infection. Ask your healthcare provider to call the local or state health department.  Wear a facemask You should wear a facemask that covers your nose and mouth when you are in the same room with other people and when you visit a healthcare provider. People who live with or visit you should also wear a facemask while they are in the same room with  you.  Separate yourself from other people in your home As much as possible, you should stay in a different room from other people in your home. Also, you should use a separate bathroom, if available.  Avoid sharing household items You should not share dishes, drinking glasses, cups, eating utensils, towels, bedding, or other items with other people in your home. After using these items, you should wash them thoroughly with soap and water.  Cover your coughs and sneezes Cover your mouth and nose with a tissue when you cough or sneeze, or you can cough or sneeze into your sleeve. Throw used tissues in a lined trash can, and immediately wash your hands with soap and water for at least 20 seconds or use an alcohol-based hand rub.  Wash your [employer] your hands often and thoroughly with soap and water for at least 20 seconds. You can use an alcohol-based hand sanitizer if soap and water are not available and if your hands are not visibly dirty. Avoid touching your eyes, nose, and mouth with unwashed hands.   Prevention Steps for Caregivers and Household Members of Individuals Confirmed to have, or Being Evaluated for, COVID-19 Infection Being Cared for in the Home  If you live with, or provide care at home for, a person confirmed to have, or being evaluated for, COVID-19 infection please follow these guidelines to prevent infection:  Follow healthcare provider's instructions Make sure that you understand and can help the patient follow any healthcare provider instructions for all care.  Provide for the patient's basic needs You should help the patient with basic needs in  the home and provide support for getting groceries, prescriptions, and other personal needs.  Monitor the patient's symptoms If they are getting sicker, call his or her medical provider and tell them that the patient has, or is being evaluated for, COVID-19 infection. This will help the healthcare provider's office  take steps to keep other people from getting infected. Ask the healthcare provider to call the local or state health department.  Limit the number of people who have contact with the patient If possible, have only one caregiver for the patient. Other household members should stay in another home or place of residence. If this is not possible, they should stay in another room, or be separated from the patient as much as possible. Use a separate bathroom, if available. Restrict visitors who do not have an essential need to be in the home.  Keep older adults, very young children, and other sick people away from the patient Keep older adults, very young children, and those who have compromised immune systems or chronic health conditions away from the patient. This includes people with chronic heart, lung, or kidney conditions, diabetes, and cancer.  Ensure good ventilation Make sure that shared spaces in the home have good air flow, such as from an air conditioner or an opened window, weather permitting.  Wash your hands often Wash your hands often and thoroughly with soap and water for at least 20 seconds. You can use an alcohol based hand sanitizer if soap and water are not available and if your hands are not visibly dirty. Avoid touching your eyes, nose, and mouth with unwashed hands. Use disposable paper towels to dry your hands. If not available, use dedicated cloth towels and replace them when they become wet.  Wear a facemask and gloves Wear a disposable facemask at all times in the room and gloves when you touch or have contact with the patient's blood, body fluids, and/or secretions or excretions, such as sweat, saliva, sputum, nasal mucus, vomit, urine, or feces.  Ensure the mask fits over your nose and mouth tightly, and do not touch it during use. Throw out disposable facemasks and gloves after using them. Do not reuse. Wash your hands immediately after removing your facemask and  gloves. If your personal clothing becomes contaminated, carefully remove clothing and launder. Wash your hands after handling contaminated clothing. Place all used disposable facemasks, gloves, and other waste in a lined container before disposing them with other household waste. Remove gloves and wash your hands immediately after handling these items.  Do not share dishes, glasses, or other household items with the patient Avoid sharing household items. You should not share dishes, drinking glasses, cups, eating utensils, towels, bedding, or other items with a patient who is confirmed to have, or being evaluated for, COVID-19 infection. After the person uses these items, you should wash them thoroughly with soap and water.  Wash laundry thoroughly Immediately remove and wash clothes or bedding that have blood, body fluids, and/or secretions or excretions, such as sweat, saliva, sputum, nasal mucus, vomit, urine, or feces, on them. Wear gloves when handling laundry from the patient. Read and follow directions on labels of laundry or clothing items and detergent. In general, wash and dry with the warmest temperatures recommended on the label.  Clean all areas the individual has used often Clean all touchable surfaces, such as counters, tabletops, doorknobs, bathroom fixtures, toilets, phones, keyboards, tablets, and bedside tables, every day. Also, clean any surfaces that may have blood, body fluids,  and/or secretions or excretions on them. Wear gloves when cleaning surfaces the patient has come in contact with. Use a diluted bleach solution (e.g., dilute bleach with 1 part bleach and 10 parts water) or a household disinfectant with a label that says EPA-registered for coronaviruses. To make a bleach solution at home, add 1 tablespoon of bleach to 1 quart (4 cups) of water. For a larger supply, add  cup of bleach to 1 gallon (16 cups) of water. Read labels of cleaning products and follow  recommendations provided on product labels. Labels contain instructions for safe and effective use of the cleaning product including precautions you should take when applying the product, such as wearing gloves or eye protection and making sure you have good ventilation during use of the product. Remove gloves and wash hands immediately after cleaning.  Monitor yourself for signs and symptoms of illness Caregivers and household members are considered close contacts, should monitor their health, and will be asked to limit movement outside of the home to the extent possible. Follow the monitoring steps for close contacts listed on the symptom monitoring form.   ? If you have additional questions, contact your local health department or call the epidemiologist on call at [phone] (available 24/7). ? This guidance is subject to change. For the most up-to-date guidance from [facility], please refer to their website: [email or web address]

## 2020-01-08 NOTE — ED Provider Notes (Signed)
[facility] Emergency Department Provider Note  ____________________________________________   First MD Initiated Contact with Patient 01/08/20 1201     (approximate)  I have reviewed the triage vital signs and the nursing notes.   HISTORY  Chief Complaint Shortness of Breath and Cough    HPI April Tran is a 29 y.o. female with depression who was positive for coronavirus on 4/22 who comes in for shortness of breath.  Patient stated that she started having symptoms on 4/20.  She is about 7 days in.  Patient reported yesterday she developed worsening cough and shortness of breath.  Patient stated that the shortness of breath is worse at nighttime and that she will have coughing fits and feels like she cannot catch her breath.  This is severe, intermittent, not better with the coughing codeine syrup.  She also reports some chest discomfort that started yesterday.Marland Kitchen  Has not been taking Tylenol or ibuprofen.  She denies any abdominal pain, dysuria.  Is currently menstruating.  She denies any other risk factors for PE.  Patient reports she is currently menstruating and has no concern for pregnancy.          Past Medical History:  Diagnosis Date  . Depression     Patient Active Problem List   Diagnosis Date Noted  . Gestational hypertension, third trimester 12/09/2016  . Post-dates pregnancy 12/09/2016  . Pregnancy 12/03/2016  . Indication for care in labor and delivery, antepartum 11/26/2016  . Labor and delivery, indication for care 11/07/2016  . Uterine contractions during pregnancy 10/15/2016  . Fall at home 07/26/2016    Past Surgical History:  Procedure Laterality Date  . TONSILLECTOMY    . TUBAL LIGATION Bilateral 12/10/2016   Procedure: BILATERAL TUBAL LIGATION;  Surgeon: Elenora Fender Ward, MD;  Location: [facility] ORS;  Service: Gynecology;  Laterality: Bilateral;  . WISDOM TOOTH EXTRACTION      Prior to Admission medications   Medication  Sig Start Date End Date Taking? Authorizing Provider  fluticasone (FLONASE) 50 MCG/ACT nasal spray Place 2 sprays into both nostrils daily. 04/04/17 04/04/18  Tommi Rumps, PA-C  guaiFENesin-codeine 100-10 MG/5ML syrup Take 5 mLs by mouth every 6 (six) hours as needed for cough. 01/03/20   Minna Antis, MD  ibuprofen (ADVIL,MOTRIN) 800 MG tablet Take 1 tablet (800 mg total) by mouth every 8 (eight) hours as needed for mild pain or moderate pain (with food). 02/20/18   Rockne Menghini, MD  meloxicam (MOBIC) 15 MG tablet Take 1 tablet (15 mg total) by mouth daily. 09/12/18   Cuthriell, Delorise Royals, PA-C  ondansetron (ZOFRAN ODT) 4 MG disintegrating tablet Take 1 tablet (4 mg total) by mouth every 8 (eight) hours as needed for nausea or vomiting. 01/03/20   Minna Antis, MD  predniSONE (DELTASONE) 10 MG tablet Take 1 tablet (10 mg total) by mouth daily. Day 1-3: take 4 tablets PO daily Day 4-6: take 3 tablets PO daily Day 7-9: take 2 tablets PO daily Day 10-12: take 1 tablet PO daily 01/03/20   Minna Antis, MD  sertraline (ZOLOFT) 25 MG tablet Take 25 mg by mouth daily.    [provider]  traMADol (ULTRAM) 50 MG tablet Take 1 tablet (50 mg total) by mouth every 6 (six) hours as needed. 11/20/19 11/19/20  Jene Every, MD    Allergies Morphine and related  Family History  Problem Relation Age of Onset  . Hypertension Mother   . Depression Mother   . Hypertension  Father   . Diabetes Father   . Depression Father   . Hypertension Maternal Aunt   . Depression Maternal Aunt   . Hypertension Paternal Aunt   . Hypertension Maternal Grandmother   . Depression Maternal Grandmother   . Hypertension Maternal Grandfather   . Depression Maternal Grandfather   . Heart disease Maternal Grandfather   . Hypertension Paternal Grandmother   . Depression Paternal Grandmother   . Hypertension Paternal Grandfather   . Depression Paternal Grandfather     Social  History Social History   Tobacco Use  . Smoking status: Never Smoker  . Smokeless tobacco: Never Used  Substance Use Topics  . Alcohol use: No  . Drug use: No      Review of Systems Constitutional: No fever/chills Eyes: No visual changes. ENT: No sore throat. Cardiovascular: Denies chest pain. Respiratory: Positive shortness of breath, cough Gastrointestinal: No abdominal pain.  No nausea, no vomiting.  No diarrhea.  No constipation.  Loss of taste Genitourinary: Negative for dysuria. Musculoskeletal: Negative for back pain. Skin: Negative for rash. Neurological: Negative for headaches, focal weakness or numbness. All other ROS negative ____________________________________________   PHYSICAL EXAM:  VITAL SIGNS: ED Triage Vitals  Enc Vitals Group     BP 01/08/20 1123 (!) 143/96     Pulse Rate 01/08/20 1123 85     Resp 01/08/20 1123 16     Temp 01/08/20 1123 98.3 F (36.8 C)     Temp Source 01/08/20 1123 Oral     SpO2 01/08/20 1123 99 %     Weight 01/08/20 1124 208 lb 15.9 oz (94.8 kg)     Height 01/08/20 1124 5\' 6"  (1.676 m)     Head Circumference --      Peak Flow --      Pain Score 01/08/20 1123 6     Pain Loc --      Pain Edu? --      Excl. in GC? --     Constitutional: Alert and oriented. Well appearing and in no acute distress. Eyes: Conjunctivae are normal. EOMI. Head: Atraumatic. Nose: No congestion/rhinnorhea. Mouth/Throat: Mucous membranes are moist.   Neck: No stridor. Trachea Midline. FROM Cardiovascular: Tachycardic initially but upon my assessment patient was normal rate, regular rhythm. Grossly normal heart sounds.  Good peripheral circulation. Respiratory: Normal respiratory effort.  No retractions. Lungs CTAB. Gastrointestinal: Soft and nontender. No distention. No abdominal bruits.  Musculoskeletal: No lower extremity tenderness nor edema.  No joint effusions. Neurologic:  Normal speech and language. No gross focal neurologic deficits are  appreciated.  Skin:  Skin is warm, dry and intact. No rash noted. Psychiatric: Mood and affect are normal. Speech and behavior are normal. GU: Deferred   ____________________________________________   LABS (all labs ordered are listed, but only abnormal results are displayed)  Labs Reviewed  CBC - Abnormal; Notable for the following components:      Result Value   RBC 5.22 (*)    Hemoglobin 15.5 (*)    All other components within normal limits  COMPREHENSIVE METABOLIC PANEL  POC URINE PREG, ED  TROPONIN I (HIGH SENSITIVITY)   ____________________________________________   ED ECG REPORT I, 01/10/20, the attending physician, personally viewed and interpreted this ECG.  EKG is normal sinus rate of 80, no ST elevation, T wave inversion in lead III, normal intervals ____________________________________________  RADIOLOGY April Tran, personally viewed and evaluated these images (plain radiographs) as part of my medical decision making, as  well as reviewing the written report by the radiologist.  ED MD interpretation: No pneumothorax or focal pneumonia. Official radiology report(s): DG Chest Portable 1 View  Result Date: 01/08/2020 CLINICAL DATA:  COVID positive, shortness of breath. EXAM: PORTABLE CHEST 1 VIEW COMPARISON:  Chest x-ray dated 12/14/2015. FINDINGS: The heart size and mediastinal contours are within normal limits. Both lungs are clear. The visualized skeletal structures are unremarkable. IMPRESSION: No active disease.  No evidence of pneumonia. Electronically Signed   By: Bary Richard M.D.   On: 01/08/2020 12:28    ____________________________________________   PROCEDURES  Procedure(s) performed (including Critical Care):  Procedures   ____________________________________________   INITIAL IMPRESSION / ASSESSMENT AND PLAN / ED COURSE  April Tran was evaluated in Emergency Department on 01/08/2020 for the symptoms described in the history  of present illness. She was evaluated in the context of the global COVID-19 pandemic, which necessitated consideration that the patient might be at risk for infection with the SARS-CoV-2 virus that causes COVID-19. Institutional protocols and algorithms that pertain to the evaluation of patients at risk for COVID-19 are in a state of rapid change based on information released by regulatory bodies including the CDC and federal and state organizations. These policies and algorithms were followed during the patient's care in the ED.    Patient is a well-appearing 29 year old who comes in slightly tachycardic with 7 days into her coronavirus diagnosis.  Patient is well-appearing and lungs sound clear.  I suspect that this is more likely related to COVID-19 pneumonia.  I have lower suspicion for PE given this is in the expected course for Covid denies any other risk factors for PE.  At this time D-dimer would not be accurate given will most likely be positive from her having Covid and I do not think that my suspicion is high enough to do a CT scan.  We will get labs to evaluate for electrolyte abnormalities, chest x-ray to make sure no pneumothorax or focal pneumonia.  No abdominal pain to suggest abdominal infection.  Labs are reassuring.  Evidence of electrolyte abnormalities.  Cardiac marker rules out for ACS.  Chest x-ray no evidence of pneumothorax or focal pneumonia.  Patient was already given steroids, cough codeine syrup and Zofran upon discharge 5 days ago.  Discussed with patient that that is typically what I would also prescribe for COVID-19 and that unfortunately just takes more time and making sure that her oxygen levels are dropping low that would require admission.  We will give patient some Tylenol, Toradol, fluids, Tessalon Perles, IV Zofran to see if we can get her feeling better and will attempt to ambulate patient to make sure no evidence of desaturation.  2:48 PM heart has come down after  fluids.  Patient ambulatory with oxygen levels of 100%.  Patient stating that she feels better.  Patient requesting some additional cough syrup and stated that the Tessalon Perles seem to help.  We discussed also using Tylenol for her pain.  We discussed return to ER if develops worsening shortness of breath.  I discussed the provisional nature of ED diagnosis, the treatment so far, the ongoing plan of care, follow up appointments and return precautions with the patient and any family or support people present. They expressed understanding and agreed with the plan, discharged home.     ____________________________________________   FINAL CLINICAL IMPRESSION(S) / ED DIAGNOSES   Final diagnoses:  None      MEDICATIONS GIVEN DURING THIS VISIT:  Medications -  No data to display   ED Discharge Orders    None       Note:  This document was prepared using Dragon voice recognition software and may include unintentional dictation errors.   Vanessa [place], MD 01/08/20 1459

## 2020-01-08 NOTE — ED Triage Notes (Signed)
Says she has covid and feels short of breath.   Says cough, which is evident in triage.  She says she has been unable to sleep--due to cough and feeling like she is going to suffocate.

## 2020-01-08 NOTE — ED Notes (Signed)
Pt ambulated in room, O2 sat >100%

## 2020-09-24 DIAGNOSIS — Z20822 Contact with and (suspected) exposure to covid-19: Secondary | ICD-10-CM | POA: Diagnosis not present

## 2020-12-16 ENCOUNTER — Emergency Department: Payer: Medicaid Other

## 2020-12-16 ENCOUNTER — Other Ambulatory Visit: Payer: Self-pay

## 2020-12-16 ENCOUNTER — Emergency Department
Admission: EM | Admit: 2020-12-16 | Discharge: 2020-12-16 | Disposition: A | Discharge status: Home / Self Care | Payer: Medicaid Other | Attending: Emergency Medicine | Admitting: Emergency Medicine

## 2020-12-16 DIAGNOSIS — R1011 Right upper quadrant pain: Secondary | ICD-10-CM

## 2020-12-16 DIAGNOSIS — Z20822 Contact with and (suspected) exposure to covid-19: Secondary | ICD-10-CM | POA: Insufficient documentation

## 2020-12-16 DIAGNOSIS — K8020 Calculus of gallbladder without cholecystitis without obstruction: Secondary | ICD-10-CM

## 2020-12-16 DIAGNOSIS — M545 Low back pain, unspecified: Secondary | ICD-10-CM | POA: Insufficient documentation

## 2020-12-16 LAB — CBC
HCT: 40.5 % (ref 36.0–46.0)
Hemoglobin: 13.7 g/dL (ref 12.0–15.0)
MCH: 29.3 pg (ref 26.0–34.0)
MCHC: 33.8 g/dL (ref 30.0–36.0)
MCV: 86.7 fL (ref 80.0–100.0)
Platelets: 194 10*3/uL (ref 150–400)
RBC: 4.67 MIL/uL (ref 3.87–5.11)
RDW: 12.1 % (ref 11.5–15.5)
WBC: 7.4 10*3/uL (ref 4.0–10.5)
nRBC: 0 % (ref 0.0–0.2)

## 2020-12-16 LAB — URINALYSIS, COMPLETE (UACMP) WITH MICROSCOPIC
Bacteria, UA: NONE SEEN
Bilirubin Urine: NEGATIVE
Glucose, UA: NEGATIVE mg/dL
Hgb urine dipstick: NEGATIVE
Ketones, ur: NEGATIVE mg/dL
Leukocytes,Ua: NEGATIVE
Nitrite: NEGATIVE
Protein, ur: NEGATIVE mg/dL
Specific Gravity, Urine: 1.018 (ref 1.005–1.030)
pH: 6 (ref 5.0–8.0)

## 2020-12-16 LAB — COMPREHENSIVE METABOLIC PANEL
ALT: 15 U/L (ref 0–44)
AST: 22 U/L (ref 15–41)
Albumin: 4.4 g/dL (ref 3.5–5.0)
Alkaline Phosphatase: 74 U/L (ref 38–126)
Anion gap: 10 (ref 5–15)
BUN: 13 mg/dL (ref 6–20)
CO2: 26 mmol/L (ref 22–32)
Calcium: 9.6 mg/dL (ref 8.9–10.3)
Chloride: 101 mmol/L (ref 98–111)
Creatinine, Ser: 0.98 mg/dL (ref 0.44–1.00)
GFR, Estimated: >60 mL/min (ref >60)
Glucose, Bld: 112 mg/dL — ABNORMAL HIGH (ref 70–99)
Potassium: 3.5 mmol/L (ref 3.5–5.1)
Sodium: 137 mmol/L (ref 135–145)
Total Bilirubin: 0.8 mg/dL (ref 0.3–1.2)
Total Protein: 7.9 g/dL (ref 6.5–8.1)

## 2020-12-16 LAB — PREGNANCY, URINE: Preg Test, Ur: NEGATIVE

## 2020-12-16 LAB — RESP PANEL BY RT-PCR (FLU A&B, COVID) ARPGX2
Influenza A by PCR: NEGATIVE
Influenza B by PCR: NEGATIVE
SARS Coronavirus 2 by RT PCR: NEGATIVE

## 2020-12-16 LAB — LIPASE, BLOOD: Lipase: 33 U/L (ref 11–51)

## 2020-12-16 MED ORDER — ONDANSETRON HCL 4 MG PO TABS: 4.0000 mg | ORAL_TABLET | Freq: Three times a day (TID) | ORAL | 0 refills | Status: DC | PRN

## 2020-12-16 MED ORDER — ONDANSETRON 4 MG PO TBDP
4.0000 mg | ORAL_TABLET | Freq: Once | ORAL | Status: AC
  Administered 2020-12-16: 4 mg via ORAL
  Filled 2020-12-16: qty 1

## 2020-12-16 MED ORDER — OXYCODONE-ACETAMINOPHEN 5-325 MG PO TABS: 1.0000 | ORAL_TABLET | Freq: Three times a day (TID) | ORAL | 0 refills | Status: AC | PRN

## 2020-12-16 NOTE — ED Triage Notes (Signed)
Pt c/o upper abd pain for the past 3 days with yellow to white colored stools, denies vomiting.

## 2020-12-16 NOTE — ED Notes (Signed)
See triage note, pt reports generalized abdominal pain x 3 days. +nausea.  Skin color WDL. Alert and oriented. Clear skin.

## 2020-12-16 NOTE — ED Notes (Signed)
Pt in CT.

## 2020-12-16 NOTE — ED Provider Notes (Signed)
[facility] Emergency Department Provider Note  ____________________________________________   Event Date/Time   First MD Initiated Contact with Patient 12/16/20 1721     (approximate)  I have reviewed the triage vital signs and the nursing notes.   HISTORY  Chief Complaint Abdominal Pain   HPI April Tran is a 30 y.o. female with a past medical history of depression who presents for approximately 3 days of some intermittent upper and right-sided abdominal pain associate with some nausea.  Patient states pain is been taking her breath away.  She also states she has had a little discomfort in her left lower back over the last couple of days but is not sure if this is related.  She denies any headache and earache, sore throat, fevers, chills, cough, chest pain, other back pain, urinary symptoms or diarrhea but does state her stools have seemed slightly looser than usual and have been much more light-colored than usual varying between yellow to white.  No rash or recent trauma.  No significant NSAID use or EtOH use.  No prior similar episodes.  No clearly fitting aggravating factors other than food which might be a little worse.         Past Medical History:  Diagnosis Date  . Depression     Patient Active Problem List   Diagnosis Date Noted  . Gestational hypertension, third trimester 12/09/2016  . Post-dates pregnancy 12/09/2016  . Pregnancy 12/03/2016  . Indication for care in labor and delivery, antepartum 11/26/2016  . Labor and delivery, indication for care 11/07/2016  . Uterine contractions during pregnancy 10/15/2016  . Fall at home 07/26/2016    Past Surgical History:  Procedure Laterality Date  . TONSILLECTOMY    . TUBAL LIGATION Bilateral 12/10/2016   Procedure: BILATERAL TUBAL LIGATION;  Surgeon: Elenora Fender Ward, MD;  Location: [facility] ORS;  Service: Gynecology;  Laterality: Bilateral;  . WISDOM TOOTH EXTRACTION      Prior to  Admission medications   Medication Sig Start Date End Date Taking? Authorizing Provider  ondansetron (ZOFRAN) 4 MG tablet Take 1 tablet (4 mg total) by mouth every 8 (eight) hours as needed for up to 10 doses for nausea or vomiting. 12/16/20  Yes Gilles Chiquito, MD  oxyCODONE-acetaminophen (PERCOCET) 5-325 MG tablet Take 1 tablet by mouth every 8 (eight) hours as needed for up to 3 days for severe pain. 12/16/20 12/19/20 Yes Gilles Chiquito, MD  benzonatate (TESSALON PERLES) 100 MG capsule Take 1 capsule (100 mg total) by mouth 3 (three) times daily as needed for cough. 01/08/20 01/07/21  Concha Se, MD  ondansetron (ZOFRAN ODT) 4 MG disintegrating tablet Take 1 tablet (4 mg total) by mouth every 8 (eight) hours as needed for nausea or vomiting. 01/03/20   Minna Antis, MD  predniSONE (DELTASONE) 10 MG tablet Take 1 tablet (10 mg total) by mouth daily. Day 1-3: take 4 tablets PO daily Day 4-6: take 3 tablets PO daily Day 7-9: take 2 tablets PO daily Day 10-12: take 1 tablet PO daily 01/03/20   Minna Antis, MD  sertraline (ZOLOFT) 25 MG tablet Take 25 mg by mouth daily.    [provider]    Allergies Morphine and related  Family History  Problem Relation Age of Onset  . Hypertension Mother   . Depression Mother   . Hypertension Father   . Diabetes Father   . Depression Father   . Hypertension Maternal Aunt   . Depression Maternal  Aunt   . Hypertension Paternal Aunt   . Hypertension Maternal Grandmother   . Depression Maternal Grandmother   . Hypertension Maternal Grandfather   . Depression Maternal Grandfather   . Heart disease Maternal Grandfather   . Hypertension Paternal Grandmother   . Depression Paternal Grandmother   . Hypertension Paternal Grandfather   . Depression Paternal Grandfather     Social History Social History   Tobacco Use  . Smoking status: Never Smoker  . Smokeless tobacco: Never Used  Substance Use Topics  . Alcohol use: No  . Drug  use: No    Review of Systems  Review of Systems  Constitutional: Negative for chills and fever.  HENT: Negative for sore throat.   Eyes: Negative for pain.  Respiratory: Positive for shortness of breath. Negative for cough and stridor.   Cardiovascular: Negative for chest pain.  Gastrointestinal: Positive for abdominal pain, diarrhea and nausea. Negative for vomiting.  Genitourinary: Negative for dysuria.  Musculoskeletal: Positive for back pain. Negative for myalgias.  Skin: Negative for rash.  Neurological: Negative for seizures, loss of consciousness and headaches.  Psychiatric/Behavioral: Negative for suicidal ideas.  All other systems reviewed and are negative.     ____________________________________________   PHYSICAL EXAM:  VITAL SIGNS: ED Triage Vitals  Enc Vitals Group     BP 12/16/20 1610 127/71     Pulse Rate 12/16/20 1610 75     Resp 12/16/20 1610 16     Temp 12/16/20 1610 98.3 F (36.8 C)     Temp Source 12/16/20 1610 Oral     SpO2 12/16/20 1610 100 %     Weight 12/16/20 1611 234 lb (106.1 kg)     Height 12/16/20 1611 5\' 6"  (1.676 m)     Head Circumference --      Peak Flow --      Pain Score 12/16/20 1611 6     Pain Loc --      Pain Edu? --      Excl. in GC? --    Vitals:   12/16/20 1610  BP: 127/71  Pulse: 75  Resp: 16  Temp: 98.3 F (36.8 C)  SpO2: 100%   Physical Exam Vitals and nursing note reviewed.  Constitutional:      General: She is not in acute distress.    Appearance: She is well-developed. She is obese.  HENT:     Head: Normocephalic and atraumatic.     Right Ear: External ear normal.     Left Ear: External ear normal.     Nose: Nose normal.     Mouth/Throat:     Mouth: Mucous membranes are moist.  Eyes:     Conjunctiva/sclera: Conjunctivae normal.  Cardiovascular:     Rate and Rhythm: Normal rate and regular rhythm.     Heart sounds: No murmur heard.   Pulmonary:     Effort: Pulmonary effort is normal. No  respiratory distress.     Breath sounds: Normal breath sounds.  Abdominal:     Palpations: Abdomen is soft.     Tenderness: There is abdominal tenderness in the right upper quadrant. There is no right CVA tenderness or left CVA tenderness.  Musculoskeletal:     Cervical back: Neck supple.  Skin:    General: Skin is warm and dry.     Capillary Refill: Capillary refill takes less than 2 seconds.  Neurological:     Mental Status: She is alert and oriented to person, place, and time.  Cranial Nerves: No cranial nerve deficit.     Motor: No weakness.     Gait: Gait normal.  Psychiatric:        Mood and Affect: Mood normal.     Some mild tenderness over the left thoracic and left para lumbar muscles without overlying skin changes.  No midline tenderness. ____________________________________________   LABS (all labs ordered are listed, but only abnormal results are displayed)  Labs Reviewed  COMPREHENSIVE METABOLIC PANEL - Abnormal; Notable for the following components:      Result Value   Glucose, Bld 112 (*)    All other components within normal limits  URINALYSIS, COMPLETE (UACMP) WITH MICROSCOPIC - Abnormal; Notable for the following components:   Color, Urine YELLOW (*)    APPearance CLEAR (*)    All other components within normal limits  RESP PANEL BY RT-PCR (FLU A&B, COVID) ARPGX2  LIPASE, BLOOD  CBC  PREGNANCY, URINE   ____________________________________________  EKG ____________________________________________  RADIOLOGY  ED MD interpretation: Chest x-ray is unremarkable.  Right upper quadrant ultrasound shows evidence of contracted gallbladder with stones without evidence of cholecystitis and unremarkable CBC.  Official radiology report(s): DG Chest 2 View  Result Date: 12/16/2020 CLINICAL DATA:  Short of breath EXAM: CHEST - 2 VIEW COMPARISON:  01/08/2020 FINDINGS: The heart size and mediastinal contours are within normal limits. Both lungs are clear. The  visualized skeletal structures are unremarkable. IMPRESSION: No active cardiopulmonary disease. Electronically Signed   By: Marlan Palau M.D.   On: 12/16/2020 18:03   US ABDOMEN LIMITED RUQ (LIVER/GB)  Result Date: 12/16/2020 CLINICAL DATA:  Right upper quadrant pain x3 days EXAM: ULTRASOUND ABDOMEN LIMITED RIGHT UPPER QUADRANT COMPARISON:  None. FINDINGS: Gallbladder: The gallbladder appears contracted with layering small stones/sludge. No sonographic Murphy sign noted by sonographer. Common bile duct: Diameter: 2.7 mm Liver: No focal lesion identified. Within normal limits in parenchymal echogenicity. Portal vein is patent on color Doppler imaging with normal direction of blood flow towards the liver. Other: None. IMPRESSION: Contracted gallbladder with layering stones/sludge. No evidence of acute cholecystitis Electronically Signed   By: Jonna Clark M.D.   On: 12/16/2020 18:16    ____________________________________________   PROCEDURES  Procedure(s) performed (including Critical Care):  Procedures   ____________________________________________   INITIAL IMPRESSION / ASSESSMENT AND PLAN / ED COURSE      Patient presents with left history exam for assessment of some epigastric and right upper quadrant pain associate with some nausea and light-colored stools.  She also complaining of some left lower back discomfort.  She is afebrile and hemodynamically stable arrival.  She is tender in her right upper quadrant has some mild left parathoracic and paralumbar muscle tenderness.  With regard to her left lower pain is suspect this is unrelated to her other symptoms and likely MSK.  No history of recent trauma and there are no findings on exam to suggest acute infectious etiology i.e. cellulitis cystitis or osteomyelitis or spinal abscess.  She has no incontinence or lower extremity or perineal numbness or any other concerning findings on history or exam to suggest spinal cord compression at  this time.  UA has no blood or evidence of infection given absence of fever elevated blood cell count Evalose patient for kidney stone or pyonephritis.  With regard to her nausea right upper quadrant and epigastric symptoms and light-colored stools suspect this is related to some symptomatic gallstones possible biliary sludging.  These are seen on ultrasound which otherwise does not show evidence  of bile duct dilation intra biliary ductal stone or cholecystitis.  CMP has no evidence of significant electrolyte or metabolic derangements or evidence of hepatitis or cholestasis.  Lipase is not consistent with acute pancreatitis.  Covid is negative.  Patient stated she felt little short of breath she was not sure if this was from the pain in her right upper quadrant or unrelated chest x-ray obtained has no evidence of pneumonia pneumothorax or any other acute intrathoracic process I suspect is related to her right upper quadrant and stone pain.  Suspect symptoms primarily from symptomatic lithiasis.  Will write short course of Percocet and outpatient follow-up with surgery.  Explained clinical plan.  Patient discharged stable condition.  Strict return precautions advised and discussed.        ____________________________________________   FINAL CLINICAL IMPRESSION(S) / ED DIAGNOSES  Final diagnoses:  RUQ pain  Symptomatic cholelithiasis    Medications  ondansetron (ZOFRAN-ODT) disintegrating tablet 4 mg (4 mg Oral Given 12/16/20 1744)     ED Discharge Orders         Ordered    ondansetron (ZOFRAN) 4 MG tablet  Every 8 hours PRN        12/16/20 1923    oxyCODONE-acetaminophen (PERCOCET) 5-325 MG tablet  Every 8 hours PRN        12/16/20 1923           Note:  This document was prepared using Dragon voice recognition software and may include unintentional dictation errors.   Gilles Chiquito, MD 12/16/20 [phone]

## 2020-12-24 ENCOUNTER — Telehealth: Payer: Self-pay | Admitting: Surgery

## 2020-12-24 ENCOUNTER — Other Ambulatory Visit
Admission: RE | Admit: 2020-12-24 | Discharge: 2020-12-24 | Disposition: A | Discharge status: Home / Self Care | Payer: Medicaid Other | Source: Home / Self Care | Attending: Surgery | Admitting: Surgery

## 2020-12-24 ENCOUNTER — Encounter: Payer: Self-pay | Admitting: Surgery

## 2020-12-24 ENCOUNTER — Ambulatory Visit: Payer: Self-pay | Admitting: Surgery

## 2020-12-24 ENCOUNTER — Other Ambulatory Visit
Admission: RE | Admit: 2020-12-24 | Discharge: 2020-12-24 | Disposition: A | Discharge status: Home / Self Care | Payer: Medicaid Other | Source: Ambulatory Visit | Attending: Surgery | Admitting: Surgery

## 2020-12-24 ENCOUNTER — Other Ambulatory Visit: Payer: Self-pay

## 2020-12-24 ENCOUNTER — Telehealth: Payer: Self-pay

## 2020-12-24 VITALS — BP 135/86 | HR 94 | Temp 98.8°F | Ht 66.0 in | Wt 240.2 lb

## 2020-12-24 DIAGNOSIS — Z01812 Encounter for preprocedural laboratory examination: Secondary | ICD-10-CM | POA: Diagnosis present

## 2020-12-24 DIAGNOSIS — K8020 Calculus of gallbladder without cholecystitis without obstruction: Secondary | ICD-10-CM | POA: Diagnosis not present

## 2020-12-24 DIAGNOSIS — Z20822 Contact with and (suspected) exposure to covid-19: Secondary | ICD-10-CM | POA: Insufficient documentation

## 2020-12-24 DIAGNOSIS — K810 Acute cholecystitis: Secondary | ICD-10-CM

## 2020-12-24 LAB — COMPREHENSIVE METABOLIC PANEL
ALT: 16 U/L (ref 0–44)
AST: 20 U/L (ref 15–41)
Albumin: 4.4 g/dL (ref 3.5–5.0)
Alkaline Phosphatase: 66 U/L (ref 38–126)
Anion gap: 8 (ref 5–15)
BUN: 10 mg/dL (ref 6–20)
CO2: 26 mmol/L (ref 22–32)
Calcium: 9.3 mg/dL (ref 8.9–10.3)
Chloride: 103 mmol/L (ref 98–111)
Creatinine, Ser: 0.99 mg/dL (ref 0.44–1.00)
GFR, Estimated: >60 mL/min (ref >60)
Glucose, Bld: 101 mg/dL — ABNORMAL HIGH (ref 70–99)
Potassium: 4 mmol/L (ref 3.5–5.1)
Sodium: 137 mmol/L (ref 135–145)
Total Bilirubin: 0.8 mg/dL (ref 0.3–1.2)
Total Protein: 7.9 g/dL (ref 6.5–8.1)

## 2020-12-24 LAB — CBC
HCT: 39.9 % (ref 36.0–46.0)
Hemoglobin: 13.7 g/dL (ref 12.0–15.0)
MCH: 29.3 pg (ref 26.0–34.0)
MCHC: 34.3 g/dL (ref 30.0–36.0)
MCV: 85.3 fL (ref 80.0–100.0)
Platelets: 156 10*3/uL (ref 150–400)
RBC: 4.68 MIL/uL (ref 3.87–5.11)
RDW: 12.2 % (ref 11.5–15.5)
WBC: 6.9 10*3/uL (ref 4.0–10.5)
nRBC: 0 % (ref 0.0–0.2)

## 2020-12-24 LAB — SARS CORONAVIRUS 2 (TAT 6-24 HRS): SARS Coronavirus 2: NEGATIVE

## 2020-12-24 NOTE — H&P (View-Only) (Signed)
Patient ID: April Tran, female   DOB: 28-Jul-1991, 30 y.o.   MRN: [id number]  HPI April Tran is a 30 y.o. female seen in consultation at the request of Dr. Katrinka Blazing from the emergency room.  She came into the emergency room last week with right upper quadrant pain nausea.  Pain radiates to back and also to the shoulder.  Pain is intermittent moderate intensity and over the last few days has increased in intensity.  She did have change in the color of the stool , light colored stools now the stool color has normalized.  Work-up in emergency room revealed normal CBC and CMP.  I have personally reviewed ultrasound showing normal common bile duct and cholelithiasis.  There was no evidence of wall thickening to suggest cholecystitis. sHe has had a tubal ligation.  No other surgical history.  She is able to perform more than 6 METS of activity without any shortness of breath or chest pain.  She denies any fevers any chills and no evidence of active bleeding obstruction  HPI  Past Medical History:  Diagnosis Date  . Depression     Past Surgical History:  Procedure Laterality Date  . TONSILLECTOMY    . TUBAL LIGATION Bilateral 12/10/2016   Procedure: BILATERAL TUBAL LIGATION;  Surgeon: Elenora Fender Ward, MD;  Location: [facility] ORS;  Service: Gynecology;  Laterality: Bilateral;  . TUBAL LIGATION    . WISDOM TOOTH EXTRACTION      Family History  Problem Relation Age of Onset  . Hypertension Mother   . Depression Mother   . Hypertension Father   . Diabetes Father   . Depression Father   . Hypertension Maternal Aunt   . Depression Maternal Aunt   . Hypertension Paternal Aunt   . Hypertension Maternal Grandmother   . Depression Maternal Grandmother   . Hypertension Maternal Grandfather   . Depression Maternal Grandfather   . Heart disease Maternal Grandfather   . Hypertension Paternal Grandmother   . Depression Paternal Grandmother   . Hypertension Paternal Grandfather   . Depression  Paternal Grandfather     Social History Social History   Tobacco Use  . Smoking status: Never Smoker  . Smokeless tobacco: Never Used  Substance Use Topics  . Alcohol use: No  . Drug use: No    Allergies  Allergen Reactions  . Morphine And Related Hypertension    Current Outpatient Medications  Medication Sig Dispense Refill  . ondansetron (ZOFRAN ODT) 4 MG disintegrating tablet Take 1 tablet (4 mg total) by mouth every 8 (eight) hours as needed for nausea or vomiting. 20 tablet 0  . oxyCODONE-acetaminophen (PERCOCET/ROXICET) 5-325 MG tablet Take 1 tablet by mouth every 4 (four) hours as needed for severe pain.    . traZODone (DESYREL) 50 MG tablet Take 50 mg by mouth at bedtime.    Marland Kitchen acetaminophen (TYLENOL) 500 MG tablet Take 1,000 mg by mouth every 6 (six) hours as needed for moderate pain or headache.    . ibuprofen (ADVIL) 200 MG tablet Take 400 mg by mouth every 6 (six) hours as needed for moderate pain or headache.    . sertraline (ZOLOFT) 100 MG tablet Take 100 mg by mouth at bedtime as needed (depression).     No current facility-administered medications for this visit.     Review of Systems Full ROS  was asked and was negative except for the information on the HPI  Physical Exam Blood pressure 135/86, pulse 94, temperature 98.8 F (  37.1 C), temperature source Oral, height 5\' 6"  (1.676 m), weight 240 lb 3.2 oz (109 kg), last menstrual period 12/09/2020, SpO2 97 %, unknown if currently breastfeeding. CONSTITUTIONAL: non toxic EYES: Pupils are equal, round, , Sclera are non-icteric. EARS, NOSE, MOUTH AND THROAT: sHe is wearing a mask. Hearing is intact to voice. LYMPH NODES:  Lymph nodes in the neck are normal. RESPIRATORY:  Lungs are clear. There is normal respiratory effort, with equal breath sounds bilaterally, and without pathologic use of accessory muscles. CARDIOVASCULAR: Heart is regular without murmurs, gallops, or rubs. GI: The abdomen is soft, patient right  upper quadrant with positive Murphy sign.  No peritonitis. there are no palpable masses. There is no hepatosplenomegaly. There are normal bowel sounds. GU: Rectal deferred.   MUSCULOSKELETAL: Normal muscle strength and tone. No cyanosis or edema.   SKIN: Turgor is good and there are no pathologic skin lesions or ulcers. NEUROLOGIC: Motor and sensation is grossly normal. Cranial nerves are grossly intact. PSYCH:  Oriented to person, place and time. Affect is normal.  Data Reviewed  I have personally reviewed the patient's imaging, laboratory findings and medical records.    Assessment/Plan 30 year old female with an acute cholecystitis.  She does have crescendo symptoms.  Although imaging studies do not suggest acute cholecystitis clinical exam highly suggest cholecystitis.  I do recommend cholecystectomy in a timely fashion ideally tomorrow. Even that she had acholia I will repeat a CMP.  She more likely had some cholestasis or transient biliary obstruction.  At this time her bowel movement color is normalized. I discussed the procedure in detail.  The patient was given 26.  We discussed the risks and benefits of a laparoscopic cholecystectomy and possible cholangiogram including, but not limited to bleeding, infection, injury to surrounding structures such as the intestine or liver, bile leak, retained gallstones, need to convert to an open procedure, prolonged diarrhea, blood clots such as  DVT, common bile duct injury, anesthesia risks, and possible need for additional procedures.  The likelihood of improvement in symptoms and return to the patient's normal status is good. We discussed the typical post-operative recovery course. Time spent with the patient was 65 minutes, with more than 50% of the time spent in face-to-face education, counseling and care coordination.     Agricultural engineer, MD FACS General Surgeon 12/24/2020, 11:39 AM

## 2020-12-24 NOTE — Progress Notes (Signed)
Patient ID: April Tran, female   DOB: 09/09/1991, 30 y.o.   MRN: [id number]  HPI April Tran is a 30 y.o. female seen in consultation at the request of Dr. Smith from the emergency room.  She came into the emergency room last week with right upper quadrant pain nausea.  Pain radiates to back and also to the shoulder.  Pain is intermittent moderate intensity and over the last few days has increased in intensity.  She did have change in the color of the stool , light colored stools now the stool color has normalized.  Work-up in emergency room revealed normal CBC and CMP.  I have personally reviewed ultrasound showing normal common bile duct and cholelithiasis.  There was no evidence of wall thickening to suggest cholecystitis. sHe has had a tubal ligation.  No other surgical history.  She is able to perform more than 6 METS of activity without any shortness of breath or chest pain.  She denies any fevers any chills and no evidence of active bleeding obstruction  HPI  Past Medical History:  Diagnosis Date  . Depression     Past Surgical History:  Procedure Laterality Date  . TONSILLECTOMY    . TUBAL LIGATION Bilateral 12/10/2016   Procedure: BILATERAL TUBAL LIGATION;  Surgeon: Chelsea C Ward, MD;  Location: [facility] ORS;  Service: Gynecology;  Laterality: Bilateral;  . TUBAL LIGATION    . WISDOM TOOTH EXTRACTION      Family History  Problem Relation Age of Onset  . Hypertension Mother   . Depression Mother   . Hypertension Father   . Diabetes Father   . Depression Father   . Hypertension Maternal Aunt   . Depression Maternal Aunt   . Hypertension Paternal Aunt   . Hypertension Maternal Grandmother   . Depression Maternal Grandmother   . Hypertension Maternal Grandfather   . Depression Maternal Grandfather   . Heart disease Maternal Grandfather   . Hypertension Paternal Grandmother   . Depression Paternal Grandmother   . Hypertension Paternal Grandfather   . Depression  Paternal Grandfather     Social History Social History   Tobacco Use  . Smoking status: Never Smoker  . Smokeless tobacco: Never Used  Substance Use Topics  . Alcohol use: No  . Drug use: No    Allergies  Allergen Reactions  . Morphine And Related Hypertension    Current Outpatient Medications  Medication Sig Dispense Refill  . ondansetron (ZOFRAN ODT) 4 MG disintegrating tablet Take 1 tablet (4 mg total) by mouth every 8 (eight) hours as needed for nausea or vomiting. 20 tablet 0  . oxyCODONE-acetaminophen (PERCOCET/ROXICET) 5-325 MG tablet Take 1 tablet by mouth every 4 (four) hours as needed for severe pain.    . traZODone (DESYREL) 50 MG tablet Take 50 mg by mouth at bedtime.    . acetaminophen (TYLENOL) 500 MG tablet Take 1,000 mg by mouth every 6 (six) hours as needed for moderate pain or headache.    . ibuprofen (ADVIL) 200 MG tablet Take 400 mg by mouth every 6 (six) hours as needed for moderate pain or headache.    . sertraline (ZOLOFT) 100 MG tablet Take 100 mg by mouth at bedtime as needed (depression).     No current facility-administered medications for this visit.     Review of Systems Full ROS  was asked and was negative except for the information on the HPI  Physical Exam Blood pressure 135/86, pulse 94, temperature 98.8 F (  37.1 C), temperature source Oral, height 5\' 6"  (1.676 m), weight 240 lb 3.2 oz (109 kg), last menstrual period 12/09/2020, SpO2 97 %, unknown if currently breastfeeding. CONSTITUTIONAL: non toxic EYES: Pupils are equal, round, , Sclera are non-icteric. EARS, NOSE, MOUTH AND THROAT: sHe is wearing a mask. Hearing is intact to voice. LYMPH NODES:  Lymph nodes in the neck are normal. RESPIRATORY:  Lungs are clear. There is normal respiratory effort, with equal breath sounds bilaterally, and without pathologic use of accessory muscles. CARDIOVASCULAR: Heart is regular without murmurs, gallops, or rubs. GI: The abdomen is soft, patient right  upper quadrant with positive Murphy sign.  No peritonitis. there are no palpable masses. There is no hepatosplenomegaly. There are normal bowel sounds. GU: Rectal deferred.   MUSCULOSKELETAL: Normal muscle strength and tone. No cyanosis or edema.   SKIN: Turgor is good and there are no pathologic skin lesions or ulcers. NEUROLOGIC: Motor and sensation is grossly normal. Cranial nerves are grossly intact. PSYCH:  Oriented to person, place and time. Affect is normal.  Data Reviewed  I have personally reviewed the patient's imaging, laboratory findings and medical records.    Assessment/Plan 30 year old female with an acute cholecystitis.  She does have crescendo symptoms.  Although imaging studies do not suggest acute cholecystitis clinical exam highly suggest cholecystitis.  I do recommend cholecystectomy in a timely fashion ideally tomorrow. Even that she had acholia I will repeat a CMP.  She more likely had some cholestasis or transient biliary obstruction.  At this time her bowel movement color is normalized. I discussed the procedure in detail.  The patient was given 26.  We discussed the risks and benefits of a laparoscopic cholecystectomy and possible cholangiogram including, but not limited to bleeding, infection, injury to surrounding structures such as the intestine or liver, bile leak, retained gallstones, need to convert to an open procedure, prolonged diarrhea, blood clots such as  DVT, common bile duct injury, anesthesia risks, and possible need for additional procedures.  The likelihood of improvement in symptoms and return to the patient's normal status is good. We discussed the typical post-operative recovery course. Time spent with the patient was 65 minutes, with more than 50% of the time spent in face-to-face education, counseling and care coordination.     Agricultural engineer, MD FACS General Surgeon 12/24/2020, 11:39 AM

## 2020-12-24 NOTE — Patient Instructions (Signed)
Our surgery scheduler Britta Mccreedy will call you within 24-48 hours to get you scheduled. If you have not heard from her after 48 hours, please call our office. You will need to get Covid tested before surgery and have the blue sheet available when she calls to write down important information. If you have any concerns or questions, please feel free to call our office.   Minimally Invasive Cholecystectomy Minimally invasive cholecystectomy is surgery to remove the gallbladder. The gallbladder is a pear-shaped organ that lies beneath the liver on the right side of the body. The gallbladder stores bile, which is a fluid that helps the body digest fats. Cholecystectomy is often done to treat inflammation of the gallbladder (cholecystitis). This condition is usually caused by a buildup of gallstones (cholelithiasis) in the gallbladder. Gallstones can block the flow of bile, which can result in inflammation and pain. In severe cases, emergency surgery may be required. This procedure is done though small incisions in the abdomen, instead of one large incision. It is also called laparoscopic surgery. A thin scope with a camera (laparoscope) is inserted through one incision. Then surgical instruments are inserted through the other incisions. In some cases, a minimally invasive surgery may need to be changed to a surgery that is done through a larger incision. This is called open surgery. Tell a health care provider about:  Any allergies you have.  All medicines you are taking, including vitamins, herbs, eye drops, creams, and over-the-counter medicines.  Any problems you or family members have had with anesthetic medicines.  Any blood disorders you have.  Any surgeries you have had.  Any medical conditions you have.  Whether you are pregnant or may be pregnant. What are the risks? Generally, this is a safe procedure. However, problems may occur, including:  Infection.  Bleeding.  Allergic reactions  to medicines.  Damage to nearby structures or organs.  A stone remaining in the common bile duct. The common bile duct carries bile from the gallbladder into the small intestine.  A bile leak from the cyst duct that is clipped when your gallbladder is removed. What happens before the procedure? Staying hydrated Follow instructions from your health care provider about hydration, which may include:  Up to 2 hours before the procedure - you may continue to drink clear liquids, such as water, clear fruit juice, black coffee, and plain tea.   Eating and drinking restrictions Follow instructions from your health care provider about eating and drinking, which may include:  8 hours before the procedure - stop eating heavy meals or foods, such as meat, fried foods, or fatty foods.  6 hours before the procedure - stop eating light meals or foods, such as toast or cereal.  6 hours before the procedure - stop drinking milk or drinks that contain milk.  2 hours before the procedure - stop drinking clear liquids. Medicines Ask your health care provider about:  Changing or stopping your regular medicines. This is especially important if you are taking diabetes medicines or blood thinners.  Taking medicines such as aspirin and ibuprofen. These medicines can thin your blood. Do not take these medicines unless your health care provider tells you to take them.  Taking over-the-counter medicines, vitamins, herbs, and supplements. General instructions  Let your health care provider know if you develop a cold or an infection before surgery.  Plan to have someone take you home from the hospital or clinic.  If you will be going home right after the procedure,  plan to have someone with you for 24 hours.  Ask your health care provider: ? How your surgery site will be marked. ? What steps will be taken to help prevent infection. These may include:  Removing hair at the surgery site.  Washing skin  with a germ-killing soap.  Taking antibiotic medicine. What happens during the procedure?  An IV will be inserted into one of your veins.  You will be given one or both of the following: ? A medicine to help you relax (sedative). ? A medicine to make you fall asleep (general anesthetic).  A breathing tube will be placed in your mouth.  Your surgeon will make several small incisions in your abdomen.  The laparoscope will be inserted through one of the small incisions. The camera on the laparoscope will send images to a monitor in the operating room. This lets your surgeon see inside your abdomen.  A gas will be pumped into your abdomen. This will expand your abdomen to give the surgeon more room to perform the surgery.  Other tools that are needed for the procedure will be inserted through the other incisions. The gallbladder will be removed through one of the incisions.  Your common bile duct may be examined. If stones are found in the common bile duct, they may be removed.  After your gallbladder has been removed, the incisions will be closed with stitches (sutures), staples, or skin glue.  Your incisions may be covered with a bandage (dressing). The procedure may vary among health care providers and hospitals.   What happens after the procedure?  Your blood pressure, heart rate, breathing rate, and blood oxygen level will be monitored until you leave the hospital or clinic.  You will be given medicines as needed to control your pain.  If you were given a sedative during the procedure, it can affect you for several hours. Do not drive or operate machinery until your health care provider says that it is safe. Summary  Minimally invasive cholecystectomy, also called laparoscopic cholecystectomy, is surgery to remove the gallbladder using small incisions.  Tell your health care provider about all the medical conditions you have and all the medicines you are taking for those  conditions.  Before the procedure, follow instructions about eating or drinking restrictions and changing or stopping medicines.  If you were given a sedative during the procedure, it can affect you for several hours. Do not drive or operate machinery until your health care provider says that it is safe. This information is not intended to replace advice given to you by your health care provider. Make sure you discuss any questions you have with your health care provider. Document Revised: 06/04/2019 Document Reviewed: 06/04/2019 Elsevier Patient Education  2021 [employer]     Gallbladder Eating Plan If you have a gallbladder condition, you may have trouble digesting fats. Eating a low-fat diet can help reduce your symptoms, and may be helpful before and after having surgery to remove your gallbladder (cholecystectomy). Your health care provider may recommend that you work with a diet and nutrition specialist (dietitian) to help you reduce the amount of fat in your diet. What are tips for following this plan? General guidelines  Limit your fat intake to less than 30% of your total daily calories. If you eat around 1,800 calories each day, this is less than 60 grams (g) of fat per day.  Fat is an important part of a healthy diet. Eating a low-fat diet can make  it hard to maintain a healthy body weight. Ask your dietitian how much fat, calories, and other nutrients you need each day.  Eat small, frequent meals throughout the day instead of three large meals.  Drink at least 8-10 cups of fluid a day. Drink enough fluid to keep your urine clear or pale yellow.  Limit alcohol intake to no more than 1 drink a day for nonpregnant women and 2 drinks a day for men. One drink equals 12 oz of beer, 5 oz of wine, or 1 oz of hard liquor. Reading food labels  Check Nutrition Facts on food labels for the amount of fat per serving. Choose foods with less than 3 grams of fat per serving.    Shopping  Choose nonfat and low-fat healthy foods. Look for the words "nonfat," "low fat," or "fat free."  Avoid buying processed or prepackaged foods. Cooking  Cook using low-fat methods, such as baking, broiling, grilling, or boiling.  Cook with small amounts of healthy fats, such as olive oil, grapeseed oil, canola oil, or sunflower oil. What foods are recommended?  All fresh, frozen, or canned fruits and vegetables.  Whole grains.  Low-fat or non-fat (skim) milk and yogurt.  Lean meat, skinless poultry, fish, eggs, and beans.  Low-fat protein supplement powders or drinks.  Spices and herbs. What foods are not recommended?  High-fat foods. These include baked goods, fast food, fatty cuts of meat, ice cream, french toast, sweet rolls, pizza, cheese bread, foods covered with butter, creamy sauces, or cheese.  Fried foods. These include french fries, tempura, battered fish, breaded chicken, fried breads, and sweets.  Foods with strong odors.  Foods that cause bloating and gas. Summary  A low-fat diet can be helpful if you have a gallbladder condition, or before and after gallbladder surgery.  Limit your fat intake to less than 30% of your total daily calories. This is about 60 g of fat if you eat 1,800 calories each day.  Eat small, frequent meals throughout the day instead of three large meals. This information is not intended to replace advice given to you by your health care provider. Make sure you discuss any questions you have with your health care provider. Document Revised: 04/17/2020 Document Reviewed: 04/17/2020 Elsevier Patient Education  2021 [employer].

## 2020-12-24 NOTE — Telephone Encounter (Signed)
Patient has been advised of Pre-Admission date/time, COVID Testing date and Surgery date.  Surgery Date: 12/25/20 Preadmission Testing Date: 12/25/20 patient to arrive 2 hours early.   Covid Testing Date: 12/24/20 at 11:00 am - patient [facility] to [facility] ([address] [facility])   Patient has been made aware to call [phone], between 1-3:00pm the day before surgery, to find out what time to arrive for surgery.

## 2020-12-24 NOTE — Telephone Encounter (Signed)
Pt notified of normal lab results. Verbalizes understanding. 

## 2020-12-25 ENCOUNTER — Ambulatory Visit: Payer: Medicaid Other | Admitting: Anesthesiology

## 2020-12-25 ENCOUNTER — Other Ambulatory Visit: Payer: Self-pay

## 2020-12-25 ENCOUNTER — Encounter
Admission: RE | Disposition: A | Discharge status: Home / Self Care | Payer: Self-pay | Source: Home / Self Care | Attending: Surgery

## 2020-12-25 ENCOUNTER — Ambulatory Visit
Admission: RE | Admit: 2020-12-25 | Discharge: 2020-12-25 | Disposition: A | Discharge status: Home / Self Care | Payer: Medicaid Other | Attending: Surgery | Admitting: Surgery

## 2020-12-25 ENCOUNTER — Encounter: Payer: Self-pay | Admitting: Surgery

## 2020-12-25 DIAGNOSIS — K810 Acute cholecystitis: Secondary | ICD-10-CM

## 2020-12-25 DIAGNOSIS — K819 Cholecystitis, unspecified: Secondary | ICD-10-CM | POA: Diagnosis not present

## 2020-12-25 DIAGNOSIS — K811 Chronic cholecystitis: Secondary | ICD-10-CM | POA: Diagnosis not present

## 2020-12-25 DIAGNOSIS — Z79899 Other long term (current) drug therapy: Secondary | ICD-10-CM | POA: Insufficient documentation

## 2020-12-25 DIAGNOSIS — K8010 Calculus of gallbladder with chronic cholecystitis without obstruction: Secondary | ICD-10-CM | POA: Diagnosis not present

## 2020-12-25 DIAGNOSIS — Z885 Allergy status to narcotic agent status: Secondary | ICD-10-CM | POA: Insufficient documentation

## 2020-12-25 DIAGNOSIS — K812 Acute cholecystitis with chronic cholecystitis: Secondary | ICD-10-CM

## 2020-12-25 LAB — POCT PREGNANCY, URINE: Preg Test, Ur: NEGATIVE

## 2020-12-25 SURGERY — CHOLECYSTECTOMY, ROBOT-ASSISTED, LAPAROSCOPIC
Anesthesia: General

## 2020-12-25 MED ORDER — CEFAZOLIN SODIUM-DEXTROSE 2-4 GM/100ML-% IV SOLN
INTRAVENOUS | Status: AC
  Filled 2020-12-25: qty 100

## 2020-12-25 MED ORDER — LACTATED RINGERS IV SOLN: INTRAVENOUS | Status: DC | PRN

## 2020-12-25 MED ORDER — GABAPENTIN 300 MG PO CAPS
ORAL_CAPSULE | ORAL | Status: AC
  Administered 2020-12-25: 300 mg via ORAL
  Filled 2020-12-25: qty 1

## 2020-12-25 MED ORDER — CHLORHEXIDINE GLUCONATE 0.12 % MT SOLN
OROMUCOSAL | Status: AC
  Administered 2020-12-25: 15 mL via OROMUCOSAL
  Filled 2020-12-25: qty 15

## 2020-12-25 MED ORDER — ONDANSETRON HCL 4 MG/2ML IJ SOLN
INTRAMUSCULAR | Status: DC | PRN
  Administered 2020-12-25: 4 mg via INTRAVENOUS

## 2020-12-25 MED ORDER — DEXMEDETOMIDINE (PRECEDEX) IN NS 20 MCG/5ML (4 MCG/ML) IV SYRINGE
PREFILLED_SYRINGE | INTRAVENOUS | Status: DC | PRN
  Administered 2020-12-25: 12 ug via INTRAVENOUS
  Administered 2020-12-25: 8 ug via INTRAVENOUS

## 2020-12-25 MED ORDER — GABAPENTIN 300 MG PO CAPS: 300.0000 mg | ORAL_CAPSULE | ORAL | Status: AC

## 2020-12-25 MED ORDER — ONDANSETRON HCL 4 MG/2ML IJ SOLN: 4.0000 mg | Freq: Once | INTRAMUSCULAR | Status: DC | PRN

## 2020-12-25 MED ORDER — FENTANYL CITRATE (PF) 100 MCG/2ML IJ SOLN
INTRAMUSCULAR | Status: AC
  Administered 2020-12-25: 25 ug via INTRAVENOUS
  Filled 2020-12-25: qty 2

## 2020-12-25 MED ORDER — PROPOFOL 10 MG/ML IV BOLUS
INTRAVENOUS | Status: DC | PRN
  Administered 2020-12-25: 200 mg via INTRAVENOUS
  Administered 2020-12-25: 80 mg via INTRAVENOUS

## 2020-12-25 MED ORDER — PROPOFOL 10 MG/ML IV BOLUS
INTRAVENOUS | Status: AC
  Filled 2020-12-25: qty 20

## 2020-12-25 MED ORDER — FENTANYL CITRATE (PF) 100 MCG/2ML IJ SOLN
INTRAMUSCULAR | Status: AC
  Filled 2020-12-25: qty 2

## 2020-12-25 MED ORDER — FENTANYL CITRATE (PF) 100 MCG/2ML IJ SOLN
INTRAMUSCULAR | Status: DC | PRN
  Administered 2020-12-25 (×3): 50 ug via INTRAVENOUS

## 2020-12-25 MED ORDER — INDOCYANINE GREEN 25 MG IV SOLR
5.0000 mg | Freq: Once | INTRAVENOUS | Status: AC
  Administered 2020-12-25: 5 mg via INTRAVENOUS
  Filled 2020-12-25: qty 2

## 2020-12-25 MED ORDER — DEXAMETHASONE SODIUM PHOSPHATE 10 MG/ML IJ SOLN
INTRAMUSCULAR | Status: DC | PRN
  Administered 2020-12-25: 10 mg via INTRAVENOUS

## 2020-12-25 MED ORDER — CELECOXIB 200 MG PO CAPS
ORAL_CAPSULE | ORAL | Status: AC
  Administered 2020-12-25: 200 mg via ORAL
  Filled 2020-12-25: qty 1

## 2020-12-25 MED ORDER — MIDAZOLAM HCL 2 MG/2ML IJ SOLN
INTRAMUSCULAR | Status: AC
  Filled 2020-12-25: qty 2

## 2020-12-25 MED ORDER — ROCURONIUM BROMIDE 10 MG/ML (PF) SYRINGE
PREFILLED_SYRINGE | INTRAVENOUS | Status: AC
  Filled 2020-12-25: qty 10

## 2020-12-25 MED ORDER — PROPOFOL 10 MG/ML IV BOLUS
INTRAVENOUS | Status: AC
  Filled 2020-12-25: qty 40

## 2020-12-25 MED ORDER — BUPIVACAINE LIPOSOME 1.3 % IJ SUSP
INTRAMUSCULAR | Status: DC | PRN
  Administered 2020-12-25: 20 mL

## 2020-12-25 MED ORDER — ROCURONIUM BROMIDE 100 MG/10ML IV SOLN
INTRAVENOUS | Status: DC | PRN
  Administered 2020-12-25: 50 mg via INTRAVENOUS
  Administered 2020-12-25: 10 mg via INTRAVENOUS

## 2020-12-25 MED ORDER — EPHEDRINE 5 MG/ML INJ
INTRAVENOUS | Status: AC
  Filled 2020-12-25: qty 10

## 2020-12-25 MED ORDER — MIDAZOLAM HCL 2 MG/2ML IJ SOLN
INTRAMUSCULAR | Status: DC | PRN
  Administered 2020-12-25: 2 mg via INTRAVENOUS

## 2020-12-25 MED ORDER — SUGAMMADEX SODIUM 200 MG/2ML IV SOLN
INTRAVENOUS | Status: DC | PRN
  Administered 2020-12-25: 200 mg via INTRAVENOUS

## 2020-12-25 MED ORDER — OXYCODONE HCL 5 MG PO TABS
ORAL_TABLET | ORAL | Status: AC
  Filled 2020-12-25: qty 1

## 2020-12-25 MED ORDER — OXYCODONE HCL 5 MG/5ML PO SOLN: 5.0000 mg | Freq: Once | ORAL | Status: AC | PRN

## 2020-12-25 MED ORDER — CEFAZOLIN SODIUM-DEXTROSE 2-4 GM/100ML-% IV SOLN
2.0000 g | INTRAVENOUS | Status: AC
  Administered 2020-12-25: 2 g via INTRAVENOUS

## 2020-12-25 MED ORDER — LIDOCAINE HCL (CARDIAC) PF 100 MG/5ML IV SOSY
PREFILLED_SYRINGE | INTRAVENOUS | Status: DC | PRN
  Administered 2020-12-25: 100 mg via INTRAVENOUS

## 2020-12-25 MED ORDER — FENTANYL CITRATE (PF) 100 MCG/2ML IJ SOLN
25.0000 ug | INTRAMUSCULAR | Status: DC | PRN
  Administered 2020-12-25 (×3): 25 ug via INTRAVENOUS

## 2020-12-25 MED ORDER — CHLORHEXIDINE GLUCONATE 0.12 % MT SOLN: 15.0000 mL | Freq: Once | OROMUCOSAL | Status: AC

## 2020-12-25 MED ORDER — SCOPOLAMINE 1 MG/3DAYS TD PT72
MEDICATED_PATCH | TRANSDERMAL | Status: AC
  Filled 2020-12-25: qty 1

## 2020-12-25 MED ORDER — ACETAMINOPHEN 500 MG PO TABS
ORAL_TABLET | ORAL | Status: AC
  Filled 2020-12-25: qty 2

## 2020-12-25 MED ORDER — EPHEDRINE SULFATE 50 MG/ML IJ SOLN
INTRAMUSCULAR | Status: DC | PRN
  Administered 2020-12-25: 10 mg via INTRAVENOUS
  Administered 2020-12-25: 15 mg via INTRAVENOUS

## 2020-12-25 MED ORDER — CELECOXIB 200 MG PO CAPS: 200.0000 mg | ORAL_CAPSULE | ORAL | Status: AC

## 2020-12-25 MED ORDER — BUPIVACAINE-EPINEPHRINE (PF) 0.25% -1:200000 IJ SOLN
INTRAMUSCULAR | Status: DC | PRN
  Administered 2020-12-25: 30 mL

## 2020-12-25 MED ORDER — ACETAMINOPHEN 500 MG PO TABS
1000.0000 mg | ORAL_TABLET | ORAL | Status: AC
  Administered 2020-12-25: 1000 mg via ORAL

## 2020-12-25 MED ORDER — CHLORHEXIDINE GLUCONATE CLOTH 2 % EX PADS
6.0000 | MEDICATED_PAD | Freq: Once | CUTANEOUS | Status: AC
  Administered 2020-12-25: 6 via TOPICAL

## 2020-12-25 MED ORDER — GLYCOPYRROLATE 0.2 MG/ML IJ SOLN
INTRAMUSCULAR | Status: DC | PRN
  Administered 2020-12-25: .2 mg via INTRAVENOUS

## 2020-12-25 MED ORDER — HYDROCODONE-ACETAMINOPHEN 5-325 MG PO TABS: 1.0000 | ORAL_TABLET | ORAL | 0 refills | Status: DC | PRN

## 2020-12-25 MED ORDER — CHLORHEXIDINE GLUCONATE CLOTH 2 % EX PADS: 6.0000 | MEDICATED_PAD | Freq: Once | CUTANEOUS | Status: DC

## 2020-12-25 MED ORDER — LACTATED RINGERS IV SOLN: INTRAVENOUS | Status: DC

## 2020-12-25 MED ORDER — SCOPOLAMINE 1 MG/3DAYS TD PT72
1.0000 | MEDICATED_PATCH | Freq: Once | TRANSDERMAL | Status: DC
  Administered 2020-12-25: 1.5 mg via TRANSDERMAL

## 2020-12-25 MED ORDER — ORAL CARE MOUTH RINSE: 15.0000 mL | Freq: Once | OROMUCOSAL | Status: AC

## 2020-12-25 MED ORDER — OXYCODONE HCL 5 MG PO TABS
5.0000 mg | ORAL_TABLET | Freq: Once | ORAL | Status: AC | PRN
  Administered 2020-12-25: 5 mg via ORAL

## 2020-12-25 MED ORDER — PROPOFOL 500 MG/50ML IV EMUL
INTRAVENOUS | Status: AC
  Filled 2020-12-25: qty 50

## 2020-12-25 MED ORDER — BUPIVACAINE-EPINEPHRINE (PF) 0.25% -1:200000 IJ SOLN
INTRAMUSCULAR | Status: AC
  Filled 2020-12-25: qty 30

## 2020-12-25 SURGICAL SUPPLY — 50 items
ADH SKN CLS APL DERMABOND .7 (GAUZE/BANDAGES/DRESSINGS) ×1
APL PRP STRL LF DISP 70% ISPRP (MISCELLANEOUS) ×1
BAG SPEC RTRVL LRG 6X4 10 (ENDOMECHANICALS) ×1
CANNULA REDUC XI 12-8 STAPL (CANNULA) ×2
CANNULA REDUCER 12-8 DVNC XI (CANNULA) ×1 IMPLANT
CHLORAPREP W/TINT 26 (MISCELLANEOUS) ×2 IMPLANT
CLIP VESOLOCK MED LG 6/CT (CLIP) ×2 IMPLANT
COVER WAND RF STERILE (DRAPES) ×2 IMPLANT
DECANTER SPIKE VIAL GLASS SM (MISCELLANEOUS) ×2 IMPLANT
DEFOGGER SCOPE WARMER CLEARIFY (MISCELLANEOUS) ×2 IMPLANT
DERMABOND ADVANCED (GAUZE/BANDAGES/DRESSINGS) ×1
DERMABOND ADVANCED .7 DNX12 (GAUZE/BANDAGES/DRESSINGS) ×1 IMPLANT
DRAPE ARM DVNC X/XI (DISPOSABLE) ×4 IMPLANT
DRAPE COLUMN DVNC XI (DISPOSABLE) ×1 IMPLANT
DRAPE DA VINCI XI ARM (DISPOSABLE) ×8
DRAPE DA VINCI XI COLUMN (DISPOSABLE) ×2
ELECT CAUTERY BLADE 6.4 (BLADE) ×2 IMPLANT
ELECT REM PT RETURN 9FT ADLT (ELECTROSURGICAL) ×2
ELECTRODE REM PT RTRN 9FT ADLT (ELECTROSURGICAL) ×1 IMPLANT
GLOVE SURG ENC MOIS LTX SZ7 (GLOVE) ×4 IMPLANT
GOWN STRL REUS W/ TWL LRG LVL3 (GOWN DISPOSABLE) ×4 IMPLANT
GOWN STRL REUS W/TWL LRG LVL3 (GOWN DISPOSABLE) ×8
IRRIGATION STRYKERFLOW (MISCELLANEOUS) IMPLANT
IRRIGATOR STRYKERFLOW (MISCELLANEOUS)
IV NS 1000ML (IV SOLUTION)
IV NS 1000ML BAXH (IV SOLUTION) IMPLANT
KIT PINK PAD W/HEAD ARE REST (MISCELLANEOUS) ×2
KIT PINK PAD W/HEAD ARM REST (MISCELLANEOUS) ×1 IMPLANT
LABEL OR SOLS (LABEL) ×2 IMPLANT
MANIFOLD NEPTUNE II (INSTRUMENTS) ×2 IMPLANT
NEEDLE HYPO 22GX1.5 SAFETY (NEEDLE) ×2 IMPLANT
NS IRRIG 500ML POUR BTL (IV SOLUTION) ×2 IMPLANT
OBTURATOR OPTICAL STANDARD 8MM (TROCAR) ×2
OBTURATOR OPTICAL STND 8 DVNC (TROCAR) ×1
OBTURATOR OPTICALSTD 8 DVNC (TROCAR) ×1 IMPLANT
PACK LAP CHOLECYSTECTOMY (MISCELLANEOUS) ×2 IMPLANT
PENCIL ELECTRO HAND CTR (MISCELLANEOUS) ×2 IMPLANT
POUCH SPECIMEN RETRIEVAL 10MM (ENDOMECHANICALS) ×2 IMPLANT
SEAL CANN UNIV 5-8 DVNC XI (MISCELLANEOUS) ×3 IMPLANT
SEAL XI 5MM-8MM UNIVERSAL (MISCELLANEOUS) ×6
SET TUBE SMOKE EVAC HIGH FLOW (TUBING) ×2 IMPLANT
SOLUTION ELECTROLUBE (MISCELLANEOUS) ×2 IMPLANT
SPONGE LAP 18X18 RF (DISPOSABLE) ×2 IMPLANT
SPONGE LAP 4X18 RFD (DISPOSABLE) IMPLANT
STAPLER CANNULA SEAL DVNC XI (STAPLE) ×1 IMPLANT
STAPLER CANNULA SEAL XI (STAPLE) ×2
SUT MNCRL AB 4-0 PS2 18 (SUTURE) ×2 IMPLANT
SUT VICRYL 0 AB UR-6 (SUTURE) ×4 IMPLANT
TAPE TRANSPORE STRL 2 31045 (GAUZE/BANDAGES/DRESSINGS) ×2 IMPLANT
TROCAR BALLN GELPORT 12X130M (ENDOMECHANICALS) ×1 IMPLANT

## 2020-12-25 NOTE — Op Note (Signed)
Robotic assisted laparoscopic Cholecystectomy  Pre-operative Diagnosis: cholecystitis  Post-operative Diagnosis: same  Procedure:  Robotic assisted laparoscopic Cholecystectomy  Surgeon: Sterling Big, MD FACS  Anesthesia: Gen. with endotracheal tube  Findings: mild Cholecystitis   Estimated Blood Loss: 5cc       Specimens: Gallbladder           Complications: none   Procedure Details  The patient was seen again in the Holding Room. The benefits, complications, treatment options, and expected outcomes were discussed with the patient. The risks of bleeding, infection, recurrence of symptoms, failure to resolve symptoms, bile duct damage, bile duct leak, retained common bile duct stone, bowel injury, any of which could require further surgery and/or ERCP, stent, or papillotomy were reviewed with the patient. The likelihood of improving the patient's symptoms with return to their baseline status is good.  The patient and/or family concurred with the proposed plan, giving informed consent.  The patient was taken to Operating Room, identified  and the procedure verified as Laparoscopic Cholecystectomy.  A Time Out was held and the above information confirmed.  Prior to the induction of general anesthesia, antibiotic prophylaxis was administered. VTE prophylaxis was in place. General endotracheal anesthesia was then administered and tolerated well. After the induction, the abdomen was prepped with Chloraprep and draped in the sterile fashion. The patient was positioned in the supine position.  Cut down technique was used to enter the abdominal cavity and a Hasson trochar was placed after two vicryl stitches were anchored to the fascia. Pneumoperitoneum was then created with CO2 and tolerated well without any adverse changes in the patient's vital signs.  Three 8-mm ports were placed under direct vision. All skin incisions  were infiltrated with a local anesthetic agent before making the incision  and placing the trocars.   The patient was positioned  in reverse Trendelenburg, robot was brought to the surgical field and docked in the standard fashion.  We made sure all the instrumentation was kept indirect view at all times and that there were no collision between the arms. I scrubbed out and went to the console.  The gallbladder was identified, the fundus grasped and retracted cephalad. Adhesions were lysed bluntly. The infundibulum was grasped and retracted laterally, exposing the peritoneum overlying the triangle of Calot. This was then divided and exposed in a blunt fashion. An extended critical view of the cystic duct and cystic artery was obtained.  The cystic duct was clearly identified and bluntly dissected.   Artery and duct were double clipped and divided. Using ICG cholangiography we visualized the cystic duct and CBD, no injuries or leaks observed. The gallbladder was taken from the gallbladder fossa in a retrograde fashion with the electrocautery.  Hemostasis was achieved with the electrocautery. nspection of the right upper quadrant was performed. No bleeding, bile duct injury or leak, or bowel injury was noted. Robotic instruments and robotic arms were undocked in the standard fashion.  I scrubbed back in.  The gallbladder was removed and placed in an Endocatch bag.   Pneumoperitoneum was released.  The periumbilical port site was closed with interrumpted 0 Vicryl sutures. 4-0 subcuticular Monocryl was used to close the skin. Dermabond was  applied.  The patient was then extubated and brought to the recovery room in stable condition. Sponge, lap, and needle counts were correct at closure and at the conclusion of the case.               Sterling Big, MD, FACS

## 2020-12-25 NOTE — Transfer of Care (Signed)
Immediate Anesthesia Transfer of Care Note  Patient: April Tran  Procedure(s) Performed: XI ROBOTIC ASSISTED LAPAROSCOPIC CHOLECYSTECTOMY (N/A )  Patient Location: [place]   Anesthesia Type:General  Level of Consciousness: sedated  Airway & Oxygen Therapy: Patient Spontanous Breathing and Patient connected to face mask oxygen  Post-op Assessment: Report given to RN and Post -op Vital signs reviewed and stable  Post vital signs: Reviewed and stable  Last Vitals:  Vitals Value Taken Time  BP 132/67 12/25/20 1251  Temp 36.2 C 12/25/20 1251  Pulse 83 12/25/20 1258  Resp 23 12/25/20 1258  SpO2 100 % 12/25/20 1258  Vitals shown include unvalidated device data.  Last Pain:  Vitals:   12/25/20 1251  TempSrc:   PainSc: Asleep         Complications: No complications documented.

## 2020-12-25 NOTE — Discharge Instructions (Addendum)
Laparoscopic Cholecystectomy, Care After  ° °These instructions give you information on caring for yourself after your procedure. Your doctor may also give you more specific instructions. Call your doctor if you have any problems or questions after your procedure.  °HOME CARE  °Change your bandages (dressings) as told by your doctor.  °Keep the wound dry and clean. Wash the wound gently with soap and water. Pat the wound dry with a clean towel.  °Do not take baths, swim, or use hot tubs for 2 weeks, or as told by your doctor.  °Only take medicine as told by your doctor.  °Eat a normal diet as told by your doctor.  °Do not lift anything heavier than 10 pounds (4.5 kg) until your doctor says it is okay.  °Do not play contact sports for 1 week, or as told by your doctor. °GET HELP IF:  °Your wound is red, puffy (swollen), or painful.  °You have yellowish-white fluid (pus) coming from the wound.  °You have fluid draining from the wound for more than 1 day.  °You have a bad smell coming from the wound.  °Your wound breaks open. °GET HELP RIGHT AWAY IF:  °You have trouble breathing.  °You have chest pain.  °You have a fever >101  °You have pain in the shoulders (shoulder strap areas) that is getting worse.  °You feel dizzy or pass out (faint).  °You have severe belly (abdominal) pain.  °You feel sick to your stomach (nauseous) or throw up (vomit) for more than 1 day. ° ° ° °AMBULATORY SURGERY  °DISCHARGE INSTRUCTIONS ° ° °1) The drugs that you were given will stay in your system until tomorrow so for the next 24 hours you should not: ° °A) Drive an automobile °B) Make any legal decisions °C) Drink any alcoholic beverage ° ° °2) You may resume regular meals tomorrow.  Today it is better to start with liquids and gradually work up to solid foods. ° °You may eat anything you prefer, but it is better to start with liquids, then soup and crackers, and gradually work up to solid foods. ° ° °3) Please notify your doctor  immediately if you have any unusual bleeding, trouble breathing, redness and pain at the surgery site, drainage, fever, or pain not relieved by medication. ° ° ° °4) Additional Instructions: ° ° ° ° ° ° ° °Please contact your physician with any problems or Same Day Surgery at [phone], Monday through Friday 6 am to 4 pm, or [place] [facility] [place] [facility] number at [phone]. ° ° °

## 2020-12-25 NOTE — Anesthesia Procedure Notes (Signed)
Procedure Name: Intubation Date/Time: 12/25/2020 11:30 AM Performed by: Justus Memory, CRNA Pre-anesthesia Checklist: Patient identified, Patient being monitored, Timeout performed, Emergency Drugs available and Suction available Patient Re-evaluated:Patient Re-evaluated prior to induction Oxygen Delivery Method: Circle system utilized Preoxygenation: Pre-oxygenation with 100% oxygen Induction Type: IV induction Ventilation: Mask ventilation with difficulty and Oral airway inserted - appropriate to patient size Laryngoscope Size: Mac, 3 and McGraph Grade View: Grade I Tube type: Oral Tube size: 7.0 mm Number of attempts: 1 Airway Equipment and Method: Stylet,  Patient positioned with wedge pillow and Video-laryngoscopy Placement Confirmation: ETT inserted through vocal cords under direct vision,  positive ETCO2 and breath sounds checked- equal and bilateral Secured at: 21 cm Tube secured with: Tape Dental Injury: Teeth and Oropharynx as per pre-operative assessment  Difficulty Due To: Difficulty was anticipated and Difficult Airway- due to anterior larynx Future Recommendations: Recommend- induction with short-acting agent, and alternative techniques readily available

## 2020-12-25 NOTE — Anesthesia Preprocedure Evaluation (Signed)
Anesthesia Evaluation  Patient identified by MRN, date of birth, ID band Patient awake    Reviewed: Allergy & Precautions, H&P , NPO status , Patient's Chart, lab work & pertinent test results  History of Anesthesia Complications (+) PONVNegative for: history of anesthetic complications  Airway Mallampati: III  TM Distance: >3 FB Neck ROM: full    Dental  (+) Teeth Intact   Pulmonary sleep apnea (suspected, not diagnosed) , neg COPD,    breath sounds clear to auscultation       Cardiovascular hypertension, (-) angina(-) Past MI and (-) Cardiac Stents (-) dysrhythmias  Rhythm:regular Rate:Normal     Neuro/Psych PSYCHIATRIC DISORDERS Depression negative neurological ROS     GI/Hepatic negative GI ROS, Neg liver ROS,   Endo/Other  negative endocrine ROS  Renal/GU      Musculoskeletal   Abdominal   Peds  Hematology negative hematology ROS (+)   Anesthesia Other Findings Obesity, BMI 38  Past Medical History: No date: Depression  Past Surgical History: No date: TONSILLECTOMY 12/10/2016: TUBAL LIGATION; Bilateral     Comment:  Procedure: BILATERAL TUBAL LIGATION;  Surgeon: Elenora Fender              Ward, MD;  Location: [facility] ORS;  Service: Gynecology;                Laterality: Bilateral; No date: TUBAL LIGATION No date: WISDOM TOOTH EXTRACTION  BMI    Body Mass Index: 38.77 kg/m      Reproductive/Obstetrics negative OB ROS                             Anesthesia Physical Anesthesia Plan  ASA: II  Anesthesia Plan: General ETT   Post-op Pain Management:    Induction:   PONV Risk Score and Plan: Ondansetron, Dexamethasone, Midazolam, Treatment may vary due to age or medical condition and Scopolamine patch - Pre-op  Airway Management Planned:   Additional Equipment:   Intra-op Plan:   Post-operative Plan:   Informed Consent: I have reviewed the patients History and Physical,  chart, labs and discussed the procedure including the risks, benefits and alternatives for the proposed anesthesia with the patient or authorized representative who has indicated his/her understanding and acceptance.     Dental Advisory Given  Plan Discussed with: Anesthesiologist, CRNA and Surgeon  Anesthesia Plan Comments:         Anesthesia Quick Evaluation

## 2020-12-25 NOTE — Interval H&P Note (Signed)
History and Physical Interval Note:  12/25/2020 10:59 AM  April Tran  has presented today for surgery, with the diagnosis of cholecystitis.  The various methods of treatment have been discussed with the patient and family. After consideration of risks, benefits and other options for treatment, the patient has consented to  Procedure(s): XI ROBOTIC ASSISTED LAPAROSCOPIC CHOLECYSTECTOMY (N/A) as a surgical intervention.  The patient's history has been reviewed, patient examined, no change in status, stable for surgery.  I have reviewed the patient's chart and labs.  Questions were answered to the patient's satisfaction.     April Tran F Bryli Mantey

## 2020-12-26 LAB — SURGICAL PATHOLOGY

## 2020-12-26 NOTE — Anesthesia Postprocedure Evaluation (Signed)
Anesthesia Post Note  Patient: Esti L [place]  Procedure(s) Performed: XI ROBOTIC ASSISTED LAPAROSCOPIC CHOLECYSTECTOMY (N/A )  Patient location during evaluation: PACU Anesthesia Type: General Level of consciousness: awake and alert Pain management: pain level controlled Vital Signs Assessment: post-procedure vital signs reviewed and stable Respiratory status: spontaneous breathing, nonlabored ventilation and respiratory function stable Cardiovascular status: blood pressure returned to baseline and stable Postop Assessment: no apparent nausea or vomiting Anesthetic complications: no   No complications documented.   Last Vitals:  Vitals:   12/25/20 1400 12/25/20 1418  BP: 114/64 (!) 109/57  Pulse: 81 64  Resp:  16  Temp: 36.4 C (!) 36.2 C  SpO2: 98% 100%    Last Pain:  Vitals:   12/25/20 1418  TempSrc: Oral  PainSc: 3                  Karleen Hampshire

## 2020-12-30 ENCOUNTER — Telehealth: Payer: Self-pay | Admitting: *Deleted

## 2020-12-30 NOTE — Telephone Encounter (Signed)
Let patient know that her letter is ready for pick up at the front office.

## 2020-12-30 NOTE — Telephone Encounter (Signed)
Patient called and wants to pick up a return to work note that she can return to work after May 4th and with what restrictions she will have. Please call when ready

## 2021-01-05 ENCOUNTER — Other Ambulatory Visit: Payer: Self-pay

## 2021-01-05 ENCOUNTER — Ambulatory Visit (INDEPENDENT_AMBULATORY_CARE_PROVIDER_SITE_OTHER): Payer: Medicaid Other | Admitting: Surgery

## 2021-01-05 ENCOUNTER — Ambulatory Visit: Payer: Self-pay | Admitting: Surgery

## 2021-01-05 DIAGNOSIS — K810 Acute cholecystitis: Secondary | ICD-10-CM

## 2021-01-05 MED ORDER — ONDANSETRON HCL 4 MG PO TABS: 4.0000 mg | ORAL_TABLET | Freq: Three times a day (TID) | ORAL | 0 refills | Status: DC | PRN

## 2021-01-05 MED ORDER — GABAPENTIN 300 MG PO CAPS: 300.0000 mg | ORAL_CAPSULE | Freq: Three times a day (TID) | ORAL | 0 refills | Status: AC

## 2021-01-05 MED ORDER — CYCLOBENZAPRINE HCL 5 MG PO TABS: 5.0000 mg | ORAL_TABLET | Freq: Three times a day (TID) | ORAL | 0 refills | Status: DC

## 2021-01-05 NOTE — Patient Instructions (Addendum)
You may try Tylenol and Ibuprofen. Please pick up your medication at the pharmacy.   Please keep your follow up appointment listed below.

## 2021-01-14 ENCOUNTER — Other Ambulatory Visit: Payer: Self-pay

## 2021-01-14 ENCOUNTER — Telehealth (INDEPENDENT_AMBULATORY_CARE_PROVIDER_SITE_OTHER): Payer: Self-pay | Admitting: Surgery

## 2021-01-14 DIAGNOSIS — Z09 Encounter for follow-up examination after completed treatment for conditions other than malignant neoplasm: Secondary | ICD-10-CM

## 2021-01-14 NOTE — Progress Notes (Signed)
Called pt about 3 times. Voicemail was full. Unable to perform virtual visit

## 2021-01-25 ENCOUNTER — Emergency Department
Admission: EM | Admit: 2021-01-25 | Discharge: 2021-01-25 | Disposition: A | Discharge status: Home / Self Care | Payer: Medicaid Other | Attending: Emergency Medicine | Admitting: Emergency Medicine

## 2021-01-25 ENCOUNTER — Other Ambulatory Visit: Payer: Self-pay

## 2021-01-25 DIAGNOSIS — J029 Acute pharyngitis, unspecified: Secondary | ICD-10-CM | POA: Insufficient documentation

## 2021-01-25 DIAGNOSIS — R519 Headache, unspecified: Secondary | ICD-10-CM | POA: Insufficient documentation

## 2021-01-25 DIAGNOSIS — R059 Cough, unspecified: Secondary | ICD-10-CM | POA: Insufficient documentation

## 2021-01-25 DIAGNOSIS — J3489 Other specified disorders of nose and nasal sinuses: Secondary | ICD-10-CM | POA: Insufficient documentation

## 2021-01-25 DIAGNOSIS — Z20822 Contact with and (suspected) exposure to covid-19: Secondary | ICD-10-CM | POA: Insufficient documentation

## 2021-01-25 LAB — RESP PANEL BY RT-PCR (FLU A&B, COVID) ARPGX2
Influenza A by PCR: NEGATIVE
Influenza B by PCR: NEGATIVE
SARS Coronavirus 2 by RT PCR: NEGATIVE

## 2021-01-25 LAB — GROUP A STREP BY PCR: Group A Strep by PCR: NOT DETECTED

## 2021-01-25 NOTE — ED Provider Notes (Signed)
[facility]-EMERGENCY DEPARTMENT  ____________________________________________  Time seen: Approximately 6:50 PM  I have reviewed the triage vital signs and the nursing notes.   HISTORY  Chief Complaint Sore Throat   Historian Patient     HPI April Tran is a 30 y.o. female presents to the emergency department with pharyngitis, headache, rhinorrhea, nasal congestion and sporadic cough since being exposed to COVID-19.  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  Patient denies chest pain, chest tightness or abdominal pain.  No vomiting or diarrhea.   Past Medical History:  Diagnosis Date  . Depression      Immunizations up to date:  Yes.     Past Medical History:  Diagnosis Date  . Depression     Patient Active Problem List   Diagnosis Date Noted  . Gestational hypertension, third trimester 12/09/2016  . Post-dates pregnancy 12/09/2016  . Pregnancy 12/03/2016  . Indication for care in labor and delivery, antepartum 11/26/2016  . Labor and delivery, indication for care 11/07/2016  . Uterine contractions during pregnancy 10/15/2016  . Fall at home 07/26/2016    Past Surgical History:  Procedure Laterality Date  . TONSILLECTOMY    . TUBAL LIGATION Bilateral 12/10/2016   Procedure: BILATERAL TUBAL LIGATION;  Surgeon: Elenora Fender Ward, MD;  Location: [facility] ORS;  Service: Gynecology;  Laterality: Bilateral;  . TUBAL LIGATION    . WISDOM TOOTH EXTRACTION      Prior to Admission medications   Medication Sig Start Date End Date Taking? Authorizing Provider  cyclobenzaprine (FLEXERIL) 5 MG tablet Take 1 tablet (5 mg total) by mouth 3 (three) times daily. 01/05/21   Pabon, Diego F, MD  gabapentin (NEURONTIN) 300 MG capsule Take 1 capsule (300 mg total) by mouth 3 (three) times daily. 01/05/21   Pabon, Merri Ray, MD  HYDROcodone-acetaminophen (NORCO/VICODIN) 5-325 MG tablet Take 1-2 tablets by mouth every 4 (four) hours as  needed for moderate pain. 12/25/20   Pabon, Diego F, MD  ibuprofen (ADVIL) 200 MG tablet Take 400 mg by mouth every 6 (six) hours as needed for moderate pain or headache.    [provider]  ondansetron (ZOFRAN ODT) 4 MG disintegrating tablet Take 1 tablet (4 mg total) by mouth every 8 (eight) hours as needed for nausea or vomiting. 01/03/20   Minna Antis, MD  ondansetron (ZOFRAN) 4 MG tablet Take 1 tablet (4 mg total) by mouth every 8 (eight) hours as needed for nausea or vomiting. 01/05/21   Pabon, Diego F, MD  sertraline (ZOLOFT) 100 MG tablet Take 100 mg by mouth at bedtime as needed (depression).    [provider]  traZODone (DESYREL) 50 MG tablet Take 50 mg by mouth at bedtime.    [provider]    Allergies Morphine and related  Family History  Problem Relation Age of Onset  . Hypertension Mother   . Depression Mother   . Hypertension Father   . Diabetes Father   . Depression Father   . Hypertension Maternal Aunt   . Depression Maternal Aunt   . Hypertension Paternal Aunt   . Hypertension Maternal Grandmother   . Depression Maternal Grandmother   . Hypertension Maternal Grandfather   . Depression Maternal Grandfather   . Heart disease Maternal Grandfather   . Hypertension Paternal Grandmother   . Depression Paternal Grandmother   . Hypertension Paternal Grandfather   . Depression Paternal Grandfather     Social History Social History  Tobacco Use  . Smoking status: Never Smoker  . Smokeless tobacco: Never Used  Substance Use Topics  . Alcohol use: No  . Drug use: No     Review of Systems  Constitutional: Patient has fever.  Eyes: No visual changes. No discharge ENT: Patient has congestion.  Cardiovascular: no chest pain. Respiratory: Patient has cough.  Gastrointestinal: No abdominal pain.  No nausea, no vomiting. Patient had diarrhea.  Genitourinary: Negative for dysuria. No hematuria Musculoskeletal: Patient has myalgias.   Skin: Negative for rash, abrasions, lacerations, ecchymosis. Neurological: Patient has headache, no focal weakness or numbness.    ____________________________________________   PHYSICAL EXAM:  VITAL SIGNS: ED Triage Vitals  Enc Vitals Group     BP 01/25/21 1827 131/86     Pulse Rate 01/25/21 1825 82     Resp 01/25/21 1825 20     Temp 01/25/21 1825 98.9 F (37.2 C)     Temp Source 01/25/21 1825 Oral     SpO2 01/25/21 1825 98 %     Weight 01/25/21 1825 246 lb (111.6 kg)     Height 01/25/21 1825 5\' 6"  (1.676 m)     Head Circumference --      Peak Flow --      Pain Score 01/25/21 1825 0     Pain Loc --      Pain Edu? --      Excl. in GC? --      Constitutional: Alert and oriented. Patient is lying supine. Eyes: Conjunctivae are normal. PERRL. EOMI. Head: Atraumatic. ENT:      Ears: Tympanic membranes are mildly injected with mild effusion bilaterally.       Nose: No congestion/rhinnorhea.      Mouth/Throat: Mucous membranes are moist. Posterior pharynx is mildly erythematous.  Hematological/Lymphatic/Immunilogical: No cervical lymphadenopathy.  Cardiovascular: Normal rate, regular rhythm. Normal S1 and S2.  Good peripheral circulation. Respiratory: Normal respiratory effort without tachypnea or retractions. Lungs CTAB. Good air entry to the bases with no decreased or absent breath sounds. Gastrointestinal: Bowel sounds 4 quadrants. Soft and nontender to palpation. No guarding or rigidity. No palpable masses. No distention. No CVA tenderness. Musculoskeletal: Full range of motion to all extremities. No gross deformities appreciated. Neurologic:  Normal speech and language. No gross focal neurologic deficits are appreciated.  Skin:  Skin is warm, dry and intact. No rash noted. Psychiatric: Mood and affect are normal. Speech and behavior are normal. Patient exhibits appropriate insight and judgement.    ____________________________________________   LABS (all labs  ordered are listed, but only abnormal results are displayed)  Labs Reviewed  GROUP A STREP BY PCR  RESP PANEL BY RT-PCR (FLU A&B, COVID) ARPGX2   ____________________________________________  EKG   ____________________________________________  RADIOLOGY   No results found.  ____________________________________________    PROCEDURES  Procedure(s) performed:     Procedures     Medications - No data to display   ____________________________________________   INITIAL IMPRESSION / ASSESSMENT AND PLAN / ED COURSE  Pertinent labs & imaging results that were available during my care of the patient were reviewed by me and considered in my medical decision making (see chart for details).      Assessment and plan:  Headache: Pharyngitis:  30 year old female presents to the emergency department with pharyngitis for the past 3 days and low-grade fever at home.  Patient was hypertensive at triage but vital signs were otherwise reassuring.  She tested negative for COVID-19 and influenza and group A strep.  Rest and hydration were encouraged at home.  Supportive measures were encouraged at home.  All patient questions were answered.      ____________________________________________  FINAL CLINICAL IMPRESSION(S) / ED DIAGNOSES  Final diagnoses:  Pharyngitis, unspecified etiology      NEW MEDICATIONS STARTED DURING THIS VISIT:  ED Discharge Orders    None          This chart was dictated using voice recognition software/Dragon. Despite best efforts to proofread, errors can occur which can change the meaning. Any change was purely unintentional.     Orvil Feil, PA-C 01/25/21 2059    Phineas Semen, MD 01/26/21 [phone]

## 2021-01-25 NOTE — ED Triage Notes (Signed)
Pt to ED POV for sore throat x 3 days and fever. Has been exposed to covid

## 2021-02-20 ENCOUNTER — Emergency Department
Admission: EM | Admit: 2021-02-20 | Discharge: 2021-02-20 | Disposition: A | Discharge status: Home / Self Care | Payer: Medicaid Other | Attending: Emergency Medicine | Admitting: Emergency Medicine

## 2021-02-20 ENCOUNTER — Encounter: Payer: Self-pay | Admitting: Emergency Medicine

## 2021-02-20 ENCOUNTER — Emergency Department: Payer: Medicaid Other

## 2021-02-20 ENCOUNTER — Other Ambulatory Visit: Payer: Self-pay

## 2021-02-20 DIAGNOSIS — S91311A Laceration without foreign body, right foot, initial encounter: Secondary | ICD-10-CM | POA: Insufficient documentation

## 2021-02-20 DIAGNOSIS — S99922A Unspecified injury of left foot, initial encounter: Secondary | ICD-10-CM | POA: Diagnosis present

## 2021-02-20 DIAGNOSIS — S91332A Puncture wound without foreign body, left foot, initial encounter: Secondary | ICD-10-CM

## 2021-02-20 DIAGNOSIS — S91312A Laceration without foreign body, left foot, initial encounter: Secondary | ICD-10-CM | POA: Diagnosis not present

## 2021-02-20 DIAGNOSIS — W268XXA Contact with other sharp object(s), not elsewhere classified, initial encounter: Secondary | ICD-10-CM | POA: Insufficient documentation

## 2021-02-20 DIAGNOSIS — Z23 Encounter for immunization: Secondary | ICD-10-CM | POA: Insufficient documentation

## 2021-02-20 DIAGNOSIS — S90852A Superficial foreign body, left foot, initial encounter: Secondary | ICD-10-CM

## 2021-02-20 MED ORDER — TETANUS-DIPHTH-ACELL PERTUSSIS 5-2.5-18.5 LF-MCG/0.5 IM SUSY
0.5000 mL | PREFILLED_SYRINGE | Freq: Once | INTRAMUSCULAR | Status: AC
  Administered 2021-02-20: 0.5 mL via INTRAMUSCULAR
  Filled 2021-02-20: qty 0.5

## 2021-02-20 MED ORDER — CEPHALEXIN 500 MG PO CAPS
500.0000 mg | ORAL_CAPSULE | Freq: Once | ORAL | Status: AC
  Administered 2021-02-20: 500 mg via ORAL
  Filled 2021-02-20: qty 1

## 2021-02-20 MED ORDER — LIDOCAINE HCL (PF) 1 % IJ SOLN
5.0000 mL | Freq: Once | INTRAMUSCULAR | Status: AC
  Administered 2021-02-20: 5 mL
  Filled 2021-02-20: qty 5

## 2021-02-20 MED ORDER — CIPROFLOXACIN HCL 500 MG PO TABS
500.0000 mg | ORAL_TABLET | Freq: Once | ORAL | Status: AC
  Administered 2021-02-20: 500 mg via ORAL
  Filled 2021-02-20: qty 1

## 2021-02-20 MED ORDER — CIPROFLOXACIN HCL 500 MG PO TABS: 500.0000 mg | ORAL_TABLET | Freq: Two times a day (BID) | ORAL | 0 refills | Status: AC

## 2021-02-20 MED ORDER — CEPHALEXIN 500 MG PO CAPS: 500.0000 mg | ORAL_CAPSULE | Freq: Three times a day (TID) | ORAL | 0 refills | Status: AC

## 2021-02-20 NOTE — Discharge Instructions (Addendum)
Take the antibiotics as directed.  You may soak the foot in warm Epson salt to promote healing.  Return to the ED for any signs of worsening infection including purulent drainage, or redness or swelling over the top of the foot.

## 2021-02-20 NOTE — ED Triage Notes (Signed)
Pt to ED via POV with c/o laceration to R foot, pt states stepped on chicken coop wire earlier today. Unknown when last tetanus shot was. Pt reports 2 pieces possibly still in her foot.

## 2021-02-20 NOTE — ED Notes (Signed)
Patient wounds dressed with gauze and wrapped with Kerlix.

## 2021-02-20 NOTE — ED Provider Notes (Addendum)
[facility] Emergency Department Provider Note ____________________________________________  Time seen: 1918  I have reviewed the triage vital signs and the nursing notes.  HISTORY  Chief Complaint  Laceration   HPI April Tran is a 30 y.o. female April Tran herself to the ED for evaluation of laceration to the right foot.  Patient reports that been on a chicken coop wire yesterday.  Patient was wearing crocs at the time of the incident, and denies any difficulty bringing her foot from the chicken wire.  She is unclear of her last tetanus status.  She also reports concern for possible retained foreign body into the foot.  She denies any other injury at this time.  Past Medical History:  Diagnosis Date   Depression     Patient Active Problem List   Diagnosis Date Noted   Gestational hypertension, third trimester 12/09/2016   Post-dates pregnancy 12/09/2016   Pregnancy 12/03/2016   Indication for care in labor and delivery, antepartum 11/26/2016   Labor and delivery, indication for care 11/07/2016   Uterine contractions during pregnancy 10/15/2016   Fall at home 07/26/2016    Past Surgical History:  Procedure Laterality Date   TONSILLECTOMY     TUBAL LIGATION Bilateral 12/10/2016   Procedure: BILATERAL TUBAL LIGATION;  Surgeon: Elenora Fender Ward, MD;  Location: [facility] ORS;  Service: Gynecology;  Laterality: Bilateral;   TUBAL LIGATION     WISDOM TOOTH EXTRACTION      Prior to Admission medications   Medication Sig Start Date End Date Taking? Authorizing Provider  cephALEXin (KEFLEX) 500 MG capsule Take 1 capsule (500 mg total) by mouth 3 (three) times daily for 7 days. 02/20/21 02/27/21 Yes Lamanda Rudder, Charlesetta Ivory, PA-C  ciprofloxacin (CIPRO) 500 MG tablet Take 1 tablet (500 mg total) by mouth 2 (two) times daily for 5 days. 02/20/21 02/25/21 Yes Nami Strawder, Charlesetta Ivory, PA-C  cyclobenzaprine (FLEXERIL) 5 MG tablet Take 1 tablet (5 mg total) by mouth 3 (three)  times daily. 01/05/21   Pabon, Diego F, MD  gabapentin (NEURONTIN) 300 MG capsule Take 1 capsule (300 mg total) by mouth 3 (three) times daily. 01/05/21   Pabon, Merri Ray, MD  HYDROcodone-acetaminophen (NORCO/VICODIN) 5-325 MG tablet Take 1-2 tablets by mouth every 4 (four) hours as needed for moderate pain. 12/25/20   Pabon, Diego F, MD  ibuprofen (ADVIL) 200 MG tablet Take 400 mg by mouth every 6 (six) hours as needed for moderate pain or headache.    [provider]  ondansetron (ZOFRAN ODT) 4 MG disintegrating tablet Take 1 tablet (4 mg total) by mouth every 8 (eight) hours as needed for nausea or vomiting. 01/03/20   Minna Antis, MD  ondansetron (ZOFRAN) 4 MG tablet Take 1 tablet (4 mg total) by mouth every 8 (eight) hours as needed for nausea or vomiting. 01/05/21   Pabon, Diego F, MD  sertraline (ZOLOFT) 100 MG tablet Take 100 mg by mouth at bedtime as needed (depression).    [provider]  traZODone (DESYREL) 50 MG tablet Take 50 mg by mouth at bedtime.    [provider]    Allergies Morphine and related  Family History  Problem Relation Age of Onset   Hypertension Mother    Depression Mother    Hypertension Father    Diabetes Father    Depression Father    Hypertension Maternal Aunt    Depression Maternal Aunt    Hypertension Paternal Aunt    Hypertension Maternal Grandmother  Depression Maternal Grandmother    Hypertension Maternal Grandfather    Depression Maternal Grandfather    Heart disease Maternal Grandfather    Hypertension Paternal Grandmother    Depression Paternal Grandmother    Hypertension Paternal Grandfather    Depression Paternal Grandfather     Social History Social History   Tobacco Use   Smoking status: Never   Smokeless tobacco: Never  Substance Use Topics   Alcohol use: No   Drug use: No    Review of Systems  Constitutional: Negative for fever. Respiratory: Negative for shortness of  breath. Gastrointestinal: Negative for abdominal pain, vomiting and diarrhea. Genitourinary: Negative for dysuria. Musculoskeletal: Negative for back pain. Skin: Negative for rash.  Puncture wound to left foot as above. Neurological: Negative for headaches, focal weakness or numbness. ____________________________________________  PHYSICAL EXAM:  VITAL SIGNS: ED Triage Vitals [02/20/21 1819]  Enc Vitals Group     BP (!) 142/89     Pulse Rate 80     Resp 20     Temp 98 F (36.7 C)     Temp Source Oral     SpO2 98 %     Weight 246 lb (111.6 kg)     Height 5\' 6"  (1.676 m)     Head Circumference      Peak Flow      Pain Score 5     Pain Loc      Pain Edu?      Excl. in GC?     Constitutional: Alert and oriented. Well appearing and in no distress. Head: Normocephalic and atraumatic. Eyes: Conjunctivae are normal. Normal extraocular movements Cardiovascular: Normal rate, regular rhythm. Normal distal pulses. Respiratory: Normal respiratory effort.  Musculoskeletal: Nontender with normal range of motion in all extremities.  Neurologic:  Normal gait without ataxia. Normal speech and language. No gross focal neurologic deficits are appreciated. Skin:  Skin is warm, dry and intact. No rash noted.  Left foot with 2 distinct, punctate, puncture wounds to the plantar surface at the fourth and fifth MTPs.  No surrounding erythema is noted. ____________________________________________   RADIOLOGY  DG Left  Foot  IMPRESSION: No acute osseous abnormality.  No radiopaque foreign body. ____________________________________________  PROCEDURES  Tdap 0.5 ml IM Keflex 5 mg p.o. Cipro 500 mg p.o.  Procedures ____________________________________________   INITIAL IMPRESSION / ASSESSMENT AND PLAN / ED COURSE  As part of my medical decision making, I reviewed the following data within the electronic MEDICAL RECORD NUMBER Radiograph reviewed NAD and Notes from prior ED visits   Patient ED  evaluation of a plantar foot injury after she accidentally stepped on a chicken coop wire.  Patient presents with 2 plantar surface puncture wounds with no active bleeding.  No radiologic evidence of any retained foreign body.  She treated empirically with coverage for MSSA and Pseudomonas.  Return precautions have been reviewed.  Gillie L was evaluated in Emergency Department on 02/20/2021 for the symptoms described in the history of present illness. She was evaluated in the context of the global COVID-19 pandemic, which necessitated consideration that the patient might be at risk for infection with the SARS-CoV-2 virus that causes COVID-19. Institutional protocols and algorithms that pertain to the evaluation of patients at risk for COVID-19 are in a state of rapid change based on information released by regulatory bodies including the CDC and federal and state organizations. These policies and algorithms were followed during the patient's care in the ED. ____________________________________________  FINAL CLINICAL IMPRESSION(S) /  ED DIAGNOSES  Final diagnoses:  Puncture wound of left foot, initial encounter      Lissa Hoard, PA-C 02/20/21 1944    [address] Charlesetta Ivory, PA-C 02/20/21 1945    Dionne Bucy, MD 02/21/21 0015

## 2021-10-04 ENCOUNTER — Other Ambulatory Visit: Payer: Self-pay

## 2021-10-04 ENCOUNTER — Emergency Department: Payer: Medicaid Other

## 2021-10-04 ENCOUNTER — Emergency Department
Admission: EM | Admit: 2021-10-04 | Discharge: 2021-10-04 | Disposition: A | Discharge status: Home / Self Care | Payer: Medicaid Other | Attending: Emergency Medicine | Admitting: Emergency Medicine

## 2021-10-04 ENCOUNTER — Encounter: Payer: Self-pay | Admitting: Emergency Medicine

## 2021-10-04 DIAGNOSIS — J101 Influenza due to other identified influenza virus with other respiratory manifestations: Secondary | ICD-10-CM | POA: Diagnosis not present

## 2021-10-04 DIAGNOSIS — J111 Influenza due to unidentified influenza virus with other respiratory manifestations: Secondary | ICD-10-CM | POA: Diagnosis not present

## 2021-10-04 DIAGNOSIS — Z20822 Contact with and (suspected) exposure to covid-19: Secondary | ICD-10-CM | POA: Insufficient documentation

## 2021-10-04 DIAGNOSIS — R059 Cough, unspecified: Secondary | ICD-10-CM | POA: Diagnosis not present

## 2021-10-04 DIAGNOSIS — J029 Acute pharyngitis, unspecified: Secondary | ICD-10-CM | POA: Diagnosis not present

## 2021-10-04 DIAGNOSIS — R509 Fever, unspecified: Secondary | ICD-10-CM | POA: Diagnosis not present

## 2021-10-04 LAB — RESP PANEL BY RT-PCR (FLU A&B, COVID) ARPGX2
Influenza A by PCR: NEGATIVE
Influenza B by PCR: NEGATIVE
SARS Coronavirus 2 by RT PCR: NEGATIVE

## 2021-10-04 NOTE — ED Triage Notes (Signed)
Pt reports fever, sore throat, cough, bodyaches, and diarrhea since Friday. Pt would like a covid test

## 2021-10-04 NOTE — ED Provider Notes (Signed)
[facility] Provider Note    Event Date/Time   First MD Initiated Contact with Patient 10/04/21 1336     (approximate)   History   Sore Throat, Fever, and Generalized Body Aches   HPI  April Tran is a 31 y.o. female presents to the ER if with concerns of covid/flu.  Symptoms of sore throat, fever, bodyaches and diarrhea since FRiday.  No cp/sob.  Says her apple watch showed a low o2 of 92%.        Physical Exam   Triage Vital Signs: ED Triage Vitals  Enc Vitals Group     BP 10/04/21 1335 (!) 162/93     Pulse Rate 10/04/21 1335 89     Resp 10/04/21 1335 18     Temp 10/04/21 1335 (!) 97.5 F (36.4 C)     Temp Source 10/04/21 1335 Oral     SpO2 10/04/21 1335 99 %     Weight 10/04/21 1332 246 lb 14.6 oz (112 kg)     Height 10/04/21 1332 5\' 6"  (1.676 m)     Head Circumference --      Peak Flow --      Pain Score 10/04/21 1331 5     Pain Loc --      Pain Edu? --      Excl. in GC? --     Most recent vital signs: Vitals:   10/04/21 1335  BP: (!) 162/93  Pulse: 89  Resp: 18  Temp: (!) 97.5 F (36.4 C)  SpO2: 99%     General: Awake, no distress.   CV:  Good peripheral perfusion. regular rate and  rhythm Resp:  Normal effort. Lungs c ta Abd:  No distention.   Other:   Throat is mildly red, no swelling or exudate noted   ED Results / Procedures / Treatments   Labs (all labs ordered are listed, but only abnormal results are displayed) Labs Reviewed  RESP PANEL BY RT-PCR (FLU A&B, COVID) ARPGX2     EKG     RADIOLOGY cxr    PROCEDURES:   Procedures   MEDICATIONS ORDERED IN ED: Medications - No data to display   IMPRESSION / MDM / ASSESSMENT AND PLAN / ED COURSE  I reviewed the triage vital signs and the nursing notes.                              Differential diagnosis includes, but is not limited to, covid, influenza, cap, viral pharyngitis  Respiratory panel ordered Chest x-ray  I did review the  chest x-ray, do not see any concerns for pneumonia.  This was read by the radiologist as normal  The respiratory panel is still pending.  Patient would like to go home and I think that she can view this on her [facility] MyChart.  She is in agreement with this plan.  She is given a work note stating that she should remain out of work tomorrow, she can return on to see if her COVID test and influenza test are negative.  Instructions were given for how much length of time to remain out of work if those are positive.  Patient was discharged in stable condition we did discuss over-the-counter comfort measures.         FINAL CLINICAL IMPRESSION(S) / ED DIAGNOSES   Final diagnoses:  Influenza-like illness     Rx / DC Orders  ED Discharge Orders     None        Note:  This document was prepared using Dragon voice recognition software and may include unintentional dictation errors.    Faythe Ghee, PA-C 10/04/21 1450    Sharyn Creamer, MD 10/07/21 [phone]

## 2021-10-04 NOTE — Discharge Instructions (Signed)
Follow up with your regular doctor if not improving.  Return emergency department worsening.  Take over-the-counter cold medications.  Drink plenty of fluids.  If you are positive for COVID you will need to quarantine for 1 week.  If positive for influenza you will need to stay home until you have not had a fever for 24 hours.

## 2021-10-05 ENCOUNTER — Telehealth: Payer: Self-pay

## 2021-10-05 NOTE — Telephone Encounter (Signed)
Transition Care Management Unsuccessful Follow-up Telephone Call  Date of discharge and from where:  10/04/2021-[facility]   Attempts:  1st Attempt  Reason for unsuccessful TCM follow-up call:  Left voice message

## 2021-10-06 NOTE — Telephone Encounter (Signed)
Transition Care Management Follow-up Telephone Call Date of discharge and from where: 10/04/2021 from [facility] How have you been since you were released from the hospital? Pt stated that she is feeling better and did not questions or concerns at this time.  Any questions or concerns? No  Items Reviewed: Did the pt receive and understand the discharge instructions provided? Yes  Medications obtained and verified? Yes  Other? No  Any new allergies since your discharge? No  Dietary orders reviewed? No Do you have support at home? Yes   Functional Questionnaire: (I = Independent and D = Dependent) ADLs: I  Bathing/Dressing- I  Meal Prep- I  Eating- I  Maintaining continence- I  Transferring/Ambulation- I  Managing Meds- I   Follow up appointments reviewed:  PCP Hospital f/u appt confirmed? No  Confirmed PCP is with [facility].  Specialist Hospital f/u appt confirmed? No   Are transportation arrangements needed? No  If their condition worsens, is the pt aware to call PCP or go to the Emergency Dept.? Yes Was the patient provided with contact information for the PCP's office or ED? Yes Was to pt encouraged to call back with questions or concerns? Yes

## 2022-01-01 ENCOUNTER — Emergency Department
Admission: EM | Admit: 2022-01-01 | Discharge: 2022-01-01 | Disposition: A | Discharge status: Home / Self Care | Payer: Medicaid Other | Attending: Emergency Medicine | Admitting: Emergency Medicine

## 2022-01-01 ENCOUNTER — Other Ambulatory Visit: Payer: Self-pay

## 2022-01-01 DIAGNOSIS — R1032 Left lower quadrant pain: Secondary | ICD-10-CM | POA: Insufficient documentation

## 2022-01-01 DIAGNOSIS — R110 Nausea: Secondary | ICD-10-CM | POA: Diagnosis not present

## 2022-01-01 DIAGNOSIS — R1013 Epigastric pain: Secondary | ICD-10-CM | POA: Insufficient documentation

## 2022-01-01 DIAGNOSIS — I498 Other specified cardiac arrhythmias: Secondary | ICD-10-CM | POA: Insufficient documentation

## 2022-01-01 LAB — COMPREHENSIVE METABOLIC PANEL
ALT: 16 U/L (ref 0–44)
AST: 21 U/L (ref 15–41)
Albumin: 4.1 g/dL (ref 3.5–5.0)
Alkaline Phosphatase: 62 U/L (ref 38–126)
Anion gap: 9 (ref 5–15)
BUN: 9 mg/dL (ref 6–20)
CO2: 26 mmol/L (ref 22–32)
Calcium: 9.1 mg/dL (ref 8.9–10.3)
Chloride: 103 mmol/L (ref 98–111)
Creatinine, Ser: 0.82 mg/dL (ref 0.44–1.00)
GFR, Estimated: >60 mL/min (ref >60)
Glucose, Bld: 108 mg/dL — ABNORMAL HIGH (ref 70–99)
Potassium: 3.8 mmol/L (ref 3.5–5.1)
Sodium: 138 mmol/L (ref 135–145)
Total Bilirubin: 0.5 mg/dL (ref 0.3–1.2)
Total Protein: 7.4 g/dL (ref 6.5–8.1)

## 2022-01-01 LAB — URINALYSIS, ROUTINE W REFLEX MICROSCOPIC
Bilirubin Urine: NEGATIVE
Glucose, UA: NEGATIVE mg/dL
Hgb urine dipstick: NEGATIVE
Ketones, ur: NEGATIVE mg/dL
Leukocytes,Ua: NEGATIVE
Nitrite: NEGATIVE
Protein, ur: NEGATIVE mg/dL
Specific Gravity, Urine: 1.014 (ref 1.005–1.030)
pH: 6 (ref 5.0–8.0)

## 2022-01-01 LAB — CBC
HCT: 41.8 % (ref 36.0–46.0)
Hemoglobin: 13.9 g/dL (ref 12.0–15.0)
MCH: 28.8 pg (ref 26.0–34.0)
MCHC: 33.3 g/dL (ref 30.0–36.0)
MCV: 86.5 fL (ref 80.0–100.0)
Platelets: 161 10*3/uL (ref 150–400)
RBC: 4.83 MIL/uL (ref 3.87–5.11)
RDW: 11.9 % (ref 11.5–15.5)
WBC: 6.2 10*3/uL (ref 4.0–10.5)
nRBC: 0 % (ref 0.0–0.2)

## 2022-01-01 LAB — LIPASE, BLOOD: Lipase: 28 U/L (ref 11–51)

## 2022-01-01 LAB — POC URINE PREG, ED: Preg Test, Ur: NEGATIVE

## 2022-01-01 MED ORDER — ONDANSETRON 4 MG PO TBDP: 4.0000 mg | ORAL_TABLET | Freq: Three times a day (TID) | ORAL | 0 refills | Status: DC | PRN

## 2022-01-01 NOTE — ED Provider Notes (Addendum)
? ?[facility] ?Provider Note ? ? Event Date/Time  ? First MD Initiated Contact with Patient 01/01/22 1453   ?  (approximate) ?History  ?Abdominal Pain ? ?HPI ?April Tran is a 31 y.o. female with no stated past medical history presents for left lower quadrant abdominal pain over the past week as well and has epigastric pain that started just prior to arrival today.  Patient describes both as 6/10 in severity and nonradiating.  Patient denies any associated vomiting/diarrhea but does endorse some nausea and p.o. aversion.  Patient states that the left lower quadrant pain is worse with any movement or palpation over this area.  Patient states that the epigastric pain is somewhat worse with eating and denies any relieving factors for either pain.  Patient currently denies any vision changes, tinnitus, difficulty speaking, facial droop, sore throat, chest pain, shortness of breath, dysuria, or weakness/numbness/paresthesias in any extremity ?Physical Exam  ?Triage Vital Signs: ?ED Triage Vitals  ?Enc Vitals Group  ?   BP 01/01/22 1223 (!) 129/94  ?   Pulse Rate 01/01/22 1223 82  ?   Resp 01/01/22 1223 18  ?   Temp 01/01/22 1221 98.2 ?F (36.8 ?C)  ?   Temp Source 01/01/22 1221 Oral  ?   SpO2 01/01/22 1223 96 %  ?   Weight --   ?   Height 01/01/22 1221 5\' 6"  (1.676 m)  ?   Head Circumference --   ?   Peak Flow --   ?   Pain Score 01/01/22 1221 6  ?   Pain Loc --   ?   Pain Edu? --   ?   Excl. in GC? --   ? ?Most recent vital signs: ?Vitals:  ? 01/01/22 1223 01/01/22 1439  ?BP: (!) 129/94   ?Pulse: 82 71  ?Resp: 18   ?Temp:    ?SpO2: 96% 98%  ? ?General: Awake, oriented x4. ?CV:  Good peripheral perfusion.  ?Resp:  Normal effort.  ?Abd:  No distention.  Tenderness to deep palpation over the left inguinal region without abdominal wall defect appreciated ?Other:  Middle-aged Caucasian overweight female laying in bed in no distress ?ED Results / Procedures / Treatments  ?Labs ?(all labs ordered  are listed, but only abnormal results are displayed) ?Labs Reviewed  ?COMPREHENSIVE METABOLIC PANEL - Abnormal; Notable for the following components:  ?    Result Value  ? Glucose, Bld 108 (*)   ? All other components within normal limits  ?URINALYSIS, ROUTINE W REFLEX MICROSCOPIC - Abnormal; Notable for the following components:  ? Color, Urine YELLOW (*)   ? APPearance CLEAR (*)   ? All other components within normal limits  ?LIPASE, BLOOD  ?CBC  ?POC URINE PREG, ED  ? ?PROCEDURES: ?Critical Care performed: No ?.1-3 Lead EKG Interpretation ?Performed by: 01/03/22, MD ?Authorized by: Merwyn Katos, MD  ? ?  Interpretation: normal   ?  ECG rate:  72 ?  ECG rate assessment: normal   ?  Rhythm: sinus rhythm   ?  Ectopy: none   ?  Conduction: normal   ?MEDICATIONS ORDERED IN ED: ?Medications - No data to display ?IMPRESSION / MDM / ASSESSMENT AND PLAN / ED COURSE  ?I reviewed the triage vital signs and the nursing notes. ?             ?               ?The patient  is on the cardiac monitor to evaluate for evidence of arrhythmia and/or significant heart rate changes. ?Patient?s symptoms not typical for emergent causes of abdominal pain such as, but not limited to, appendicitis, abdominal aortic aneurysm, surgical biliary disease, pancreatitis, SBO, mesenteric ischemia, serious intra-abdominal bacterial illness. ?Presentation also not typical of gynecologic emergencies such as TOA, Ovarian Torsion, PID. Not Ectopic. ?Doubt atypical ACS. ?Laboratory evaluation negative for any red flags specifically: CBC/CMP/UA/lipase shows no evidence of acute abnormalities.  POC pregnancy negative ?Pt tolerating PO. ?Disposition: Patient will be discharged with strict return precautions and follow up with primary MD within 12-24 hours for further evaluation. ?Patient understands that this still may have an early presentation of an emergent medical condition such as appendicitis that will require a recheck. ? ?  ?FINAL CLINICAL  IMPRESSION(S) / ED DIAGNOSES  ? ?Final diagnoses:  ?Left lower quadrant abdominal pain  ?Epigastric pain  ? ?Rx / DC Orders  ? ?ED Discharge Orders   ? ?      Ordered  ?  ondansetron (ZOFRAN-ODT) 4 MG disintegrating tablet  Every 8 hours PRN       ? 01/01/22 1524  ? ?  ?  ? ?  ? ?Note:  This document was prepared using Dragon voice recognition software and may include unintentional dictation errors. ?  ?Merwyn Katos, MD ?01/01/22 1540 ? ?  ?Merwyn Katos, MD ?01/01/22 1540 ? ?

## 2022-01-01 NOTE — ED Notes (Addendum)
Pt c/o medial to L abdominal pain and L flank pain; pt describes it as "pressure, pinching, and being punched in the stomach". Denies changes to urination. Reports family history of kidney stones; chronic back pain but no new back pain; reports gallbladder removed about a year ago. Denies recent fever. Pt's resp reg/unlabored, skin dry, sitting calmly on stretcher.  ?

## 2022-01-01 NOTE — ED Triage Notes (Signed)
Patient to ER via Pov with complaints of LLQ pain x 1 week. Reports waking up this morning and the pain had worsened, reports sharp stabbing epigastric pain. States initially she thought it was gas but takes gas-ex regularly. Last BM yesterday. Denies urinary symptoms.  ? ?Reports having her gallbladder removed a year ago. ?

## 2022-01-06 ENCOUNTER — Other Ambulatory Visit: Payer: Self-pay

## 2022-01-06 ENCOUNTER — Encounter: Payer: Self-pay | Admitting: Emergency Medicine

## 2022-01-06 ENCOUNTER — Emergency Department: Payer: Medicaid Other

## 2022-01-06 ENCOUNTER — Emergency Department
Admission: EM | Admit: 2022-01-06 | Discharge: 2022-01-06 | Disposition: A | Discharge status: Home / Self Care | Payer: Medicaid Other | Attending: Student in an Organized Health Care Education/Training Program | Admitting: Student in an Organized Health Care Education/Training Program

## 2022-01-06 DIAGNOSIS — R102 Pelvic and perineal pain: Secondary | ICD-10-CM | POA: Insufficient documentation

## 2022-01-06 DIAGNOSIS — M25552 Pain in left hip: Secondary | ICD-10-CM | POA: Diagnosis not present

## 2022-01-06 NOTE — ED Provider Notes (Signed)
? ?[facility] ?Provider Note ? ? ? Event Date/Time  ? First MD Initiated Contact with Patient 01/06/22 [phone]   ?  (approximate) ? ? ?History  ? ?Hip Pain and Pelvic Pain ? ? ?HPI ? ?April Tran is a 31 y.o. female   with a history of IBS reflux presents to the ER for evaluation of 10 days of left hip pain causing intermittent popping sensation and pain particular with standing and being on her feet for quite some time.  Denies any numbness or tingling.  Denies any abdominal pain no dysuria no vaginal discharge no vaginal bleeding no diarrhea or constipation.  Denies any flank pain no fevers or chills.  Denies any specific injury that she recalls. ? ?  ? ? ?Physical Exam  ? ?Triage Vital Signs: ?ED Triage Vitals  ?Enc Vitals Group  ?   BP 01/06/22 0734 (!) 139/93  ?   Pulse Rate 01/06/22 0734 78  ?   Resp 01/06/22 0734 19  ?   Temp 01/06/22 0734 97.8 ?F (36.6 ?C)  ?   Temp src --   ?   SpO2 01/06/22 0734 99 %  ?   Weight 01/06/22 0730 242 lb (109.8 kg)  ?   Height 01/06/22 0730 5\' 6"  (1.676 m)  ?   Head Circumference --   ?   Peak Flow --   ?   Pain Score 01/06/22 0729 7  ?   Pain Loc --   ?   Pain Edu? --   ?   Excl. in GC? --   ? ? ?Most recent vital signs: ?Vitals:  ? 01/06/22 0734  ?BP: (!) 139/93  ?Pulse: 78  ?Resp: 19  ?Temp: 97.8 ?F (36.6 ?C)  ?SpO2: 99%  ? ? ? ?Constitutional: Alert  ?Eyes: Conjunctivae are normal.  ?Head: Atraumatic. ?Nose: No congestion/rhinnorhea. ?Mouth/Throat: Mucous membranes are moist.   ?Neck: Painless ROM.  ?Cardiovascular:   Good peripheral circulation. ?Respiratory: Normal respiratory effort.  No retractions.  ?Gastrointestinal: Soft and nontender.  ?Musculoskeletal:  no deformity.  Pain and discomfort with external rotation of the left hip no clicking or crepitus.  Neurovascular intact distally.  No overlying warmth or erythema ?Neurologic:  MAE spontaneously. No gross focal neurologic deficits are appreciated.  ?Skin:  Skin is warm, dry and intact. No  rash noted. ?Psychiatric: Mood and affect are normal. Speech and behavior are normal. ? ? ? ?ED Results / Procedures / Treatments  ? ?Labs ?(all labs ordered are listed, but only abnormal results are displayed) ?Labs Reviewed - No data to display ? ? ?EKG ? ? ? ? ?RADIOLOGY ?Please see ED Course for my review and interpretation. ? ?I personally reviewed all radiographic images ordered to evaluate for the above acute complaints and reviewed radiology reports and findings.  These findings were personally discussed with the patient.  Please see medical record for radiology report. ? ? ? ?PROCEDURES: ? ?Critical Care performed: No ? ?Procedures ? ? ?MEDICATIONS ORDERED IN ED: ?Medications - No data to display ? ? ?IMPRESSION / MDM / ASSESSMENT AND PLAN / ED COURSE  ?I reviewed the triage vital signs and the nursing notes. ?             ?               ? ?Differential diagnosis includes, but is not limited to, strain, contusion, dislocation, tendinitis, bursitis, arthritis ? ?Patient presented to the ER for evaluation of symptoms as  described above.  She is clinically well-appearing exam as above.  Does not seem consistent with infectious process.  Her abdominal exam is benign do not feel that imaging of the abdomen is clinically indicated.  Seems more musculoskeletal.  Will order x-ray.  Neurovascular intact on exam. ? ? ?Clinical Course as of 01/06/22 0832  ?Wed Jan 06, 2022  ?0818 Hip x-ray on my review does not show any evidence of fracture or dislocation.  She is able to bear weight and no acute distress.  Does appear stable and appropriate for further work-up as an outpatient. [PR]  ?  ?Clinical Course User Index ?[PR] Willy Eddy, MD  ? ? ? ?FINAL CLINICAL IMPRESSION(S) / ED DIAGNOSES  ? ?Final diagnoses:  ?Left hip pain  ? ? ? ?Rx / DC Orders  ? ?ED Discharge Orders   ? ? None  ? ?  ? ? ? ?Note:  This document was prepared using Dragon voice recognition software and may include unintentional dictation  errors. ? ?  ?Willy Eddy, MD ?01/06/22 [phone] ? ?

## 2022-01-06 NOTE — ED Triage Notes (Signed)
C/O left pelvic / left side pain x 10 days.Seen for same 01/01/2022.  States pain unchanged.  When seen on 01/01/2022 also c/o epigastric pain, which has resolved with reflux meds. ? ?AAOx3.  Skin warm and dry. NAD ?

## 2022-01-06 NOTE — ED Notes (Signed)
31 yof with a c/c of left sided hip pain for 10 days. The pt advised that she can hear her left hip "pop" upon movement. The pt denies any injury.  ?

## 2022-02-01 ENCOUNTER — Emergency Department
Admission: EM | Admit: 2022-02-01 | Discharge: 2022-02-01 | Disposition: A | Discharge status: Home / Self Care | Payer: Medicaid Other | Attending: Emergency Medicine | Admitting: Emergency Medicine

## 2022-02-01 ENCOUNTER — Other Ambulatory Visit: Payer: Self-pay

## 2022-02-01 DIAGNOSIS — Z5321 Procedure and treatment not carried out due to patient leaving prior to being seen by health care provider: Secondary | ICD-10-CM | POA: Diagnosis not present

## 2022-02-01 DIAGNOSIS — R109 Unspecified abdominal pain: Secondary | ICD-10-CM | POA: Diagnosis present

## 2022-02-01 DIAGNOSIS — R110 Nausea: Secondary | ICD-10-CM | POA: Insufficient documentation

## 2022-02-01 LAB — CBC
HCT: 41.7 % (ref 36.0–46.0)
Hemoglobin: 14.1 g/dL (ref 12.0–15.0)
MCH: 29 pg (ref 26.0–34.0)
MCHC: 33.8 g/dL (ref 30.0–36.0)
MCV: 85.8 fL (ref 80.0–100.0)
Platelets: 172 10*3/uL (ref 150–400)
RBC: 4.86 MIL/uL (ref 3.87–5.11)
RDW: 12.2 % (ref 11.5–15.5)
WBC: 6.6 10*3/uL (ref 4.0–10.5)
nRBC: 0 % (ref 0.0–0.2)

## 2022-02-01 LAB — COMPREHENSIVE METABOLIC PANEL
ALT: 18 U/L (ref 0–44)
AST: 20 U/L (ref 15–41)
Albumin: 4.2 g/dL (ref 3.5–5.0)
Alkaline Phosphatase: 69 U/L (ref 38–126)
Anion gap: 9 (ref 5–15)
BUN: 12 mg/dL (ref 6–20)
CO2: 24 mmol/L (ref 22–32)
Calcium: 9.3 mg/dL (ref 8.9–10.3)
Chloride: 104 mmol/L (ref 98–111)
Creatinine, Ser: 0.84 mg/dL (ref 0.44–1.00)
GFR, Estimated: >60 mL/min (ref >60)
Glucose, Bld: 93 mg/dL (ref 70–99)
Potassium: 3.7 mmol/L (ref 3.5–5.1)
Sodium: 137 mmol/L (ref 135–145)
Total Bilirubin: 0.7 mg/dL (ref 0.3–1.2)
Total Protein: 7.8 g/dL (ref 6.5–8.1)

## 2022-02-01 LAB — URINALYSIS, ROUTINE W REFLEX MICROSCOPIC
Bilirubin Urine: NEGATIVE
Glucose, UA: NEGATIVE mg/dL
Hgb urine dipstick: NEGATIVE
Ketones, ur: NEGATIVE mg/dL
Nitrite: NEGATIVE
Protein, ur: NEGATIVE mg/dL
Specific Gravity, Urine: 1.012 (ref 1.005–1.030)
pH: 8 (ref 5.0–8.0)

## 2022-02-01 LAB — LIPASE, BLOOD: Lipase: 30 U/L (ref 11–51)

## 2022-02-01 LAB — POC URINE PREG, ED: Preg Test, Ur: NEGATIVE

## 2022-02-01 NOTE — ED Provider Triage Note (Signed)
Emergency Medicine Provider Triage Evaluation Note  Lynsee L [place] , a 31 y.o. female  was evaluated in triage.  Pt complains of abdominal pain  Review of Systems  Positive:  Negative:   Physical Exam  BP 138/85   Pulse 91   Temp 98.4 F (36.9 C) (Oral)   Resp 18   Ht 5\' 6"  (1.676 m)   Wt 109 kg   SpO2 95%   BMI 38.79 kg/m  Gen:   Awake, no distress   Resp:  Normal effort  MSK:   Moves extremities without difficulty  Other:    Medical Decision Making  Medically screening exam initiated at 3:07 PM.  Appropriate orders placed.  Rebekkah L was informed that the remainder of the evaluation will be completed by another provider, this initial triage assessment does not replace that evaluation, and the importance of remaining in the ED until their evaluation is complete.  Pain protocols started   [place], PA-C 02/01/22 1508

## 2022-02-01 NOTE — ED Triage Notes (Signed)
To ED via POV c/o "pain to belly button". Reports paid as sharp pain that is intermittent, but has been constant today. +nausea.

## 2022-02-02 ENCOUNTER — Other Ambulatory Visit: Payer: Self-pay

## 2022-02-02 ENCOUNTER — Encounter: Payer: Self-pay | Admitting: Emergency Medicine

## 2022-02-02 ENCOUNTER — Emergency Department: Payer: Medicaid Other

## 2022-02-02 ENCOUNTER — Emergency Department
Admission: EM | Admit: 2022-02-02 | Discharge: 2022-02-02 | Disposition: A | Discharge status: Home / Self Care | Payer: Medicaid Other | Attending: Emergency Medicine | Admitting: Emergency Medicine

## 2022-02-02 DIAGNOSIS — R109 Unspecified abdominal pain: Secondary | ICD-10-CM | POA: Insufficient documentation

## 2022-02-02 DIAGNOSIS — N200 Calculus of kidney: Secondary | ICD-10-CM | POA: Diagnosis not present

## 2022-02-02 DIAGNOSIS — N12 Tubulo-interstitial nephritis, not specified as acute or chronic: Secondary | ICD-10-CM

## 2022-02-02 DIAGNOSIS — N10 Acute tubulo-interstitial nephritis: Secondary | ICD-10-CM | POA: Insufficient documentation

## 2022-02-02 DIAGNOSIS — R110 Nausea: Secondary | ICD-10-CM | POA: Insufficient documentation

## 2022-02-02 DIAGNOSIS — K439 Ventral hernia without obstruction or gangrene: Secondary | ICD-10-CM | POA: Diagnosis not present

## 2022-02-02 DIAGNOSIS — N1330 Unspecified hydronephrosis: Secondary | ICD-10-CM | POA: Diagnosis not present

## 2022-02-02 DIAGNOSIS — K429 Umbilical hernia without obstruction or gangrene: Secondary | ICD-10-CM | POA: Diagnosis not present

## 2022-02-02 MED ORDER — ONDANSETRON 4 MG PO TBDP
4.0000 mg | ORAL_TABLET | Freq: Once | ORAL | Status: AC
  Administered 2022-02-02: 4 mg via ORAL
  Filled 2022-02-02: qty 1

## 2022-02-02 MED ORDER — SODIUM CHLORIDE 0.9 % IV SOLN
1.0000 g | Freq: Once | INTRAVENOUS | Status: DC
  Filled 2022-02-02: qty 10

## 2022-02-02 MED ORDER — DICYCLOMINE HCL 10 MG PO CAPS
10.0000 mg | ORAL_CAPSULE | Freq: Once | ORAL | Status: AC
Start: 2022-02-02 — End: 2022-02-02
  Administered 2022-02-02: 10 mg via ORAL
  Filled 2022-02-02: qty 1

## 2022-02-02 MED ORDER — ONDANSETRON 4 MG PO TBDP: 4.0000 mg | ORAL_TABLET | Freq: Three times a day (TID) | ORAL | 0 refills | Status: DC | PRN

## 2022-02-02 MED ORDER — METOCLOPRAMIDE HCL 10 MG PO TABS
10.0000 mg | ORAL_TABLET | Freq: Once | ORAL | Status: AC
  Administered 2022-02-02: 10 mg via ORAL
  Filled 2022-02-02: qty 1

## 2022-02-02 MED ORDER — CEFDINIR 300 MG PO CAPS: 300.0000 mg | ORAL_CAPSULE | Freq: Two times a day (BID) | ORAL | 0 refills | Status: DC

## 2022-02-02 MED ORDER — SODIUM CHLORIDE 0.9 % IV BOLUS: 1000.0000 mL | Freq: Once | INTRAVENOUS | Status: DC

## 2022-02-02 MED ORDER — CEFTRIAXONE SODIUM 1 G IJ SOLR
1.0000 g | Freq: Once | INTRAMUSCULAR | Status: AC
Start: 2022-02-02 — End: 2022-02-02
  Administered 2022-02-02: 1 g via INTRAMUSCULAR
  Filled 2022-02-02: qty 10

## 2022-02-02 MED ORDER — LIDOCAINE HCL (PF) 1 % IJ SOLN
5.0000 mL | Freq: Once | INTRAMUSCULAR | Status: AC
  Administered 2022-02-02: 2.1 mL via INTRADERMAL
  Filled 2022-02-02: qty 5

## 2022-02-02 NOTE — ED Notes (Signed)
See triage note  presents with abd pain  states pain started yesterday  no fever   states pain became worse yesterday afternoon   but left before being seen

## 2022-02-02 NOTE — ED Notes (Signed)
Pt had labs and urine collected yesterday.

## 2022-02-02 NOTE — ED Provider Notes (Signed)
[facility] Provider Note    Event Date/Time   First MD Initiated Contact with Patient 02/02/22 [phone]     (approximate)   History   Abdominal Pain   HPI  April Tran is a 31 y.o. female with a history of depression presents emergency department with nausea and abdominal pain.  Came to the ED last night but left without being seen.  Patient's labs were completed last night.  Continues to have pain and nausea in the mid abdomen.  No diarrhea.  Patient does not have a gallbladder.  States she does still have her appendix.      Physical Exam   Triage Vital Signs: ED Triage Vitals [02/02/22 0921]  Enc Vitals Group     BP 126/86     Pulse Rate 79     Resp 18     Temp 98.1 F (36.7 C)     Temp Source Oral     SpO2 99 %     Weight 240 lb 4.8 oz (109 kg)     Height 5\' 6"  (1.676 m)     Head Circumference      Peak Flow      Pain Score 7     Pain Loc      Pain Edu?      Excl. in [place]?     Most recent vital signs: Vitals:   02/02/22 0921  BP: 126/86  Pulse: 79  Resp: 18  Temp: 98.1 F (36.7 C)  SpO2: 99%     General: Awake, no distress.   CV:  Good peripheral perfusion. regular rate and  rhythm Resp:  Normal effort.  Abd:  No distention.  Tender near the umbilicus and left flank, no McBurney's point tenderness Other:      ED Results / Procedures / Treatments   Labs (all labs ordered are listed, but only abnormal results are displayed) Labs Reviewed - No data to display   EKG     RADIOLOGY CT renal stone    PROCEDURES:   Procedures   MEDICATIONS ORDERED IN ED: Medications  ondansetron (ZOFRAN-ODT) disintegrating tablet 4 mg (4 mg Oral Given 02/02/22 1007)  dicyclomine (BENTYL) capsule 10 mg (10 mg Oral Given 02/02/22 1221)  cefTRIAXone (ROCEPHIN) injection 1 g (1 g Intramuscular Given 02/02/22 1220)  lidocaine (PF) (XYLOCAINE) 1 % injection 5 mL (2.1 mLs Intradermal Given 02/02/22 1220)  metoCLOPramide (REGLAN)  tablet 10 mg (10 mg Oral Given 02/02/22 1221)     IMPRESSION / MDM / ASSESSMENT AND PLAN / ED COURSE  I reviewed the triage vital signs and the nursing notes.                              Differential diagnosis includes, but is not limited to, kidney stone, infected kidney stone, pyelonephritis, UTI, diverticulitis, bowel obstruction  I did review the patient's labs from yesterday, her labs are reassuring, comprehensive metabolic panel, CBC, POC pregnancy were all normal.  Her urinalysis did show concerns for infection with large amount of hemoglobin and hazy appearance.  Due to the continued abdominal pain I did a CT renal stone to as the patient may have pyelonephritis or infected kidney stone.  I did review the CT and the radiologist read.  I interpret this as being negative for kidney stone at this time.  Most likely UTI/pyelonephritis.  We will give the patient Rocephin 1 g IV along with  normal saline 1 L IV.  Bentyl for abdominal pain.  Patient is in agreement treatment plan.  Plan at this time is to discharge with oral antibiotic.  Return if worsening.         FINAL CLINICAL IMPRESSION(S) / ED DIAGNOSES   Final diagnoses:  Nausea  Pyelonephritis     Rx / DC Orders   ED Discharge Orders          Ordered    cefdinir (OMNICEF) 300 MG capsule  2 times daily        02/02/22 1211    ondansetron (ZOFRAN-ODT) 4 MG disintegrating tablet  Every 8 hours PRN        02/02/22 1211             Note:  This document was prepared using Dragon voice recognition software and may include unintentional dictation errors.    Versie Starks, PA-C 02/02/22 1541    Blake Divine, MD 02/03/22 [phone]

## 2022-02-02 NOTE — ED Triage Notes (Signed)
Pt here with abd pain that started yesterday. Pt came to the ED to be seen but states she left early due to the wait. Pt continuing to have pain and nausea.

## 2022-02-02 NOTE — Discharge Instructions (Addendum)
Follow-up with your regular doctor for improving 2 days.  Return emergency department worsening.  Use medications as prescribed.

## 2022-02-03 ENCOUNTER — Telehealth: Payer: Self-pay

## 2022-02-03 NOTE — Patient Outreach (Signed)
Care Coordination  02/03/2022  Kilee L [place] 11-Aug-1991 [id number]  Transition Care Management Unsuccessful Follow-up Telephone Call  Date of discharge and from where:  02/02/22 [facility]   Attempts:  1st Attempt  Reason for unsuccessful TCM follow-up call:  Left voice message

## 2022-02-04 ENCOUNTER — Telehealth: Payer: Self-pay

## 2022-02-04 NOTE — Patient Outreach (Signed)
Care Coordination  02/04/2022  April Tran June 27, 1991 [id number]  Transition Care Management Unsuccessful Follow-up Telephone Call  Date of discharge and from where:  02/02/22 [facility]   Attempts:  2nd Attempt  Reason for unsuccessful TCM follow-up call:  Left voice message

## 2022-04-18 ENCOUNTER — Emergency Department
Admission: EM | Admit: 2022-04-18 | Discharge: 2022-04-18 | Disposition: A | Discharge status: Home / Self Care | Payer: Medicaid Other | Attending: Emergency Medicine | Admitting: Emergency Medicine

## 2022-04-18 ENCOUNTER — Other Ambulatory Visit: Payer: Self-pay

## 2022-04-18 ENCOUNTER — Encounter: Payer: Self-pay | Admitting: Emergency Medicine

## 2022-04-18 ENCOUNTER — Emergency Department: Payer: Medicaid Other

## 2022-04-18 DIAGNOSIS — S6991XA Unspecified injury of right wrist, hand and finger(s), initial encounter: Secondary | ICD-10-CM | POA: Diagnosis not present

## 2022-04-18 DIAGNOSIS — Y990 Civilian activity done for income or pay: Secondary | ICD-10-CM | POA: Insufficient documentation

## 2022-04-18 DIAGNOSIS — S66911A Strain of unspecified muscle, fascia and tendon at wrist and hand level, right hand, initial encounter: Secondary | ICD-10-CM | POA: Insufficient documentation

## 2022-04-18 DIAGNOSIS — X509XXA Other and unspecified overexertion or strenuous movements or postures, initial encounter: Secondary | ICD-10-CM | POA: Insufficient documentation

## 2022-04-18 NOTE — ED Triage Notes (Signed)
Pt reports hurt her hand 2 weeks ago at work, was seen at [place] [facility] and they told her it was a sprain. Pt reports since then the pain has become worse and she is not able to use her hand to write or do anything and wants it re-evaluated. Pt reports never had x-rays done

## 2022-04-18 NOTE — Discharge Instructions (Addendum)
I suspect he may have strained one of the tendons in your right hand.  I do recommend you utilize a right hand splint which help supports you are fingers for the next few days.  Please schedule follow-up with our hand specialist Dr. Stephenie Acres within the next week.  Return to the ER right away if you feel you are developing infection, cannot use the right hand, have severe pain, fingers feel cold or blue, redness appears, or other concerns arise

## 2022-04-18 NOTE — ED Provider Notes (Signed)
[facility] Provider Note    Event Date/Time   First MD Initiated Contact with Patient 04/18/22 1718     (approximate)   History   Hand Pain   HPI  April Tran is a 31 y.o. female history of previous cholecystectomy  Patient reports about 2 years ago she injured her right hand when she fell on a cinder block, but it healed well to never have to have x-rays.  She was at work 2 weeks ago and she was helping to move a large paper roll when she suddenly felt pain over the palm of her hand and noticed difficulty closing the hand.  This pain is persisted makes it hard for her to write hold a pen or use the right hand.  No numbness tingling or weakness but she notices especially when she tries to close the right hand her right index finger hurts and when she does so it shoots pain across the palm of the hand.  The right hand is just slightly swollen.  No fevers no redness.  Not wearing any rings on the hand.  Pain is okay when sitting still but when she goes to close the fingers especially the index finger of the right hand it causes pain     Physical Exam   Triage Vital Signs: ED Triage Vitals  Enc Vitals Group     BP 04/18/22 1702 120/82     Pulse Rate 04/18/22 1702 85     Resp 04/18/22 1702 14     Temp 04/18/22 1702 98.2 F (36.8 C)     Temp Source 04/18/22 1702 Oral     SpO2 04/18/22 1702 98 %     Weight 04/18/22 1658 242 lb (109.8 kg)     Height 04/18/22 1658 5\' 6"  (1.676 m)     Head Circumference --      Peak Flow --      Pain Score 04/18/22 1658 5     Pain Loc --      Pain Edu? --      Excl. in [place]? --     Most recent vital signs: Vitals:   04/18/22 1702  BP: 120/82  Pulse: 85  Resp: 14  Temp: 98.2 F (36.8 C)  SpO2: 98%     General: Awake, no distress.  CV:  Good peripheral perfusion.  Resp:  Normal effort.  Abd:  No distention. Other:   Very pleasant.  No distress  RIGHT Right upper extremity demonstrates normal  strength, good use of all muscles Ceptaz noted related to the right hand. No edema bruising or contusions of the right shoulder/upper arm, right elbow, right forearm / hand. Full range of motion of the right right upper extremity without pain except in the right hand. No evidence of trauma. Strong radial pulse. Intact median/ulnar/radial neuro-muscular exam.  Patient does relate some pain over the flexor tendon sheath on the palmar aspect of the hand, and pain is elicited when she attempts to flex the index finger also slight with the right thumb and right third digit elicits pain across the palmar aspect of the hand.  There is no erythema, there is no circumferential swelling of the fingers, there is no evidence of infection or open wound.  There does not appear to be any findings that would be suggestive of acute tenosynovitis.  The right hand appears atraumatic though there is still a slight amount of swelling noted over the palmar aspect of the hand and  slight around the base of the first second and third digit but again no erythema no redness.    ED Results / Procedures / Treatments   Labs (all labs ordered are listed, but only abnormal results are displayed) Labs Reviewed - No data to display   EKG     RADIOLOGY  Clinical Course as of 04/18/22 1753  Sun Apr 18, 2022  1740 Plain films of the right hand interpreted by me as negative for acute bony abnormality [MQ]    Clinical Course User Index [MQ] Sharyn Creamer, MD      PROCEDURES:  Critical Care performed: No  Procedures   MEDICATIONS ORDERED IN ED: Medications - No data to display   IMPRESSION / MDM / ASSESSMENT AND PLAN / ED COURSE  I reviewed the triage vital signs and the nursing notes.                              Differential diagnosis includes, but is not limited to, possible hand injury, blunt trauma to the hand, fracture or dislocation seem unlikely given the mechanism, no evidence of infection, no evidence  of acute infectious tenosynovitis, but she does have some pain through range of motion along the index finger and flexor tendon sheath.  Suspect she may have a strain of the flexor tendons of the right hand at this point.  Discussed with patient, recommended that she may trial commercial brace which she can purchase at the pharmacy, ibuprofen as needed for pain relief, and I recommended that she follow-up with orthopedic hand specialist such as Dr. Stephenie Acres at [facility] in about a week's time which she is agreeable with.   Return precautions including signs and symptoms of infection discussed with the patient is in agreement with the plan.  FINAL CLINICAL IMPRESSION(S) / ED DIAGNOSES   Final diagnoses:  Hand strain, right, initial encounter     Rx / DC Orders   ED Discharge Orders          Ordered    AMB referral to orthopedics       Comments: Right hand injury. Concern for flexor tendon injury.   04/18/22 1731             Note:  This document was prepared using Dragon voice recognition software and may include unintentional dictation errors.   Sharyn Creamer, MD 04/18/22 [phone]

## 2022-07-02 DIAGNOSIS — S6991XA Unspecified injury of right wrist, hand and finger(s), initial encounter: Secondary | ICD-10-CM | POA: Diagnosis not present

## 2022-07-02 DIAGNOSIS — S63501A Unspecified sprain of right wrist, initial encounter: Secondary | ICD-10-CM | POA: Diagnosis not present

## 2022-07-02 DIAGNOSIS — M25531 Pain in right wrist: Secondary | ICD-10-CM | POA: Diagnosis not present

## 2022-08-04 DIAGNOSIS — S6991XA Unspecified injury of right wrist, hand and finger(s), initial encounter: Secondary | ICD-10-CM | POA: Diagnosis not present

## 2022-08-18 DIAGNOSIS — S6991XA Unspecified injury of right wrist, hand and finger(s), initial encounter: Secondary | ICD-10-CM | POA: Diagnosis not present

## 2022-11-08 ENCOUNTER — Emergency Department: Payer: Medicaid Other

## 2022-11-08 ENCOUNTER — Encounter: Payer: Self-pay | Admitting: Emergency Medicine

## 2022-11-08 ENCOUNTER — Other Ambulatory Visit: Payer: Self-pay

## 2022-11-08 ENCOUNTER — Emergency Department
Admission: EM | Admit: 2022-11-08 | Discharge: 2022-11-08 | Disposition: A | Discharge status: Home / Self Care | Payer: Medicaid Other | Attending: Emergency Medicine | Admitting: Emergency Medicine

## 2022-11-08 DIAGNOSIS — R0602 Shortness of breath: Secondary | ICD-10-CM | POA: Diagnosis not present

## 2022-11-08 DIAGNOSIS — Z20822 Contact with and (suspected) exposure to covid-19: Secondary | ICD-10-CM | POA: Insufficient documentation

## 2022-11-08 DIAGNOSIS — R519 Headache, unspecified: Secondary | ICD-10-CM | POA: Diagnosis not present

## 2022-11-08 DIAGNOSIS — R42 Dizziness and giddiness: Secondary | ICD-10-CM | POA: Diagnosis not present

## 2022-11-08 DIAGNOSIS — B349 Viral infection, unspecified: Secondary | ICD-10-CM | POA: Insufficient documentation

## 2022-11-08 DIAGNOSIS — R000 Tachycardia, unspecified: Secondary | ICD-10-CM | POA: Diagnosis not present

## 2022-11-08 HISTORY — DX: Anxiety disorder, unspecified: F41.9

## 2022-11-08 LAB — CBC
HCT: 41.3 % (ref 36.0–46.0)
Hemoglobin: 13.7 g/dL (ref 12.0–15.0)
MCH: 28.4 pg (ref 26.0–34.0)
MCHC: 33.2 g/dL (ref 30.0–36.0)
MCV: 85.7 fL (ref 80.0–100.0)
Platelets: 164 10*3/uL (ref 150–400)
RBC: 4.82 MIL/uL (ref 3.87–5.11)
RDW: 12 % (ref 11.5–15.5)
WBC: 7.4 10*3/uL (ref 4.0–10.5)
nRBC: 0 % (ref 0.0–0.2)

## 2022-11-08 LAB — GROUP A STREP BY PCR: Group A Strep by PCR: NOT DETECTED

## 2022-11-08 LAB — URINALYSIS, ROUTINE W REFLEX MICROSCOPIC
Bilirubin Urine: NEGATIVE
Glucose, UA: NEGATIVE mg/dL
Hgb urine dipstick: NEGATIVE
Ketones, ur: NEGATIVE mg/dL
Nitrite: NEGATIVE
Protein, ur: NEGATIVE mg/dL
Specific Gravity, Urine: 1.025 (ref 1.005–1.030)
pH: 5 (ref 5.0–8.0)

## 2022-11-08 LAB — BASIC METABOLIC PANEL
Anion gap: 10 (ref 5–15)
BUN: 13 mg/dL (ref 6–20)
CO2: 23 mmol/L (ref 22–32)
Calcium: 8.5 mg/dL — ABNORMAL LOW (ref 8.9–10.3)
Chloride: 102 mmol/L (ref 98–111)
Creatinine, Ser: 0.89 mg/dL (ref 0.44–1.00)
GFR, Estimated: >60 mL/min (ref >60)
Glucose, Bld: 103 mg/dL — ABNORMAL HIGH (ref 70–99)
Potassium: 3.6 mmol/L (ref 3.5–5.1)
Sodium: 135 mmol/L (ref 135–145)

## 2022-11-08 LAB — POC URINE PREG, ED: Preg Test, Ur: NEGATIVE

## 2022-11-08 LAB — RESP PANEL BY RT-PCR (RSV, FLU A&B, COVID)  RVPGX2
Influenza A by PCR: NEGATIVE
Influenza B by PCR: NEGATIVE
Resp Syncytial Virus by PCR: NEGATIVE
SARS Coronavirus 2 by RT PCR: NEGATIVE

## 2022-11-08 MED ORDER — IBUPROFEN 600 MG PO TABS: 600.0000 mg | ORAL_TABLET | Freq: Four times a day (QID) | ORAL | 0 refills | Status: DC | PRN

## 2022-11-08 MED ORDER — METOCLOPRAMIDE HCL 10 MG PO TABS: 10.0000 mg | ORAL_TABLET | Freq: Three times a day (TID) | ORAL | 1 refills | Status: AC | PRN

## 2022-11-08 MED ORDER — KETOROLAC TROMETHAMINE 15 MG/ML IJ SOLN
15.0000 mg | Freq: Once | INTRAMUSCULAR | Status: AC
  Administered 2022-11-08: 15 mg via INTRAVENOUS
  Filled 2022-11-08: qty 1

## 2022-11-08 MED ORDER — SODIUM CHLORIDE 0.9 % IV BOLUS
1000.0000 mL | Freq: Once | INTRAVENOUS | Status: AC
  Administered 2022-11-08: 1000 mL via INTRAVENOUS

## 2022-11-08 MED ORDER — METOCLOPRAMIDE HCL 5 MG/ML IJ SOLN
10.0000 mg | Freq: Once | INTRAMUSCULAR | Status: AC
  Administered 2022-11-08: 10 mg via INTRAVENOUS
  Filled 2022-11-08: qty 2

## 2022-11-08 NOTE — ED Notes (Signed)
Pt. Laying in bed. This RN encouraged pt. To provide urine specimen as that was delaying other aspects of care. Pt. Reluctantly up out of bed to bathroom, gait steady, NAD.

## 2022-11-08 NOTE — ED Notes (Signed)
Pt ambulatory to bathroom with nad, speaking with provider while walking

## 2022-11-08 NOTE — Discharge Instructions (Addendum)
You likely have a viral infection although your COVID, flu, RSV, and strep tests are all negative.  You may take the Reglan if needed for nausea and/or headache as well as ibuprofen or Tylenol for the headache.    Follow-up with your primary care doctor.  Return to the ER for new, worsening, or persistent severe headache, fever, neck stiffness, weakness, vomiting, chest pain, difficulty breathing, or any other new or worsening symptoms that concern you.

## 2022-11-08 NOTE — ED Triage Notes (Signed)
Patient to ED with dizziness and SOB. Symptoms started today while at work.

## 2022-11-08 NOTE — ED Provider Notes (Signed)
[facility] Provider Note    Event Date/Time   First MD Initiated Contact with Patient 11/08/22 1606     (approximate)   History   Dizziness   HPI  April Tran is a 32 y.o. female history of depression and IBS who presents with dizziness and headache, acute onset today, associated with some shortness of breath, chest discomfort, and generalized malaise.  The patient states that she was feeling relatively well yesterday and the symptoms all started today.  The patient states that the dizziness is both lightheadedness and some sensation of spinning or movement.  It is worse when she stands up but not worse with turning her head or other changes in position.  The headache is bilateral and frontal.  It is throbbing.  The patient reports associated nausea but no vomiting.  She has not had any change in her bowel movements and has no abdominal pain.  I reviewed past medical records.  The patient's most recent outpatient encounter was with orthopedics in December of last year for a wrist injury.  She has no recent hospitalizations.   Physical Exam   Triage Vital Signs: ED Triage Vitals  Enc Vitals Group     BP 11/08/22 1455 (!) 154/103     Pulse Rate 11/08/22 1455 (!) 102     Resp 11/08/22 1455 18     Temp 11/08/22 1455 98.3 F (36.8 C)     Temp Source 11/08/22 1455 Oral     SpO2 11/08/22 1455 97 %     Weight --      Height --      Head Circumference --      Peak Flow --      Pain Score 11/08/22 1452 0     Pain Loc --      Pain Edu? --      Excl. in [place]? --     Most recent vital signs: Vitals:   11/08/22 1815 11/08/22 1900  BP:  121/68  Pulse: 77 79  Resp:  18  Temp:    SpO2: 98% 98%     General: Alert and oriented, uncomfortable appearing but in no acute distress. CV:  Good peripheral perfusion.  Normal heart sounds. Resp:  Normal effort.  Lungs CTAB. Abd:  Soft and nontender.  No distention.  Other:  Oropharynx clear with slight  erythema but no exudate.  EOMI.  PERRLA.  No nystagmus.  Mild photophobia.  Neck with full ROM.  No meningeal signs.  Motor intact in all extremities.   ED Results / Procedures / Treatments   Labs (all labs ordered are listed, but only abnormal results are displayed) Labs Reviewed  BASIC METABOLIC PANEL - Abnormal; Notable for the following components:      Result Value   Glucose, Bld 103 (*)    Calcium 8.5 (*)    All other components within normal limits  URINALYSIS, ROUTINE W REFLEX MICROSCOPIC - Abnormal; Notable for the following components:   Color, Urine YELLOW (*)    APPearance HAZY (*)    Leukocytes,Ua TRACE (*)    Bacteria, UA RARE (*)    All other components within normal limits  RESP PANEL BY RT-PCR (RSV, FLU A&B, COVID)  RVPGX2  GROUP A STREP BY PCR  CBC  POC URINE PREG, ED     EKG  ED ECG REPORT I, Arta Silence, the attending physician, personally viewed and interpreted this ECG.  Date: 11/08/2022 EKG Time: 1458 Rate: 105  Rhythm: Sinus tachycardia cardia QRS Axis: normal Intervals: normal ST/T Wave abnormalities: normal Narrative Interpretation: no evidence of acute ischemia   RADIOLOGY  Chest x-ray: I independently viewed and interpreted the images; there is no focal consolidation or edema  PROCEDURES:  Critical Care performed: No  Procedures   MEDICATIONS ORDERED IN ED: Medications  sodium chloride 0.9 % bolus 1,000 mL (0 mLs Intravenous Stopped 11/08/22 2004)  metoCLOPramide (REGLAN) injection 10 mg (10 mg Intravenous Given 11/08/22 1718)  ketorolac (TORADOL) 15 MG/ML injection 15 mg (15 mg Intravenous Given 11/08/22 1717)     IMPRESSION / MDM / ASSESSMENT AND PLAN / ED COURSE  I reviewed the triage vital signs and the nursing notes.  32 year old female with PMH as noted above presents with dizziness, headache, nausea, chest discomfort, and shortness of breath all acutely starting today.  Physical exam is unremarkable for acute  findings except for mild photophobia.  Neurologic exam is normal and the patient has no meningeal signs.  Differential diagnosis includes, but is not limited to, influenza or other viral syndrome, strep, dehydration, electrolyte abnormality, migraine headache.  EKG is nonischemic and the patient has no significant cardiac risk factors.  There is no evidence of ACS or other acute cardiac etiology.  Chest x-ray shows no evidence of pneumonia or other acute findings.  Although the patient cannot be ruled out by [facility] due to her borderline tachycardia, she has no DVT symptoms or any other PE risk factors and there is no clinical evidence for PE.  We will obtain a respiratory panel, strep swab, and treat symptomatically with fluids, Reglan, Toradol, and reassess.  Patient's presentation is most consistent with acute presentation with potential threat to life or bodily function.  ----------------------------------------- 9:42 PM on 11/08/2022 -----------------------------------------  Respiratory panel and strep swab are both negative.  On reassessment, the patient states she is feeling much better and appears comfortable.  Her tachycardia has resolved.  At this time, she is stable for discharge home.  I counseled her on the results of the workup.  I gave her strict return precautions and she expressed understanding.  FINAL CLINICAL IMPRESSION(S) / ED DIAGNOSES   Final diagnoses:  Acute nonintractable headache, unspecified headache type  Viral syndrome     Rx / DC Orders   ED Discharge Orders          Ordered    metoCLOPramide (REGLAN) 10 MG tablet  Every 8 hours PRN        11/08/22 1948    ibuprofen (ADVIL) 600 MG tablet  Every 6 hours PRN        11/08/22 1948             Note:  This document was prepared using Dragon voice recognition software and may include unintentional dictation errors.    Arta Silence, MD 11/08/22 [phone]
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
## 2023-07-02 ENCOUNTER — Emergency Department: Payer: Medicaid Other

## 2023-07-02 ENCOUNTER — Other Ambulatory Visit: Payer: Self-pay

## 2023-07-02 ENCOUNTER — Emergency Department
Admission: EM | Admit: 2023-07-02 | Discharge: 2023-07-02 | Disposition: A | Discharge status: Home / Self Care | Payer: Medicaid Other | Attending: Emergency Medicine | Admitting: Emergency Medicine

## 2023-07-02 DIAGNOSIS — Z1152 Encounter for screening for COVID-19: Secondary | ICD-10-CM | POA: Insufficient documentation

## 2023-07-02 DIAGNOSIS — J181 Lobar pneumonia, unspecified organism: Secondary | ICD-10-CM | POA: Diagnosis not present

## 2023-07-02 DIAGNOSIS — R059 Cough, unspecified: Secondary | ICD-10-CM | POA: Diagnosis present

## 2023-07-02 DIAGNOSIS — J189 Pneumonia, unspecified organism: Secondary | ICD-10-CM

## 2023-07-02 LAB — SARS CORONAVIRUS 2 BY RT PCR: SARS Coronavirus 2 by RT PCR: NEGATIVE

## 2023-07-02 MED ORDER — AZITHROMYCIN 250 MG PO TABS: ORAL_TABLET | ORAL | 0 refills | Status: DC

## 2023-07-02 MED ORDER — CEFDINIR 300 MG PO CAPS: 300.0000 mg | ORAL_CAPSULE | Freq: Two times a day (BID) | ORAL | 0 refills | Status: AC

## 2023-07-02 NOTE — ED Provider Notes (Signed)
[facility] Provider Note    Event Date/Time   First MD Initiated Contact with Patient 07/02/23 1734     (approximate)   History   Cough   HPI  April Tran is a 32 y.o. female who presents today for evaluation of cough and fever for the past 5 days.  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  She reports that she has pain in her chest when she coughs.  No hemoptysis.  No calf pain or leg swelling.  No abdominal pain, nausea, vomiting, diarrhea.  Patient Active Problem List   Diagnosis Date Noted   Gestational hypertension, third trimester 12/09/2016   Post-dates pregnancy 12/09/2016   Pregnancy 12/03/2016   Indication for care in labor and delivery, antepartum 11/26/2016   Labor and delivery, indication for care 11/07/2016   Uterine contractions during pregnancy 10/15/2016   Fall at home 07/26/2016          Physical Exam   Triage Vital Signs: ED Triage Vitals  Encounter Vitals Group     BP 07/02/23 1606 (!) 121/92     Systolic BP Percentile --      Diastolic BP Percentile --      Pulse Rate 07/02/23 1606 96     Resp 07/02/23 1606 20     Temp 07/02/23 1606 98.2 F (36.8 C)     Temp src --      SpO2 07/02/23 1606 97 %     Weight 07/02/23 1607 277 lb (125.6 kg)     Height 07/02/23 1607 5\' 6"  (1.676 m)     Head Circumference --      Peak Flow --      Pain Score 07/02/23 1607 5     Pain Loc --      Pain Education --      Exclude from Growth Chart --     Most recent vital signs: Vitals:   07/02/23 1606  BP: (!) 121/92  Pulse: 96  Resp: 20  Temp: 98.2 F (36.8 C)  SpO2: 97%    Physical Exam Vitals and nursing note reviewed.  Constitutional:      General: Awake and alert. No acute distress.    Appearance: Normal appearance. The patient is normal weight.  HENT:     Head: Normocephalic and atraumatic.     Mouth: Mucous membranes are moist.  Eyes:     General: PERRL. Normal EOMs         Right eye: No discharge.        Left eye: No discharge.     Conjunctiva/sclera: Conjunctivae normal.  Cardiovascular:     Rate and Rhythm: Normal rate and regular rhythm.     Pulses: Normal pulses.  Pulmonary:     Effort: Pulmonary effort is normal. No respiratory distress.  No tachypnea or accessory muscle use.    Breath sounds:  Rhonchi left lower lobe Abdominal:     Abdomen is soft. There is no abdominal tenderness. No rebound or guarding. No distention. Musculoskeletal:        General: No swelling. Normal range of motion.     Cervical back: Normal range of motion and neck supple.  Skin:    General: Skin is warm and dry.     Capillary Refill: Capillary refill takes less than 2 seconds.     Findings: No rash.  Neurological:     Mental Status: The patient is awake and alert.  ED Results / Procedures / Treatments   Labs (all labs ordered are listed, but only abnormal results are displayed) Labs Reviewed  SARS CORONAVIRUS 2 BY RT PCR     EKG     RADIOLOGY I independently reviewed and interpreted imaging and agree with radiologists findings.     PROCEDURES:  Critical Care performed:   Procedures   MEDICATIONS ORDERED IN ED: Medications - No data to display   IMPRESSION / MDM / ASSESSMENT AND PLAN / ED COURSE  I reviewed the triage vital signs and the nursing notes.   Differential diagnosis includes, but is not limited to, COVID-19, viral URI, bronchitis, pneumonia.  Patient is awake and alert, hemodynamically stable and afebrile.  She is nontoxic in appearance.  No hypoxia.  She is able to speak easily in complete sentences.  COVID swab obtained in triage is negative.  Given her continued symptoms, will also obtain chest x-ray for evaluation of pneumonia.  No chest pain at rest, no shortness of breath, no pleurisy, no hypoxia no calf pain or leg swelling, no history of PE or DVT, not consistent with pulmonary embolism.  No chest pain at rest or  dyspnea on exertion, no nausea or diaphoresis, not consistent with acute coronary syndrome.  XR reveals a left lower lobe pneumonia.  She has normal saturation on room air, no shortness of breath, no hemoptysis, do not feel that she requires inpatient treatment at this time.  She was started on cefdinir and azithromycin for community-acquired pneumonia.  She was given a work note as requested.  We discussed return precautions and outpatient follow-up.  Patient understands and agrees with plan.  She was discharged in stable condition.  Patient's presentation is most consistent with acute complicated illness / injury requiring diagnostic workup.   FINAL CLINICAL IMPRESSION(S) / ED DIAGNOSES   Final diagnoses:  Community acquired pneumonia of left lower lobe of lung     Rx / DC Orders   ED Discharge Orders          Ordered    cefdinir (OMNICEF) 300 MG capsule  2 times daily        07/02/23 1946    azithromycin (ZITHROMAX Z-PAK) 250 MG tablet        07/02/23 1946             Note:  This document was prepared using Dragon voice recognition software and may include unintentional dictation errors.   Keturah Shavers 07/02/23 2028    Trinna Post, MD 07/04/23 [phone]

## 2023-07-02 NOTE — Discharge Instructions (Signed)
Your x-ray shows pneumonia.  Please take the antibiotics as prescribed.  Your COVID test is negative.  Please return for any new, worsening, or change in symptoms or other concerns.  It was a pleasure caring for you today.

## 2023-07-02 NOTE — ED Triage Notes (Signed)
Pt to ED for cough and fever for a few days. Was dx with virus and given cough infection. Tested neg for covid. +sore throat, body aches and headache

## 2023-07-04 ENCOUNTER — Emergency Department
Admission: EM | Admit: 2023-07-04 | Discharge: 2023-07-04 | Discharge status: Against Medical Advice | Payer: Medicaid Other | Attending: Emergency Medicine | Admitting: Emergency Medicine

## 2023-07-04 DIAGNOSIS — R1032 Left lower quadrant pain: Secondary | ICD-10-CM | POA: Diagnosis present

## 2023-07-04 DIAGNOSIS — Z5321 Procedure and treatment not carried out due to patient leaving prior to being seen by health care provider: Secondary | ICD-10-CM | POA: Insufficient documentation

## 2023-07-04 LAB — COMPREHENSIVE METABOLIC PANEL
ALT: 40 U/L (ref 0–44)
AST: 35 U/L (ref 15–41)
Albumin: 3.4 g/dL — ABNORMAL LOW (ref 3.5–5.0)
Alkaline Phosphatase: 60 U/L (ref 38–126)
Anion gap: 7 (ref 5–15)
BUN: 11 mg/dL (ref 6–20)
CO2: 26 mmol/L (ref 22–32)
Calcium: 8.7 mg/dL — ABNORMAL LOW (ref 8.9–10.3)
Chloride: 103 mmol/L (ref 98–111)
Creatinine, Ser: 0.86 mg/dL (ref 0.44–1.00)
GFR, Estimated: >60 mL/min (ref >60)
Glucose, Bld: 168 mg/dL — ABNORMAL HIGH (ref 70–99)
Potassium: 3.5 mmol/L (ref 3.5–5.1)
Sodium: 136 mmol/L (ref 135–145)
Total Bilirubin: 0.4 mg/dL (ref 0.3–1.2)
Total Protein: 7.2 g/dL (ref 6.5–8.1)

## 2023-07-04 LAB — URINALYSIS, ROUTINE W REFLEX MICROSCOPIC
Bilirubin Urine: NEGATIVE
Glucose, UA: NEGATIVE mg/dL
Hgb urine dipstick: NEGATIVE
Ketones, ur: NEGATIVE mg/dL
Leukocytes,Ua: NEGATIVE
Nitrite: NEGATIVE
Protein, ur: NEGATIVE mg/dL
Specific Gravity, Urine: 1.004 — ABNORMAL LOW (ref 1.005–1.030)
pH: 6 (ref 5.0–8.0)

## 2023-07-04 LAB — CBC
HCT: 37.2 % (ref 36.0–46.0)
Hemoglobin: 12.5 g/dL (ref 12.0–15.0)
MCH: 28 pg (ref 26.0–34.0)
MCHC: 33.6 g/dL (ref 30.0–36.0)
MCV: 83.4 fL (ref 80.0–100.0)
Platelets: 190 10*3/uL (ref 150–400)
RBC: 4.46 MIL/uL (ref 3.87–5.11)
RDW: 12.6 % (ref 11.5–15.5)
WBC: 5.5 10*3/uL (ref 4.0–10.5)
nRBC: 0 % (ref 0.0–0.2)

## 2023-07-04 LAB — PREGNANCY, URINE: Preg Test, Ur: NEGATIVE

## 2023-07-04 LAB — LIPASE, BLOOD: Lipase: 27 U/L (ref 11–51)

## 2023-07-04 NOTE — ED Notes (Signed)
No answer when called several times from lobby 

## 2023-07-04 NOTE — ED Triage Notes (Signed)
Pt recently diagnosed with PNA and given antibiotics. Pt developed left lower ABD pain today and was recommended to come in by on call nurse

## 2023-08-16 ENCOUNTER — Emergency Department
Admission: EM | Admit: 2023-08-16 | Discharge: 2023-08-16 | Disposition: A | Discharge status: Home / Self Care | Payer: Medicaid Other | Attending: Emergency Medicine | Admitting: Emergency Medicine

## 2023-08-16 ENCOUNTER — Other Ambulatory Visit: Payer: Self-pay

## 2023-08-16 DIAGNOSIS — R300 Dysuria: Secondary | ICD-10-CM | POA: Insufficient documentation

## 2023-08-16 DIAGNOSIS — R109 Unspecified abdominal pain: Secondary | ICD-10-CM | POA: Diagnosis present

## 2023-08-16 DIAGNOSIS — M791 Myalgia, unspecified site: Secondary | ICD-10-CM | POA: Insufficient documentation

## 2023-08-16 DIAGNOSIS — R197 Diarrhea, unspecified: Secondary | ICD-10-CM | POA: Diagnosis not present

## 2023-08-16 DIAGNOSIS — M7918 Myalgia, other site: Secondary | ICD-10-CM

## 2023-08-16 LAB — CBC
HCT: 39.4 % (ref 36.0–46.0)
Hemoglobin: 12.9 g/dL (ref 12.0–15.0)
MCH: 28.4 pg (ref 26.0–34.0)
MCHC: 32.7 g/dL (ref 30.0–36.0)
MCV: 86.8 fL (ref 80.0–100.0)
Platelets: 170 10*3/uL (ref 150–400)
RBC: 4.54 MIL/uL (ref 3.87–5.11)
RDW: 13.6 % (ref 11.5–15.5)
WBC: 5 10*3/uL (ref 4.0–10.5)
nRBC: 0 % (ref 0.0–0.2)

## 2023-08-16 LAB — BASIC METABOLIC PANEL
Anion gap: 8 (ref 5–15)
BUN: 17 mg/dL (ref 6–20)
CO2: 25 mmol/L (ref 22–32)
Calcium: 9.1 mg/dL (ref 8.9–10.3)
Chloride: 104 mmol/L (ref 98–111)
Creatinine, Ser: 0.88 mg/dL (ref 0.44–1.00)
GFR, Estimated: >60 mL/min (ref >60)
Glucose, Bld: 121 mg/dL — ABNORMAL HIGH (ref 70–99)
Potassium: 3.6 mmol/L (ref 3.5–5.1)
Sodium: 137 mmol/L (ref 135–145)

## 2023-08-16 LAB — POC URINE PREG, ED: Preg Test, Ur: NEGATIVE

## 2023-08-16 LAB — URINALYSIS, ROUTINE W REFLEX MICROSCOPIC
Bacteria, UA: NONE SEEN
Bilirubin Urine: NEGATIVE
Glucose, UA: NEGATIVE mg/dL
Hgb urine dipstick: NEGATIVE
Ketones, ur: NEGATIVE mg/dL
Nitrite: NEGATIVE
Protein, ur: NEGATIVE mg/dL
RBC / HPF: 0 RBC/hpf (ref 0–5)
Specific Gravity, Urine: 1.024 (ref 1.005–1.030)
pH: 5 (ref 5.0–8.0)

## 2023-08-16 LAB — LIPASE, BLOOD: Lipase: 40 U/L (ref 11–51)

## 2023-08-16 MED ORDER — METHOCARBAMOL 500 MG PO TABS: 500.0000 mg | ORAL_TABLET | Freq: Four times a day (QID) | ORAL | 0 refills | Status: AC | PRN

## 2023-08-16 MED ORDER — KETOROLAC TROMETHAMINE 30 MG/ML IJ SOLN
30.0000 mg | Freq: Once | INTRAMUSCULAR | Status: AC
  Administered 2023-08-16: 30 mg via INTRAMUSCULAR
  Filled 2023-08-16: qty 1

## 2023-08-16 MED ORDER — IBUPROFEN 600 MG PO TABS: 600.0000 mg | ORAL_TABLET | Freq: Three times a day (TID) | ORAL | 0 refills | Status: AC | PRN

## 2023-08-16 MED ORDER — HYDROCODONE-ACETAMINOPHEN 5-325 MG PO TABS: 1.0000 | ORAL_TABLET | Freq: Four times a day (QID) | ORAL | 0 refills | Status: AC | PRN

## 2023-08-16 NOTE — Discharge Instructions (Signed)
Follow-up with your primary care provider if any continued problems or concerns.  Lab work and urinalysis today were negative for infection or suggestive of a kidney stone.  You may use ice or heat to your muscle area as needed for discomfort.  3 prescriptions were sent to the pharmacy.  The ibuprofen every 8 hours can be taken with food and will not cause drowsiness.  The methocarbamol and hydrocodone could cause drowsiness and should not be taken if you plan to drive or operate machinery.

## 2023-08-16 NOTE — ED Triage Notes (Signed)
Pt to ED for left flank pain x2 weeks. Reports urinary and bowel sx. States diarrhea is chronic for her. NAD noted

## 2023-08-16 NOTE — ED Provider Notes (Signed)
[facility] Provider Note    Event Date/Time   First MD Initiated Contact with Patient 08/16/23 [phone]     (approximate)   History   Flank Pain   HPI  April Tran is a 32 y.o. female   presents to the ED with complaint of left lower back pain for the last 2 weeks without known history of injury.  She reports some dysuria but no hematuria and no prior history of stones.  Patient reports chronic diarrhea.  She denies any fever, chills, nausea or vomiting.      Physical Exam   Triage Vital Signs: ED Triage Vitals [08/16/23 0806]  Encounter Vitals Group     BP 139/88     Systolic BP Percentile      Diastolic BP Percentile      Pulse Rate 92     Resp 18     Temp 97.8 F (36.6 C)     Temp src      SpO2 100 %     Weight 245 lb (111.1 kg)     Height 5\' 6"  (1.676 m)     Head Circumference      Peak Flow      Pain Score 7     Pain Loc      Pain Education      Exclude from Growth Chart     Most recent vital signs: Vitals:   08/16/23 0806  BP: 139/88  Pulse: 92  Resp: 18  Temp: 97.8 F (36.6 C)  SpO2: 100%     General: Awake, no distress.  Appears to be uncomfortable on stretcher. CV:  Good peripheral perfusion.  Heart rate and rate rhythm. Resp:  Normal effort.  Clear bilaterally. Abd:  No distention.  Soft, nontender, no CVA tenderness to percussion. Other:  On examination of the back there is no gross deformity or point tenderness on palpation of the thoracic or lumbar spine.  There is however moderate tenderness on palpation of the muscles over the left hip and SI joint area.  Range of motion is slow and guarded secondary to discomfort.  Patient is able to stand and ambulate without any assistance.   ED Results / Procedures / Treatments   Labs (all labs ordered are listed, but only abnormal results are displayed) Labs Reviewed  URINALYSIS, ROUTINE W REFLEX MICROSCOPIC - Abnormal; Notable for the following components:       Result Value   Color, Urine YELLOW (*)    APPearance HAZY (*)    Leukocytes,Ua TRACE (*)    All other components within normal limits  BASIC METABOLIC PANEL - Abnormal; Notable for the following components:   Glucose, Bld 121 (*)    All other components within normal limits  CBC  LIPASE, BLOOD  POC URINE PREG, ED      PROCEDURES:  Critical Care performed:   Procedures   MEDICATIONS ORDERED IN ED: Medications  ketorolac (TORADOL) 30 MG/ML injection 30 mg (30 mg Intramuscular Given 08/16/23 1037)     IMPRESSION / MDM / ASSESSMENT AND PLAN / ED COURSE  I reviewed the triage vital signs and the nursing notes.   Differential diagnosis includes, but is not limited to, muscle skeletal pain, muscle skeletal strain, UTI, urolithiasis, low back pain.  32 year old female presents to the ED with complaint of left sided back pain for approximately 2 weeks without known history of injury.  No previous urinary history.  Urinalysis was negative.  On exam  patient has more muscle skeletal tenderness.  Lab work was reassuring.  Patient was given Toradol 30 mg IM IM while in the ED.  A prescription for methocarbamol, ibuprofen and hydrocodone was sent to the pharmacy for her to begin using.  Ice or heat to her muscles as needed for discomfort.  She is to follow-up with her PCP if any continued problems.      Patient's presentation is most consistent with acute complicated illness / injury requiring diagnostic workup.  FINAL CLINICAL IMPRESSION(S) / ED DIAGNOSES   Final diagnoses:  Musculoskeletal pain     Rx / DC Orders   ED Discharge Orders          Ordered    methocarbamol (ROBAXIN) 500 MG tablet  Every 6 hours PRN        08/16/23 1035    ibuprofen (ADVIL) 600 MG tablet  Every 8 hours PRN        08/16/23 1035    HYDROcodone-acetaminophen (NORCO/VICODIN) 5-325 MG tablet  Every 6 hours PRN        08/16/23 1035             Note:  This document was prepared using Dragon  voice recognition software and may include unintentional dictation errors.   Tommi Rumps, PA-C 08/16/23 1246    Jene Every, MD 08/16/23 1346

## 2023-08-19 ENCOUNTER — Other Ambulatory Visit: Payer: Self-pay

## 2023-08-19 ENCOUNTER — Encounter: Payer: Self-pay | Admitting: Emergency Medicine

## 2023-08-19 DIAGNOSIS — K625 Hemorrhage of anus and rectum: Secondary | ICD-10-CM | POA: Insufficient documentation

## 2023-08-19 DIAGNOSIS — N83202 Unspecified ovarian cyst, left side: Secondary | ICD-10-CM | POA: Diagnosis not present

## 2023-08-19 DIAGNOSIS — R161 Splenomegaly, not elsewhere classified: Secondary | ICD-10-CM | POA: Diagnosis not present

## 2023-08-19 DIAGNOSIS — Z9049 Acquired absence of other specified parts of digestive tract: Secondary | ICD-10-CM | POA: Diagnosis not present

## 2023-08-19 LAB — COMPREHENSIVE METABOLIC PANEL
ALT: 22 U/L (ref 0–44)
AST: 21 U/L (ref 15–41)
Albumin: 4.2 g/dL (ref 3.5–5.0)
Alkaline Phosphatase: 54 U/L (ref 38–126)
Anion gap: 8 (ref 5–15)
BUN: 14 mg/dL (ref 6–20)
CO2: 28 mmol/L (ref 22–32)
Calcium: 9 mg/dL (ref 8.9–10.3)
Chloride: 102 mmol/L (ref 98–111)
Creatinine, Ser: 0.93 mg/dL (ref 0.44–1.00)
GFR, Estimated: >60 mL/min (ref >60)
Glucose, Bld: 120 mg/dL — ABNORMAL HIGH (ref 70–99)
Potassium: 3.4 mmol/L — ABNORMAL LOW (ref 3.5–5.1)
Sodium: 138 mmol/L (ref 135–145)
Total Bilirubin: 0.6 mg/dL (ref <1.2)
Total Protein: 7.8 g/dL (ref 6.5–8.1)

## 2023-08-19 LAB — CBC
HCT: 37.5 % (ref 36.0–46.0)
Hemoglobin: 12.6 g/dL (ref 12.0–15.0)
MCH: 28.5 pg (ref 26.0–34.0)
MCHC: 33.6 g/dL (ref 30.0–36.0)
MCV: 84.8 fL (ref 80.0–100.0)
Platelets: 164 10*3/uL (ref 150–400)
RBC: 4.42 MIL/uL (ref 3.87–5.11)
RDW: 13.4 % (ref 11.5–15.5)
WBC: 8.4 10*3/uL (ref 4.0–10.5)
nRBC: 0 % (ref 0.0–0.2)

## 2023-08-19 NOTE — ED Triage Notes (Signed)
Patient reports [facility] that she noticed tonight after having a bowel movement.  Patient also endorses pain in her rectum.

## 2023-08-19 NOTE — ED Notes (Addendum)
Unable to obtain type and screen.  Lab notified.

## 2023-08-20 ENCOUNTER — Emergency Department: Payer: Medicaid Other

## 2023-08-20 ENCOUNTER — Emergency Department
Admission: EM | Admit: 2023-08-20 | Discharge: 2023-08-20 | Disposition: A | Discharge status: Home / Self Care | Payer: Medicaid Other | Attending: Emergency Medicine | Admitting: Emergency Medicine

## 2023-08-20 DIAGNOSIS — Z9049 Acquired absence of other specified parts of digestive tract: Secondary | ICD-10-CM | POA: Diagnosis not present

## 2023-08-20 DIAGNOSIS — N83202 Unspecified ovarian cyst, left side: Secondary | ICD-10-CM | POA: Diagnosis not present

## 2023-08-20 DIAGNOSIS — K625 Hemorrhage of anus and rectum: Secondary | ICD-10-CM

## 2023-08-20 DIAGNOSIS — R161 Splenomegaly, not elsewhere classified: Secondary | ICD-10-CM | POA: Diagnosis not present

## 2023-08-20 LAB — TYPE AND SCREEN
ABO/RH(D): A POS
Antibody Screen: NEGATIVE

## 2023-08-20 LAB — POC URINE PREG, ED: Preg Test, Ur: NEGATIVE

## 2023-08-20 MED ORDER — IOHEXOL 300 MG/ML  SOLN
100.0000 mL | Freq: Once | INTRAMUSCULAR | Status: AC | PRN
  Administered 2023-08-20: 100 mL via INTRAVENOUS

## 2023-08-20 NOTE — ED Provider Notes (Signed)
[facility] Provider Note    Event Date/Time   First MD Initiated Contact with Patient 08/20/23 0106     (approximate)   History   Rectal Bleeding   HPI  April Tran is a 32 y.o. female who presents to the ED from home with a chief complaint of BRBPR tonight after having a bowel movement.  Endorses rectal pressure.  Denies anticoagulant use.  Denies fever/chills, chest pain, shortness of breath, abdominal pain, nausea, vomiting, constipation or diarrhea.  Strong family history of bowel issues, in particular IBD.  Denies recent travel or camping.     Past Medical History   Past Medical History:  Diagnosis Date   Anxiety    Depression      Active Problem List   Patient Active Problem List   Diagnosis Date Noted   Gestational hypertension, third trimester 12/09/2016   Post-dates pregnancy 12/09/2016   Pregnancy 12/03/2016   Indication for care in labor and delivery, antepartum 11/26/2016   Labor and delivery, indication for care 11/07/2016   Uterine contractions during pregnancy 10/15/2016   Fall at home 07/26/2016     Past Surgical History   Past Surgical History:  Procedure Laterality Date   CHOLECYSTECTOMY     TONSILLECTOMY     TUBAL LIGATION Bilateral 12/10/2016   Procedure: BILATERAL TUBAL LIGATION;  Surgeon: Elenora Fender Ward, MD;  Location: [facility] ORS;  Service: Gynecology;  Laterality: Bilateral;   TUBAL LIGATION     WISDOM TOOTH EXTRACTION       Home Medications   Prior to Admission medications   Medication Sig Start Date End Date Taking? Authorizing Provider  gabapentin (NEURONTIN) 300 MG capsule Take 1 capsule (300 mg total) by mouth 3 (three) times daily. 01/05/21   Pabon, Merri Ray, MD  HYDROcodone-acetaminophen (NORCO/VICODIN) 5-325 MG tablet Take 1 tablet by mouth every 6 (six) hours as needed. 08/16/23 08/15/24  Tommi Rumps, PA-C  ibuprofen (ADVIL) 600 MG tablet Take 1 tablet (600 mg total) by mouth every 8 (eight)  hours as needed. With food 08/16/23   Tommi Rumps, PA-C  methocarbamol (ROBAXIN) 500 MG tablet Take 1 tablet (500 mg total) by mouth every 6 (six) hours as needed. 08/16/23   Tommi Rumps, PA-C  metoCLOPramide (REGLAN) 10 MG tablet Take 1 tablet (10 mg total) by mouth every 8 (eight) hours as needed for nausea (headache). 11/08/22 11/08/23  Dionne Bucy, MD  sertraline (ZOLOFT) 100 MG tablet Take 100 mg by mouth at bedtime as needed (depression).    [provider]  traZODone (DESYREL) 50 MG tablet Take 50 mg by mouth at bedtime.    [provider]     Allergies  Coconut (cocos nucifera) and Morphine and codeine   Family History   Family History  Problem Relation Age of Onset   Hypertension Mother    Depression Mother    Hypertension Father    Diabetes Father    Depression Father    Hypertension Maternal Aunt    Depression Maternal Aunt    Hypertension Paternal Aunt    Hypertension Maternal Grandmother    Depression Maternal Grandmother    Hypertension Maternal Grandfather    Depression Maternal Grandfather    Heart disease Maternal Grandfather    Hypertension Paternal Grandmother    Depression Paternal Grandmother    Hypertension Paternal Grandfather    Depression Paternal Grandfather      Physical Exam  Triage Vital Signs: ED Triage Vitals [08/19/23 2101]  Encounter Vitals Group     BP 136/89     Systolic BP Percentile      Diastolic BP Percentile      Pulse Rate 98     Resp 18     Temp 97.9 F (36.6 C)     Temp Source Oral     SpO2 100 %     Weight 265 lb (120.2 kg)     Height      Head Circumference      Peak Flow      Pain Score 7     Pain Loc      Pain Education      Exclude from Growth Chart     Updated Vital Signs: BP (!) 141/83 (BP Location: Left Arm)   Pulse 80   Temp 98.3 F (36.8 C) (Oral)   Resp 12   Wt 120.2 kg   LMP 07/14/2023   SpO2 100%   BMI 42.77 kg/m    General: Awake, no distress.  CV:  RRR.   Good peripheral perfusion.  Resp:  Normal effort.  CTAB. Abd:  Nontender to light or deep palpation.  No distention.  Other:  No truncal vesicles.   ED Results / Procedures / Treatments  Labs (all labs ordered are listed, but only abnormal results are displayed) Labs Reviewed  COMPREHENSIVE METABOLIC PANEL - Abnormal; Notable for the following components:      Result Value   Potassium 3.4 (*)    Glucose, Bld 120 (*)    All other components within normal limits  POC URINE PREG, ED - Normal  CBC  POC OCCULT BLOOD, ED  TYPE AND SCREEN     EKG  None   RADIOLOGY I have independently visualized and interpreted patient's imaging study as well as noted the radiology interpretation:  CT abdomen/pelvis: No acute findings, simple left ovarian cyst  Official radiology report(s): CT ABDOMEN PELVIS W CONTRAST  Result Date: 08/20/2023 CLINICAL DATA:  Right red blood per rectum after bowel movement. Rectal pain peer EXAM: CT ABDOMEN AND PELVIS WITH CONTRAST TECHNIQUE: Multidetector CT imaging of the abdomen and pelvis was performed using the standard protocol following bolus administration of intravenous contrast. RADIATION DOSE REDUCTION: This exam was performed according to the departmental dose-optimization program which includes automated exposure control, adjustment of the mA and/or kV according to patient size and/or use of iterative reconstruction technique. CONTRAST:  OMNIPAQUE IOHEXOL 300 MG/ML  SOLN COMPARISON:  . 02/02/2022 FINDINGS: Lower chest: No acute abnormality Hepatobiliary: No focal liver abnormality is seen. Status post cholecystectomy. No biliary dilatation. Pancreas: No focal abnormality or ductal dilatation. Spleen: Splenomegaly with a craniocaudal length of 13.4 cm, similar to prior study. No focal hepatic abnormality. Adrenals/Urinary Tract: No adrenal abnormality. No focal renal abnormality. No stones or hydronephrosis. Urinary bladder is unremarkable.  Stomach/Bowel: Normal appendix. Stomach, large and small bowel grossly unremarkable. Vascular/Lymphatic: No evidence of aneurysm or adenopathy. Reproductive: Uterus and right adnexa unremarkable. 3.2 cm cyst in the left ovary. Other: No free fluid or free air. Musculoskeletal: No acute bony abnormality. IMPRESSION: Mild splenomegaly, similar to prior study. No acute findings in the abdomen or pelvis. 3.2 cm left ovarian cyst or dominant follicle. No follow-up imaging recommended. Note: This recommendation does not apply to premenarchal patients and to those with increased risk (genetic, family history, elevated tumor markers or other high-risk factors) of ovarian cancer. Reference: JACR 2020 Feb; 17(2):248-254 Electronically Signed   By: Charlett Nose M.D.   On:  08/20/2023 03:45     PROCEDURES:  Critical Care performed: No  Procedures  Rectal exam: External exam unremarkable.  Tan stool obtained on gloved finger which is guaiac positive.  MEDICATIONS ORDERED IN ED: Medications  iohexol (OMNIPAQUE) 300 MG/ML solution 100 mL (100 mLs Intravenous Contrast Given 08/20/23 0333)     IMPRESSION / MDM / ASSESSMENT AND PLAN / ED COURSE  I reviewed the triage vital signs and the nursing notes.                             32 year old female presenting with rectal bleed. Differential diagnosis includes, but is not limited to, ovarian cyst, ovarian torsion, acute appendicitis, diverticulitis, urinary tract infection/pyelonephritis, endometriosis, bowel obstruction, colitis, renal colic, gastroenteritis, hernia, fibroids, endometriosis, pregnancy related pain including ectopic pregnancy, etc. I personally reviewed patient's records and note an orthopedic surgery office visit on 06/29/2023 for chronic right wrist pain.  Patient's presentation is most consistent with acute illness / injury with system symptoms.  Laboratory results demonstrate normal hemoglobin 12.6, unremarkable electrolytes.  Discussed with  patient and offered CT abdomen/pelvis which she would like.  Will reassess.  Clinical Course as of 08/20/23 0436  Sat Aug 20, 2023  1610 CT demonstrates no acute intra-abdominal abnormality, left ovarian cyst.  Will refer patient to GI for outpatient follow-up.  Strict return precautions given.  Patient verbalizes understanding and agrees with plan of care. [JS]    Clinical Course User Index [JS] Irean Hong, MD     FINAL CLINICAL IMPRESSION(S) / ED DIAGNOSES   Final diagnoses:  Rectal bleeding  Cyst of left ovary     Rx / DC Orders   ED Discharge Orders     None        Note:  This document was prepared using Dragon voice recognition software and may include unintentional dictation errors.   Irean Hong, MD 08/20/23 [phone]

## 2023-08-20 NOTE — Discharge Instructions (Signed)
Please call the number above to schedule an appointment to follow-up with GI specialty.  Return to the ER for worsening symptoms, persistent vomiting, difficulty breathing or other concerns.

## 2024-04-26 ENCOUNTER — Emergency Department
Admission: EM | Admit: 2024-04-26 | Discharge: 2024-04-26 | Disposition: A | Discharge status: Home / Self Care | Attending: Emergency Medicine | Admitting: Emergency Medicine

## 2024-04-26 ENCOUNTER — Other Ambulatory Visit: Payer: Self-pay

## 2024-04-26 DIAGNOSIS — R519 Headache, unspecified: Secondary | ICD-10-CM | POA: Diagnosis present

## 2024-04-26 DIAGNOSIS — G43909 Migraine, unspecified, not intractable, without status migrainosus: Secondary | ICD-10-CM | POA: Diagnosis not present

## 2024-04-26 MED ORDER — KETOROLAC TROMETHAMINE 15 MG/ML IJ SOLN
15.0000 mg | Freq: Once | INTRAMUSCULAR | Status: AC
  Administered 2024-04-26: 15 mg via INTRAVENOUS
  Filled 2024-04-26: qty 1

## 2024-04-26 MED ORDER — METOCLOPRAMIDE HCL 5 MG/ML IJ SOLN
10.0000 mg | Freq: Once | INTRAMUSCULAR | Status: AC
  Administered 2024-04-26: 10 mg via INTRAVENOUS
  Filled 2024-04-26: qty 2

## 2024-04-26 MED ORDER — DIPHENHYDRAMINE HCL 50 MG/ML IJ SOLN
25.0000 mg | Freq: Once | INTRAMUSCULAR | Status: AC
  Administered 2024-04-26: 25 mg via INTRAVENOUS
  Filled 2024-04-26: qty 1

## 2024-04-26 MED ORDER — SODIUM CHLORIDE 0.9 % IV BOLUS
1000.0000 mL | Freq: Once | INTRAVENOUS | Status: AC
Start: 2024-04-26 — End: 2024-04-26
  Administered 2024-04-26: 1000 mL via INTRAVENOUS

## 2024-04-26 NOTE — ED Provider Notes (Signed)
 [facility] Provider Note    Event Date/Time   First MD Initiated Contact with Patient 04/26/24 1609     (approximate)   History   Headache   HPI  April Tran is a 33 y.o. female with with a history of depression and IBS who presents with headache over the last 3 weeks.  The patient states that the headache has been present every day although it waxes and wanes in intensity.  It is mainly on the left side of her head.  It is associated with photophobia and nausea but no vomiting.  It got acutely worse over the last day.  The patient has been taking sumatriptan without relief.  She denies any head trauma.  She has no fever or chills.  She states that she has had migraines previously, although this 1 is more intense and prolonged.  I reviewed the past medical records.  The patient's most recent prior encounter was at the [facility] ED in December of last year with blood in her stool.   Physical Exam   Triage Vital Signs: ED Triage Vitals  Encounter Vitals Group     BP 04/26/24 1417 (!) 159/110     Girls Systolic BP Percentile --      Girls Diastolic BP Percentile --      Boys Systolic BP Percentile --      Boys Diastolic BP Percentile --      Pulse Rate 04/26/24 1417 (!) 107     Resp 04/26/24 1417 18     Temp 04/26/24 1417 98.1 F (36.7 C)     Temp Source 04/26/24 1417 Oral     SpO2 04/26/24 1417 98 %     Weight 04/26/24 1415 261 lb (118.4 kg)     Height 04/26/24 1415 5' 6 (1.676 m)     Head Circumference --      Peak Flow --      Pain Score 04/26/24 1415 10     Pain Loc --      Pain Education --      Exclude from Growth Chart --     Most recent vital signs: Vitals:   04/26/24 1417 04/26/24 1810  BP: (!) 159/110 (!) 113/52  Pulse: (!) 107 76  Resp: 18 16  Temp: 98.1 F (36.7 C) 97.8 F (36.6 C)  SpO2: 98% 100%    General: Alert, well-appearing, no distress.  CV:  Good peripheral perfusion. Resp:  Normal effort.  Abd:  No  distention.  Other:  EOMI.  PERRLA.  Photophobia.  No facial droop.  Normal speech.  No ataxia.  No pronator drift.  Motor intact in all extremities.  Neck with full ROM.  No meningeal signs.   ED Results / Procedures / Treatments   Labs (all labs ordered are listed, but only abnormal results are displayed) Labs Reviewed - No data to display   EKG    RADIOLOGY    PROCEDURES:  Critical Care performed: No  Procedures   MEDICATIONS ORDERED IN ED: Medications  metoCLOPramide  (REGLAN ) injection 10 mg (10 mg Intravenous Given 04/26/24 1654)  diphenhydrAMINE  (BENADRYL ) injection 25 mg (25 mg Intravenous Given 04/26/24 1652)  ketorolac  (TORADOL ) 15 MG/ML injection 15 mg (15 mg Intravenous Given 04/26/24 1653)  sodium chloride  0.9 % bolus 1,000 mL (0 mLs Intravenous Stopped 04/26/24 1817)  ketorolac  (TORADOL ) 15 MG/ML injection 15 mg (15 mg Intravenous Given 04/26/24 1927)  metoCLOPramide  (REGLAN ) injection 10 mg (10 mg Intravenous Given 04/26/24  1926)     IMPRESSION / MDM / ASSESSMENT AND PLAN / ED COURSE  I reviewed the triage vital signs and the nursing notes.  33 year old female with PMH as noted above presents with a headache over the last 3 weeks with migraine type features including nausea and photophobia.  It has not been relieved with sumatriptan.  On exam the patient is overall well-appearing.  The neurologic exam is normal.  There are no meningeal signs.  Differential diagnosis includes, but is not limited to, migraine headache, tension, cluster, other headache syndrome.  There is no evidence of meningitis.  There is no evidence of SAH.  Based on the patient's age, the duration of the symptoms, normal neurologic exam, and the prior headache history, there is no indication for imaging at this time.  We will give a migraine cocktail and reassess.  Patient's presentation is most consistent with acute, uncomplicated illness.  ----------------------------------------- 8:14 PM on  04/26/2024 -----------------------------------------  The patient's headache significantly proved with the initial dose of Toradol  and Reglan .  We gave a second dose and the symptoms have improved further.  The patient feels a lot better and would like to go home.  She is stable for discharge at this time.  I gave her strict return precautions and she expressed understanding.  I have also provided neurology referral.   FINAL CLINICAL IMPRESSION(S) / ED DIAGNOSES   Final diagnoses:  Migraine without status migrainosus, not intractable, unspecified migraine type     Rx / DC Orders   ED Discharge Orders     None        Note:  This document was prepared using Dragon voice recognition software and may include unintentional dictation errors.    Jacolyn Pae, MD 04/26/24 2015

## 2024-04-26 NOTE — ED Triage Notes (Signed)
 Pt sts that her PCP has given her meds for migraines that she has been having for the last several days. Pt sts that she has not seen a neurologist.

## 2024-04-26 NOTE — ED Notes (Signed)
 Pt has been having migraines every day for the last 3 weeks. Saw PCP 1 wk ago and was given sumatriptan and amitriptyline per 1 wk with no relief. Pt states HA got worse today and decided she couldn't deal with it any more and decided to come get seen. Pt states that they have been staying hydrated and drinks about 1 gal/day.

## 2024-04-26 NOTE — ED Notes (Signed)
 Pt ambulated to toilet in the hallway unassisted from staff. Pt has a steady, even gait.

## 2024-04-26 NOTE — Discharge Instructions (Signed)
 Continue taking the sumatriptan as prescribed.  Follow-up with your primary care provider.  We have also given you referral information for one of our neurologists arrange for follow-up.  Return to the ER immediately for new, worsening, or persistent severe headache, vision changes, vomiting, weakness or numbness, or any other new or worsening symptoms that concern you.

## 2024-04-26 NOTE — ED Provider Triage Note (Signed)
 Emergency Medicine Provider Triage Evaluation Note  April Tran , a 33 y.o. female  was evaluated in triage.  Pt complains of migraine x3 weeks. Has never seen neurologist. +nausea, no vomiting. Left sided. +photosensitivity. Feels same as usual headaches.  Review of Systems  Positive: headache Negative: vomiting  Physical Exam  There were no vitals taken for this visit. Gen:   Awake, tearful Resp:  Normal effort  MSK:   Moves extremities without difficulty  Other:    Medical Decision Making  Medically screening exam initiated at 2:14 PM.  Appropriate orders placed.  April Tran was informed that the remainder of the evaluation will be completed by another provider, this initial triage assessment does not replace that evaluation, and the importance of remaining in the ED until their evaluation is complete.     Mallie Linnemann E, PA-C 04/26/24 1416
